# Patient Record
Sex: Female | Born: 1995 | Race: Black or African American | Hispanic: No | Marital: Single | Smoking: Never smoker
Health system: Southern US, Community
[De-identification: ages and names within clinical notes are randomized; demographics above are authoritative.]

## PROBLEM LIST (undated history)

## (undated) ENCOUNTER — Ambulatory Visit (HOSPITAL_COMMUNITY): Admission: EM

## (undated) DIAGNOSIS — K5900 Constipation, unspecified: Secondary | ICD-10-CM

## (undated) DIAGNOSIS — H50112 Monocular exotropia, left eye: Secondary | ICD-10-CM

## (undated) DIAGNOSIS — R51 Headache: Secondary | ICD-10-CM

## (undated) DIAGNOSIS — L309 Dermatitis, unspecified: Secondary | ICD-10-CM

## (undated) DIAGNOSIS — Z9229 Personal history of other drug therapy: Secondary | ICD-10-CM

## (undated) DIAGNOSIS — N912 Amenorrhea, unspecified: Secondary | ICD-10-CM

## (undated) DIAGNOSIS — H5010 Unspecified exotropia: Secondary | ICD-10-CM

## (undated) DIAGNOSIS — L509 Urticaria, unspecified: Secondary | ICD-10-CM

## (undated) DIAGNOSIS — R519 Headache, unspecified: Secondary | ICD-10-CM

## (undated) HISTORY — PX: FOOT SURGERY: SHX648

## (undated) HISTORY — PX: TONSILLECTOMY AND ADENOIDECTOMY: SUR1326

## (undated) HISTORY — DX: Urticaria, unspecified: L50.9

---
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
## 2014-01-10 ENCOUNTER — Emergency Department (HOSPITAL_COMMUNITY)
Admission: EM | Admit: 2014-01-10 | Discharge: 2014-01-10 | Disposition: A | Discharge status: Home / Self Care | Payer: Medicaid Other | Attending: Emergency Medicine | Admitting: Emergency Medicine

## 2014-01-10 ENCOUNTER — Encounter (HOSPITAL_COMMUNITY): Payer: Self-pay | Admitting: Emergency Medicine

## 2014-01-10 DIAGNOSIS — H9209 Otalgia, unspecified ear: Secondary | ICD-10-CM | POA: Diagnosis not present

## 2014-01-10 DIAGNOSIS — R05 Cough: Secondary | ICD-10-CM | POA: Insufficient documentation

## 2014-01-10 DIAGNOSIS — Z8719 Personal history of other diseases of the digestive system: Secondary | ICD-10-CM | POA: Insufficient documentation

## 2014-01-10 DIAGNOSIS — J029 Acute pharyngitis, unspecified: Secondary | ICD-10-CM | POA: Diagnosis not present

## 2014-01-10 DIAGNOSIS — Z880 Allergy status to penicillin: Secondary | ICD-10-CM | POA: Insufficient documentation

## 2014-01-10 DIAGNOSIS — Z79899 Other long term (current) drug therapy: Secondary | ICD-10-CM | POA: Diagnosis not present

## 2014-01-10 DIAGNOSIS — J3489 Other specified disorders of nose and nasal sinuses: Secondary | ICD-10-CM | POA: Diagnosis not present

## 2014-01-10 DIAGNOSIS — J45909 Unspecified asthma, uncomplicated: Secondary | ICD-10-CM | POA: Insufficient documentation

## 2014-01-10 DIAGNOSIS — R059 Cough, unspecified: Secondary | ICD-10-CM

## 2014-01-10 DIAGNOSIS — Z8742 Personal history of other diseases of the female genital tract: Secondary | ICD-10-CM | POA: Insufficient documentation

## 2014-01-10 DIAGNOSIS — R0981 Nasal congestion: Secondary | ICD-10-CM

## 2014-01-10 LAB — RAPID STREP SCREEN (MED CTR MEBANE ONLY): STREPTOCOCCUS, GROUP A SCREEN (DIRECT): NEGATIVE

## 2014-01-10 MED ORDER — PSEUDOEPHEDRINE HCL 60 MG PO TABS: 60.0000 mg | ORAL_TABLET | Freq: Three times a day (TID) | ORAL | Status: DC | PRN

## 2014-01-10 NOTE — ED Notes (Signed)
Pt reports that she has had a cough, congestion and sore throat for the 4 days. Also reports nasal congestion and sinus pressure.

## 2014-01-10 NOTE — ED Notes (Signed)
Pt c/o sore throat, cough and nasal congestion x 4 days.

## 2014-01-10 NOTE — ED Provider Notes (Signed)
CSN: [id number]     Arrival date & time 01/10/14  1321 History  This chart was scribed for non-physician practitioner Roxy Horseman, PA-C working with Vida Roller, MD by Leone Payor, ED Scribe. This patient was seen in room TR04C/TR04C and the patient's care was started at 3:47 PM.    Chief Complaint  Patient presents with  . Cough  . Otalgia  . Sore Throat    The history is provided by the patient. No language interpreter was used.    HPI Comments: Erika Frey is a 18 y.o. female who presents to the Emergency Department complaining of 1 week of gradual onset, gradually worsening, constant cough with associated congestion, rhinorrhea, sore throat, and bilateral otalgia. She reports similar symptoms in the past for which she has taken Singulair. She has taken it this time without relief. She denies fever.   Past Medical History  Diagnosis Date  . Asthma   . Absent menses     MOTHER STATES PT STARTED PERIOD AT AGE 89 BUT LAST PERIOD WAS 2 YRS AGO , AGE 76  . Exotropia of left eye   . Simple constipation     OCCASIONAL  . Sinus headache   . Immunizations up to date    Past Surgical History  Procedure Laterality Date  . Foot surgery  AGE 32  . Tonsillectomy and adenoidectomy  AGE 63  . Muscle recession and resection Left 12/16/2012    Procedure: LATERAL RECTUS RECESSION LEFT EYE;  Surgeon: Corinda Gubler, MD;  Location: [facility];  Service: Ophthalmology;  Laterality: Left;  . Median rectus repair Left 12/16/2012    Procedure: MEDIAN RECTUS RESECTION LEFT EYE;  Surgeon: Corinda Gubler, MD;  Location: [facility];  Service: Ophthalmology;  Laterality: Left;   History reviewed. No pertinent family history. History  Substance Use Topics  . Smoking status: Never Smoker   . Smokeless tobacco: Never Used     Comment: NO SMOKER IN HOME  . Alcohol Use: No   OB History   Grav Para Term Preterm Abortions TAB SAB Ect Mult Living                  Review of Systems  Constitutional: Negative for fever and chills.  HENT: Positive for congestion, ear pain, rhinorrhea, sinus pressure and sore throat. Negative for trouble swallowing.   Respiratory: Positive for cough. Negative for shortness of breath.   Cardiovascular: Negative for chest pain.  Gastrointestinal: Negative for nausea, vomiting, diarrhea and constipation.  Genitourinary: Negative for dysuria.      Allergies  Penicillins  Home Medications   Prior to Admission medications   Medication Sig Start Date End Date Taking? Authorizing Provider  albuterol (PROVENTIL HFA;VENTOLIN HFA) 108 (90 BASE) MCG/ACT inhaler Inhale 2 puffs into the lungs every 6 (six) hours as needed.    Historical Provider, MD  azithromycin (ZITHROMAX Z-PAK) 250 MG tablet Take 1 tablet (250 mg total) by mouth daily. For 4 more days 02/28/13   Wendi Maya, MD  montelukast (SINGULAIR) 10 MG tablet Take 10 mg by mouth at bedtime.    Historical Provider, MD   BP 140/64  Pulse 98  Temp(Src) 98 F (36.7 C) (Oral)  Resp 24  Ht  (1.676 m)  Wt 280 lb (127.007 kg)  BMI 45.21 kg/m2  SpO2 96%  LMP 12/23/2013 Physical Exam  Nursing note and vitals reviewed. Constitutional: She is oriented to person, place, and time. She appears well-developed  and well-nourished.  HENT:  Head: Normocephalic and atraumatic.  Nose: Right sinus exhibits maxillary sinus tenderness. Left sinus exhibits maxillary sinus tenderness.  Mouth/Throat: Uvula is midline and oropharynx is clear and moist. No oropharyngeal exudate, posterior oropharyngeal edema, posterior oropharyngeal erythema or tonsillar abscesses.  Bilateral TM's clear. Oropharynx is clear, no evidence of abscess. Uvula midline. Airway intact, mild tenderness to maxillary sinuses bilaterally.   Cardiovascular: Normal rate, regular rhythm and normal heart sounds.  Exam reveals no gallop and no friction rub.   No murmur heard. Pulmonary/Chest: Effort normal and  breath sounds normal. No respiratory distress. She has no wheezes. She has no rales.  Abdominal: She exhibits no distension.  Neurological: She is alert and oriented to person, place, and time.  Skin: Skin is warm and dry.  Psychiatric: She has a normal mood and affect.    ED Course  Procedures (including critical care time)  DIAGNOSTIC STUDIES: Oxygen Saturation is 96% on RA, adequate by my interpretation.    COORDINATION OF CARE: 3:51 PM Discussed treatment plan with pt at bedside and pt agreed to plan.   Results for orders placed during the hospital encounter of 01/10/14  RAPID STREP SCREEN      Result Value Ref Range   Streptococcus, Group A Screen (Direct) NEGATIVE  NEGATIVE   No results found.    EKG Interpretation None      MDM   Final diagnoses:  Sinus congestion  Cough    Patient with sinus congestion and cough.  No fever.  She looks well appearing.  Will treat symptomatically.  Recommend PCP follow-up.  Patient understands and agrees with the plan.  I personally performed the services described in this documentation, which was scribed in my presence. The recorded information has been reviewed and is accurate.    Roxy Horseman, PA-C 01/10/14 1624

## 2014-01-10 NOTE — Discharge Instructions (Signed)
Upper Respiratory Infection, Adult An upper respiratory infection (URI) is also known as the common cold. It is often caused by a type of germ (virus). Colds are easily spread (contagious). You can pass it to others by kissing, coughing, sneezing, or drinking out of the same glass. Usually, you get better in 1 or 2 weeks.  HOME CARE   Only take medicine as told by your doctor.  Use a warm mist humidifier or breathe in steam from a hot shower.  Drink enough water and fluids to keep your pee (urine) clear or pale yellow.  Get plenty of rest.  Return to work when your temperature is back to normal or as told by your doctor. You may use a face mask and wash your hands to stop your cold from spreading. GET HELP RIGHT AWAY IF:   After the first few days, you feel you are getting worse.  You have questions about your medicine.  You have chills, shortness of breath, or brown or red spit (mucus).  You have yellow or brown snot (nasal discharge) or pain in the face, especially when you bend forward.  You have a fever, puffy (swollen) neck, pain when you swallow, or white spots in the back of your throat.  You have a bad headache, ear pain, sinus pain, or chest pain.  You have a high-pitched whistling sound when you breathe in and out (wheezing).  You have a lasting cough or cough up blood.  You have sore muscles or a stiff neck. MAKE SURE YOU:   Understand these instructions.  Will watch your condition.  Will get help right away if you are not doing well or get worse. Document Released: 10/09/2007 Document Revised: 07/15/2011 Document Reviewed: 07/28/2013 [facility] Patient Information 2015 [place], [place]. This information is not intended to replace advice given to you by your health care provider. Make sure you discuss any questions you have with your health care provider.  Sinusitis Sinusitis is redness, soreness, and inflammation of the paranasal sinuses. Paranasal sinuses are air  pockets within the bones of your face (beneath the eyes, the middle of the forehead, or above the eyes). In healthy paranasal sinuses, mucus is able to drain out, and air is able to circulate through them by way of your nose. However, when your paranasal sinuses are inflamed, mucus and air can become trapped. This can allow bacteria and other germs to grow and cause infection. Sinusitis can develop quickly and last only a short time (acute) or continue over a long period (chronic). Sinusitis that lasts for more than 12 weeks is considered chronic.  CAUSES  Causes of sinusitis include:  Allergies.  Structural abnormalities, such as displacement of the cartilage that separates your nostrils (deviated septum), which can decrease the air flow through your nose and sinuses and affect sinus drainage.  Functional abnormalities, such as when the small hairs (cilia) that line your sinuses and help remove mucus do not work properly or are not present. SIGNS AND SYMPTOMS  Symptoms of acute and chronic sinusitis are the same. The primary symptoms are pain and pressure around the affected sinuses. Other symptoms include:  Upper toothache.  Earache.  Headache.  Bad breath.  Decreased sense of smell and taste.  A cough, which worsens when you are lying flat.  Fatigue.  Fever.  Thick drainage from your nose, which often is green and may contain pus (purulent).  Swelling and warmth over the affected sinuses. DIAGNOSIS  Your health care provider will perform  a physical exam. During the exam, your health care provider may:  Look in your nose for signs of abnormal growths in your nostrils (nasal polyps).  Tap over the affected sinus to check for signs of infection.  View the inside of your sinuses (endoscopy) using an imaging device that has a light attached (endoscope). If your health care provider suspects that you have chronic sinusitis, one or more of the following tests may be  recommended:  Allergy tests.  Nasal culture. A sample of mucus is taken from your nose, sent to a lab, and screened for bacteria.  Nasal cytology. A sample of mucus is taken from your nose and examined by your health care provider to determine if your sinusitis is related to an allergy. TREATMENT  Most cases of acute sinusitis are related to a viral infection and will resolve on their own within 10 days. Sometimes medicines are prescribed to help relieve symptoms (pain medicine, decongestants, nasal steroid sprays, or saline sprays).  However, for sinusitis related to a bacterial infection, your health care provider will prescribe antibiotic medicines. These are medicines that will help kill the bacteria causing the infection.  Rarely, sinusitis is caused by a fungal infection. In theses cases, your health care provider will prescribe antifungal medicine. For some cases of chronic sinusitis, surgery is needed. Generally, these are cases in which sinusitis recurs more than 3 times per year, despite other treatments. HOME CARE INSTRUCTIONS   Drink plenty of water. Water helps thin the mucus so your sinuses can drain more easily.  Use a humidifier.  Inhale steam 3 to 4 times a day (for example, sit in the bathroom with the shower running).  Apply a warm, moist washcloth to your face 3 to 4 times a day, or as directed by your health care provider.  Use saline nasal sprays to help moisten and clean your sinuses.  Take medicines only as directed by your health care provider.  If you were prescribed either an antibiotic or antifungal medicine, finish it all even if you start to feel better. SEEK IMMEDIATE MEDICAL CARE IF:  You have increasing pain or severe headaches.  You have nausea, vomiting, or drowsiness.  You have swelling around your face.  You have vision problems.  You have a stiff neck.  You have difficulty breathing. MAKE SURE YOU:   Understand these  instructions.  Will watch your condition.  Will get help right away if you are not doing well or get worse. Document Released: 04/22/2005 Document Revised: 09/06/2013 Document Reviewed: 05/07/2011 [facility] Patient Information 2015 [place], [place]. This information is not intended to replace advice given to you by your health care provider. Make sure you discuss any questions you have with your health care provider.

## 2014-01-12 LAB — CULTURE, GROUP A STREP

## 2014-01-12 NOTE — ED Provider Notes (Signed)
Medical screening examination/treatment/procedure(s) were performed by non-physician practitioner and as supervising physician I was immediately available for consultation/collaboration.    Philander Ake D Hafsah Hendler, MD 01/12/14 2317 

## 2014-06-13 DIAGNOSIS — M722 Plantar fascial fibromatosis: Secondary | ICD-10-CM

## 2014-07-26 IMAGING — CR DG CHEST 2V
2 series · 2 of 2 positions shown · non-contrast
Comparison: 08/23/2006.

CLINICAL DATA: Cough. Congestion.

EXAM:
CHEST  2 VIEW

[w chest pa *]
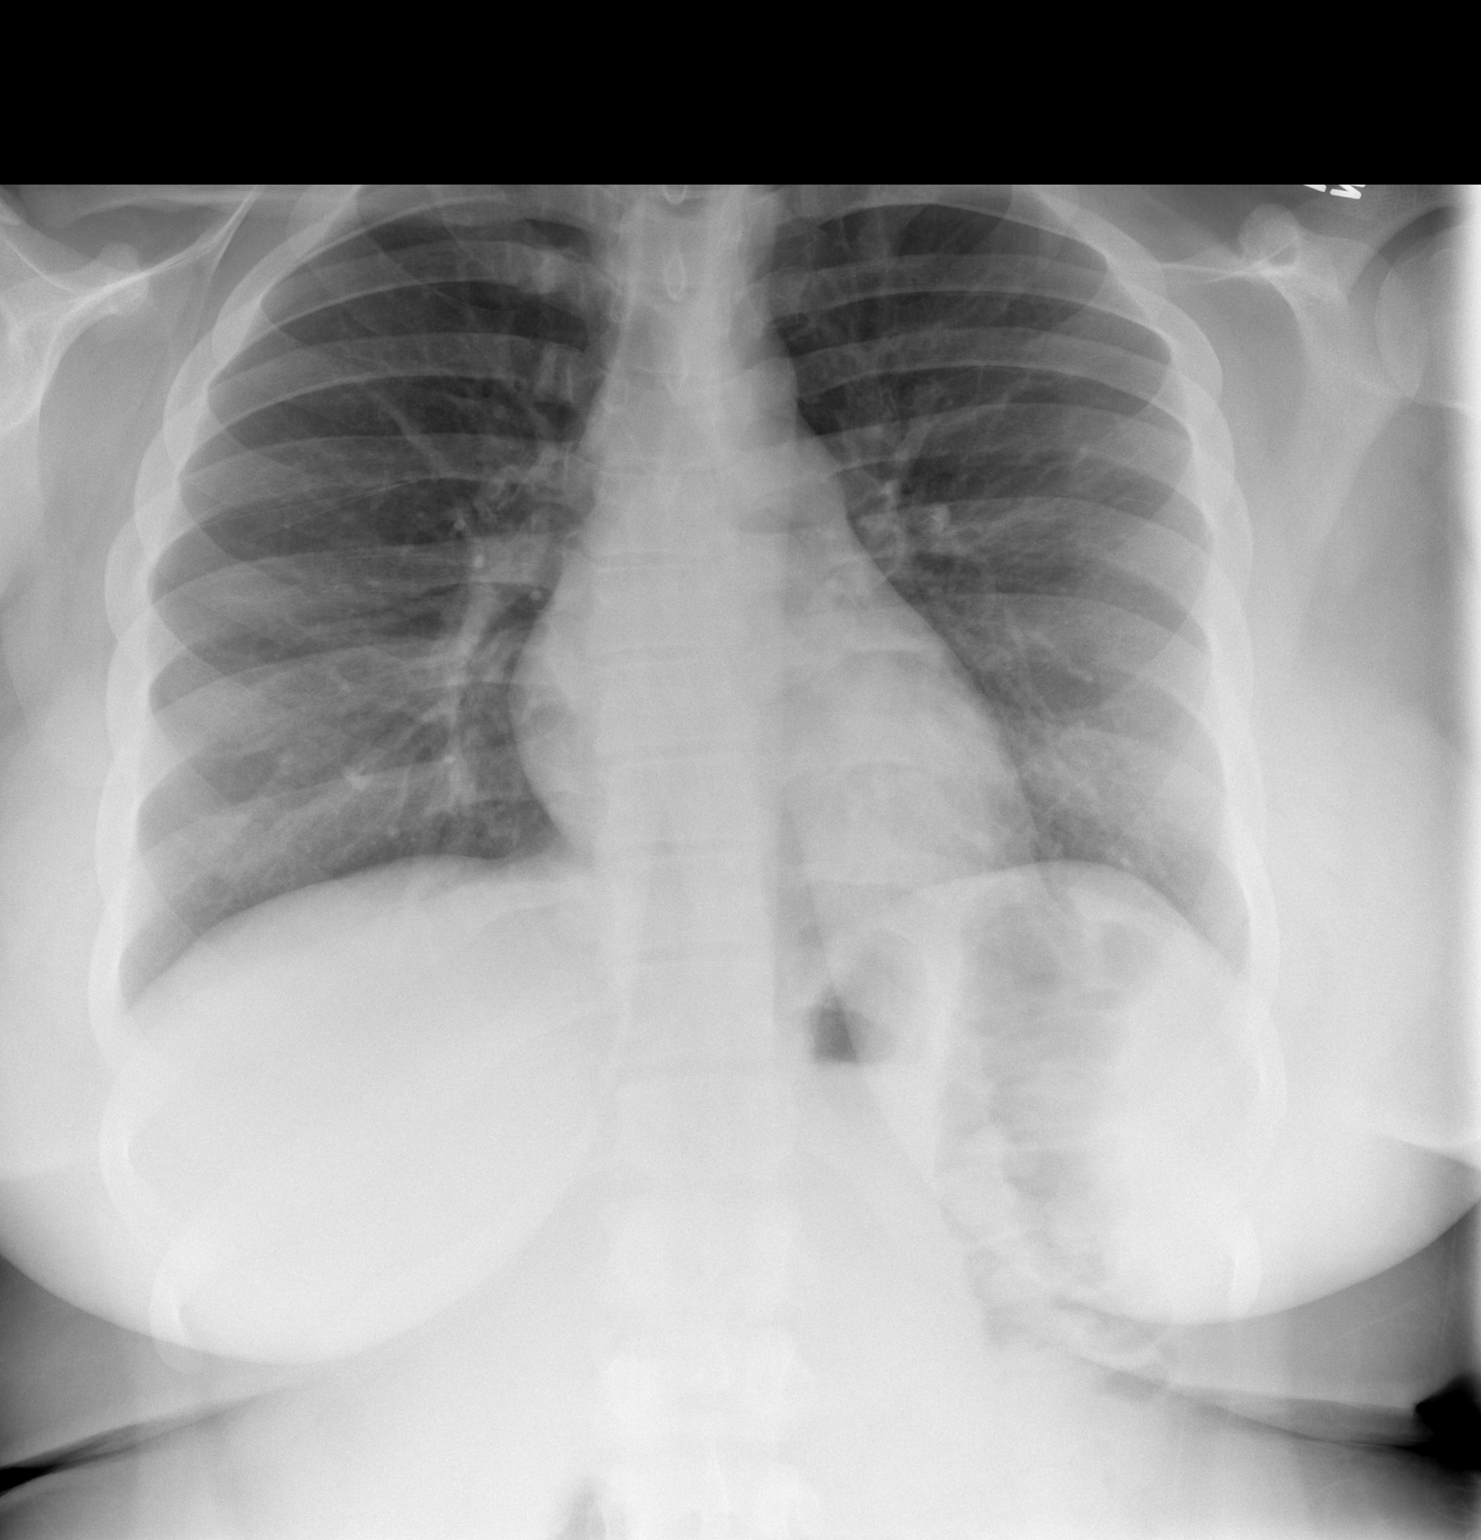

[w chest lat *]
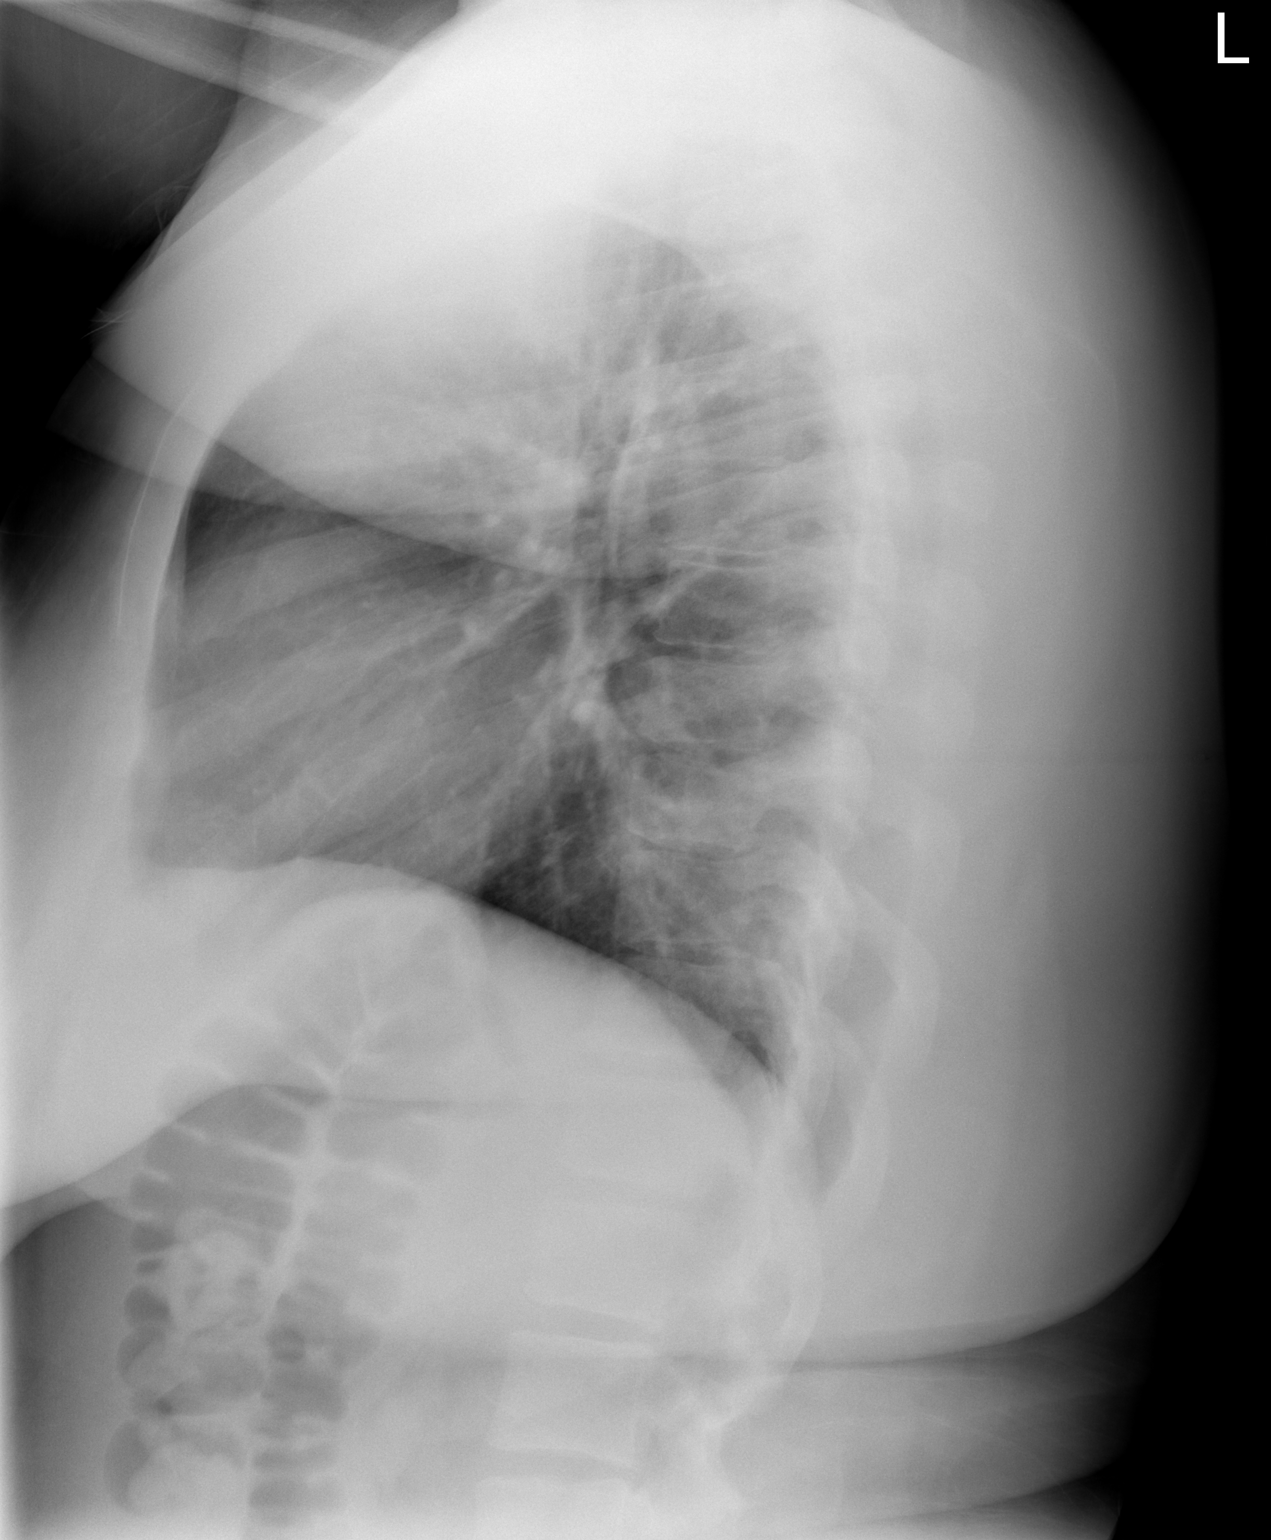

[2 of 2 positions shown; findings below may reference images not displayed]

FINDINGS: On the lateral view, there is increased density over lower thoracic
spine. On the frontal view, this corresponds with airspace disease
in the retrocardiac region. The cardiopericardial silhouette appears
within normal limits. Mediastinal contours are normal.
IMPRESSION: Left lower lobe pneumonia.

## 2014-11-09 ENCOUNTER — Ambulatory Visit (INDEPENDENT_AMBULATORY_CARE_PROVIDER_SITE_OTHER): Payer: Medicaid Other

## 2014-11-09 ENCOUNTER — Encounter: Payer: Self-pay | Admitting: Podiatry

## 2014-11-09 ENCOUNTER — Ambulatory Visit (INDEPENDENT_AMBULATORY_CARE_PROVIDER_SITE_OTHER): Payer: Medicaid Other | Admitting: Podiatry

## 2014-11-09 VITALS — BP 108/63 | HR 88 | Temp 98.0°F | Resp 16 | Ht 66.0 in | Wt 316.0 lb

## 2014-11-09 DIAGNOSIS — Q6650 Congenital pes planus, unspecified foot: Secondary | ICD-10-CM

## 2014-11-09 DIAGNOSIS — R52 Pain, unspecified: Secondary | ICD-10-CM

## 2014-11-09 NOTE — Progress Notes (Signed)
   Subjective:    Patient ID: Erika Frey, female    DOB: [birth year][place]1/25/1997, 19 y.o.   MRN: [id number]  HPI  N- sharpe L-B/L ankle pain,cramping in toes D- all of her life O-gradual C-worse A-standing T-rubs in green alcohol, heat, epsom salt and massaging but nothing helps.  Patient presents here today with B/L ankle pain and cramping of the toes, all of her life. She had surgery at age 19 on ankles then it started to hurt and got worse as she got older.  Patient describes generalized foot and ankle pain associated with weightbearing after about 20 minutes. She's tried a variety shoeing without any relief of the symptoms. 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  She describes her weight bearing between 316 pounds plus or -6-8 over a long period of time  Review of Systems  HENT:       Sinus problems  Eyes: Positive for itching.  Cardiovascular: Positive for leg swelling.       Calf pain with walking  Musculoskeletal: Positive for gait problem.  Skin:       Thick scars  Neurological: Positive for light-headedness and headaches.  Hematological: Bruises/bleeds easily.       Slow to heal       Objective:   Physical Exam  Patient is orientated 3 Patient's mother and siblings are present treatment room today  Vascular: No peripheral edema noted bilaterally DP and PT pulses 2/4 bilaterally Capillary reflex immediate bilaterally  Neurological: Ankle reflex equal and reactive bilaterally Sensation to 10 g monofilament wire intact 5/5 bilaterally  Dermatological: Well-healed surgical scars posterior tendo Achilles bilaterally Texture and turgor within normal limits bilaterally No skin lesions noted bilaterally  Musculoskeletal: Dorsi flexion and plantar flexion 90 with knee flexed and extended Upon weight-bearing tibial varum noted bilaterally Pes planus bilaterally Palpable tenderness at tendo Achilles bilaterally  without any obvious palpable defects       Assessment & Plan:   Assessment: 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 I am recommending that she have further evaluation and treatment with Dr. Loreta AveWagner.  Reschedule patient for consult and further evaluation with Dr. Loreta AveWagner at next visit

## 2014-12-05 ENCOUNTER — Encounter: Payer: Self-pay | Admitting: Podiatry

## 2014-12-05 ENCOUNTER — Ambulatory Visit (INDEPENDENT_AMBULATORY_CARE_PROVIDER_SITE_OTHER): Payer: Medicaid Other | Admitting: Podiatry

## 2014-12-05 VITALS — BP 108/58 | HR 86 | Resp 14

## 2014-12-05 DIAGNOSIS — M216X1 Other acquired deformities of right foot: Secondary | ICD-10-CM | POA: Diagnosis not present

## 2014-12-05 DIAGNOSIS — M216X2 Other acquired deformities of left foot: Secondary | ICD-10-CM | POA: Diagnosis not present

## 2014-12-05 DIAGNOSIS — Q669 Congenital deformity of feet, unspecified, unspecified foot: Secondary | ICD-10-CM

## 2014-12-05 DIAGNOSIS — R52 Pain, unspecified: Secondary | ICD-10-CM

## 2014-12-05 NOTE — Progress Notes (Signed)
Patient ID: Erika Frey, female   DOB: 08-16-1995, 19 y.o.   MRN: [id number]  Subjective: 19 year old female presents the operative her mom for concerns of Achilles tendon pain as well as flatfoot deformity. She states that when she was 19 years of age she had her Achilles tendon lengthened as she was walking her toes. She states that afterwards she did go to physical therapy as a child however she has not recently. She states that she has gotten older she started to have tightness again the tendon and her heel does not completely touch the ground. She also states that she does have some pain in the arch of her foot as well protective weightbearing and standing for prolonged period of time. She denies any history of injury or trauma. Denies any swelling or redness. No tenderness. No other complaints at this time.  Objective: AAO x3, NAD DP/PT pulses palpable bilaterally, CRT less than 3 seconds Protective sensation intact with Simms Weinstein monofilament, vibratory sensation intact, Achilles tendon reflex intact Nonweightbearing exam reveals equinus is present and there is tightness of the Achilles tendon upon dorsiflexion of the ankle. There is a scar overlying the area from prior surgery with slight tenderness overlying the area. Ankle, subtalar joint, midtarsal joint range of motion is intact without any pain. There is no specific area pinpoint bony tenderness or pain the vibratory sensation. Subjectively there is some tenderness on the medial band plantar fascia at times however she is not having pain at this time. There is no overlying edema, erythema, increase in warmth bilaterally. Weightbearing exam reveals a decrease in medial arch height with calcaneal valgus and forefoot abduction. Upon standing the left heel sits slightly off the ground. Gait evaluation reveals excessive pronation. MMT 5/5, ROM WNL.  No open lesions or pre-ulcerative lesions.  No overlying edema, erythema, increase in warmth  to bilateral lower extremities.  No pain with calf compression, swelling, warmth, erythema bilaterally.   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 -I discussed both conservative and surgical treatment options. I believe that she would be a good candidate for physical therapy to help stretch the Achilles tendon. I also discussed conservative treatment including orthotics. She would continue these. She appears he had orthotics have they were not fitting since she never wore them. A prescription for orthotics for biotech were given to the patient as well as a prescription for physical therapy for breakthrough. -Briefly discussed surgery however they wish to hold off on that at this time. -Follow-up after orthotics/physical therapy or sooner if any problems are to arise. In the meantime call the office with any questions, concerns, changes symptoms.  Ovid Curd, DPM

## 2014-12-09 ENCOUNTER — Telehealth: Payer: Self-pay | Admitting: *Deleted

## 2014-12-09 DIAGNOSIS — R52 Pain, unspecified: Secondary | ICD-10-CM

## 2014-12-09 NOTE — Telephone Encounter (Signed)
Pt's mtr, Gladis Riffle states the PT Dr. Ardelle Anton referred pt to does not take Medicaid.  Dr. Ardelle Anton ordered PT to [facility].

## 2015-01-02 ENCOUNTER — Ambulatory Visit: Payer: Medicaid Other | Attending: Podiatry | Admitting: Physical Therapy

## 2015-01-02 DIAGNOSIS — M67879 Other specified disorders of synovium and tendon, unspecified ankle and foot: Secondary | ICD-10-CM | POA: Diagnosis present

## 2015-01-02 DIAGNOSIS — R269 Unspecified abnormalities of gait and mobility: Secondary | ICD-10-CM | POA: Diagnosis present

## 2015-01-02 DIAGNOSIS — M7660 Achilles tendinitis, unspecified leg: Secondary | ICD-10-CM

## 2015-01-02 NOTE — Therapy (Signed)
[facility] Outpatient Rehabilitation [facility] [address] [place], [place], [address] Phone: [phone]   Fax:  [phone]  Physical Therapy Evaluation  Patient Details  Name: Erika Frey MRN: [id number] Date of Birth: 10-23-1995 Referring Provider:  Vivi Barrack, DPM  Encounter Date: 01/02/2015      PT End of Session - 01/02/15 1803    Visit Number 1   Number of Visits 12   Date for PT Re-Evaluation 02/13/15   Authorization Type Medicaid   PT Start Time 1500   PT Stop Time 1545   PT Time Calculation (min) 45 min   Activity Tolerance Patient tolerated treatment well   Behavior During Therapy [facility] for tasks assessed/performed      Past Medical History  Diagnosis Date  . Asthma   . Absent menses     MOTHER STATES PT STARTED PERIOD AT AGE 10 BUT LAST PERIOD WAS 2 YRS AGO , AGE [age]  . Exotropia of left eye   . Simple constipation     OCCASIONAL  . Sinus headache   . Immunizations up to date     Past Surgical History  Procedure Laterality Date  . Foot surgery  AGE 10  . Tonsillectomy and adenoidectomy  AGE 73  . Muscle recession and resection Left 12/16/2012    Procedure: LATERAL RECTUS RECESSION LEFT EYE;  Surgeon: Corinda Gubler, MD;  Location: [facility];  Service: Ophthalmology;  Laterality: Left;  . Median rectus repair Left 12/16/2012    Procedure: MEDIAN RECTUS RESECTION LEFT EYE;  Surgeon: Corinda Gubler, MD;  Location: [facility];  Service: Ophthalmology;  Laterality: Left;    There were no vitals filed for this visit.  Visit Diagnosis:  Achilles tendon pain - Plan: PT plan of care cert/re-cert  Abnormality of gait - Plan: PT plan of care cert/re-cert      Subjective Assessment - 01/02/15 1510    Subjective pt is a 19 y.o F with CC of bil achilles pain that has been going on for a long time. pt has a history of "toe walking". and had surgery to clip a couple of ligaments and tendons. she  reports that the pain stays the same since onset.    Limitations Walking;Standing   How long can you sit comfortably? unlimted   How long can you stand comfortably? 45 - 60 min   How long can you walk comfortably? 45 - 60 min   Diagnostic tests 11/19/2014 per pt report that he still has some issues.    Patient Stated Goals to decrease pain, to stretch the calves   Currently in Pain? Yes   Pain Score 1   after 45- 60 min 9-10/10   Pain Location Ankle   Pain Orientation Right;Left   Pain Descriptors / Indicators Cramping;Sharp;Sore;Spasm   Pain Type Chronic pain   Pain Onset More than a month ago   Pain Frequency Constant   Aggravating Factors  prolonged standing/ walking, stairs   Pain Relieving Factors massage, green alcohol, epsom salt            [facility] PT Assessment - 01/02/15 1517    Assessment   Medical Diagnosis bil achilles pain   Onset Date/Surgical Date --  been going on for a while   Hand Dominance Right   Next MD Visit make one as needed   Prior Therapy no   Precautions   Precautions None   Restrictions   Weight Bearing Restrictions No  Balance Screen   Has the patient fallen in the past 6 months No   Has the patient had a decrease in activity level because of a fear of falling?  No   Is the patient reluctant to leave their home because of a fear of falling?  No   Home Environment   Living Environment Private residence   Living Arrangements Parent   Available Help at Discharge Available 24 hours/day;Available PRN/intermittently   Type of Home Apartment   Home Access Stairs to enter   Entrance Stairs-Number of Steps 4   Entrance Stairs-Rails Can reach both   Home Layout Two level   Alternate Level Stairs-Number of Steps 7   Prior Function   Level of Independence Independent;Independent with basic ADLs   Vocation Part time employment  hair dresser   Vocation Requirements prolonged standing   Leisure shopping, hanging out with friends, go to the movies.    Cognition   Overall Cognitive Status Within Functional Limits for tasks assessed   Observation/Other Assessments   Lower Extremity Functional Scale  57/80   Posture/Postural Control   Posture/Postural Control Postural limitations   Postural Limitations Forward head;Rounded Shoulders   ROM / Strength   AROM / PROM / Strength AROM;Strength;PROM   AROM   AROM Assessment Site Ankle   Right/Left Ankle Right;Left   Right Ankle Dorsiflexion -2   Right Ankle Plantar Flexion 45   Right Ankle Inversion 22   Right Ankle Eversion 12   Left Ankle Dorsiflexion 1   Left Ankle Plantar Flexion 42   Left Ankle Inversion 12   Left Ankle Eversion 8   PROM   Overall PROM Comments R DF 4, PF 48, Eversion 16, Inversion28   PROM Assessment Site Ankle   Right/Left Ankle Right;Left   Left Ankle Dorsiflexion 5  pain and tightness at end rnage   Left Ankle Plantar Flexion 45   Left Ankle Inversion 28   Left Ankle Eversion 12   Strength   Strength Assessment Site Ankle   Right/Left Ankle Right;Left   Right Ankle Dorsiflexion 5/5   Right Ankle Plantar Flexion 5/5   Right Ankle Inversion 5/5   Right Ankle Eversion 5/5   Left Ankle Dorsiflexion 5/5   Left Ankle Plantar Flexion 5/5   Left Ankle Inversion 5/5   Left Ankle Eversion 5/5   Special Tests    Special Tests Ankle/Foot Special Tests   Ankle/Foot Special Tests  Thompson's Test;Talar Tilt Test;Kleiger Test;Anterior Drawer Test;Tibial Torsion Test   Tibial Torsion Test   Findings Negative   Anterior Drawer Test   Findings Negative   Talar Tilt Test    Findings Negative   Kleiger Test   Findings Negative   Thompson's Test   Findings Negative                           PT Education - 01/02/15 1802    Education provided Yes   Education Details evaluation findings, POC, Goals, HEP, anatomy education   Person(s) Educated Patient   Methods Explanation   Comprehension Verbalized understanding          PT Short Term  Goals - 01/02/15 1808    PT SHORT TERM GOAL #1   Title pt will be I with inital HEP (01/23/2015)   Baseline no previous HEP   Time 3   Period Weeks   Status New   PT SHORT TERM GOAL #2   Title pt will  decrease calf tightness to help promote increased dorsiflexion bil by atleast 4 degrees to assist with gait efficiency (01/23/2015)   Baseline R dorsiflexion -2 degress, L dorsiflexion 1 degree   Time 3   Period Weeks   Status New           PT Long Term Goals - 01/02/15 1809    PT LONG TERM GOAL #1   Title At discharge pt will be I with all HEP given throughout therapy (02/13/15)   Baseline No previous HEP   Time 6   Period Weeks   Status New   PT LONG TERM GOAL #2   Title pt will be able to stand/walk for > 1 hour with < 3/10 pain to assist with work related requirments (02/13/15)   Baseline stand/ walk for 45-60 min with 9-10 /10 pain   Time 6   Period Weeks   Status New   PT LONG TERM GOAL #3   Title She will demonstrate increased ankle mobility to [facility] in all planes to assist with safety during gait and decreased chances of potentially tripping (02/13/15)   Baseline R dorsiflexon -2, L dorsiflexion 1   Time 6   Period Weeks   Status New   PT LONG TERM GOAL #4   Title Pt will be able to verbalize and demonstrate techniques to reduce bil achilles pain and tightness via RICE, stretching and HEP   Baseline no previous method of controlling pain and tightness   Time 6   Period Weeks   Status New   PT LONG TERM GOAL #5   Title pt will her LEFS score by > 10 points to assist with improved functional capabiltys at discharge (02/13/15)   Baseline initall LEFS  57/80               Plan - 01/02/15 1803    Clinical Impression Statement Jamica present to OPPT with CC of bil achilles pain with the L>R. She demonstrates limited Dorsiflexion bil with all other motions WFL. MMT was WNL. Palpation revealed signifcant tightness of bil achilles and tightness of the triceps surae  bil. She exhibits an antalgic gait pattern with pes planus, bil with increased Valgus bil. She reports having a history of toe walking and surgerys to cut and lengthen tendons. She would benefit from skilled physical therapy to maxmize her function by addressing the impairments listed.    Pt will benefit from skilled therapeutic intervention in order to improve on the following deficits Abnormal gait;Decreased activity tolerance;Decreased balance;Decreased endurance;Increased muscle spasms;Pain;Obesity;Hypomobility;Impaired flexibility   Rehab Potential Good   PT Frequency 2x / week   PT Duration 6 weeks   PT Treatment/Interventions ADLs/Self Care Home Management;Cryotherapy;Electrical Stimulation;Iontophoresis 4mg /ml Dexamethasone;Moist Heat;Ultrasound;Gait training;Stair training;Therapeutic activities;Therapeutic exercise;Balance training;Neuromuscular re-education;Patient/family education;Manual techniques;Dry needling;Taping   PT Next Visit Plan assess response to HEP, calf stretching, foot instrinsic strengthening, manual for calf tightness, Educate on dry needling?   PT Home Exercise Plan standing/ seated calf stretches, towel scrunches   Consulted and Agree with Plan of Care Patient         Problem List Patient Active Problem List   Diagnosis Date Noted  . Exotropia of left eye 12/15/2012   Lulu Riding PT, DPT, LAT, ATC  01/02/2015  6:19 PM      [facility] Outpatient Rehabilitation [facility] [address] [place], [place], [address] Phone: [phone]   Fax:  [phone]

## 2015-01-02 NOTE — Patient Instructions (Signed)
   Christin Moline PT, DPT, LAT, ATC  [facility] Phone: [phone]     

## 2015-01-16 ENCOUNTER — Ambulatory Visit: Payer: Medicaid Other | Attending: Podiatry | Admitting: Physical Therapy

## 2015-01-16 DIAGNOSIS — M67879 Other specified disorders of synovium and tendon, unspecified ankle and foot: Secondary | ICD-10-CM | POA: Insufficient documentation

## 2015-01-16 DIAGNOSIS — R269 Unspecified abnormalities of gait and mobility: Secondary | ICD-10-CM | POA: Diagnosis present

## 2015-01-16 DIAGNOSIS — M7660 Achilles tendinitis, unspecified leg: Secondary | ICD-10-CM

## 2015-01-16 NOTE — Therapy (Signed)
[facility] Outpatient Rehabilitation [facility] [address] [place], [place], [address] Phone: [phone]   Fax:  [phone]  Physical Therapy Treatment  Patient Details  Name: Erika Frey MRN: [id number] Date of Birth: 22-Aug-1995 Referring Provider:  Vivi Barrack, DPM  Encounter Date: 01/16/2015      PT End of Session - 01/16/15 1613    Visit Number 2   Number of Visits 12   Date for PT Re-Evaluation 02/13/15   Authorization Type Medicaid   PT Start Time 0300   PT Stop Time 0345   PT Time Calculation (min) 45 min      Past Medical History  Diagnosis Date  . Asthma   . Absent menses     MOTHER STATES PT STARTED PERIOD AT AGE 19 BUT LAST PERIOD WAS 2 YRS AGO , AGE 72  . Exotropia of left eye   . Simple constipation     OCCASIONAL  . Sinus headache   . Immunizations up to date     Past Surgical History  Procedure Laterality Date  . Foot surgery  AGE 46  . Tonsillectomy and adenoidectomy  AGE 3  . Muscle recession and resection Left 12/16/2012    Procedure: LATERAL RECTUS RECESSION LEFT EYE;  Surgeon: Corinda Gubler, MD;  Location: [facility];  Service: Ophthalmology;  Laterality: Left;  . Median rectus repair Left 12/16/2012    Procedure: MEDIAN RECTUS RESECTION LEFT EYE;  Surgeon: Corinda Gubler, MD;  Location: [facility];  Service: Ophthalmology;  Laterality: Left;    There were no vitals filed for this visit.  Visit Diagnosis:  Achilles tendon pain  Abnormality of gait      Subjective Assessment - 01/16/15 1519    Subjective Pain 8/10 in AM, 4/10 at least, and 10/10 with 30-45 minutes walking    Currently in Pain? Yes   Pain Score 4    Pain Location Ankle   Pain Orientation Right;Left   Pain Descriptors / Indicators Cramping;Spasm   Aggravating Factors  standing/walking   Pain Relieving Factors massage, green alcohol , epsom salt                         [facility] Adult PT  Treatment/Exercise - 01/16/15 1602    Exercises   Exercises Ankle   Modalities   Modalities Ultrasound   Ultrasound   Ultrasound Location Bil achilles   Ultrasound Parameters 100% 1.6 w/cm2 during passive DF stretch   Ultrasound Goals Pain;Other (Comment)  flexibility   Manual Therapy   Manual Therapy Soft tissue mobilization   Soft tissue mobilization bilateral gastroc soft tissue work using IASTYM tool -multiple points of spasm noted   Ankle Exercises: Stretches   Gastroc Stretch 3 reps;30 seconds   Gastroc Stretch Limitations at edge of step bilateral and uniateral                  PT Short Term Goals - 01/02/15 1808    PT SHORT TERM GOAL #1   Title pt will be I with inital HEP (01/23/2015)   Baseline no previous HEP   Time 3   Period Weeks   Status New   PT SHORT TERM GOAL #2   Title pt will decrease calf tightness to help promote increased dorsiflexion bil by atleast 4 degrees to assist with gait efficiency (01/23/2015)   Baseline R dorsiflexion -2 degress, L dorsiflexion 1 degree   Time 3  Period Weeks   Status New           PT Long Term Goals - 01/02/15 1809    PT LONG TERM GOAL #1   Title At discharge pt will be I with all HEP given throughout therapy (02/13/15)   Baseline No previous HEP   Time 6   Period Weeks   Status New   PT LONG TERM GOAL #2   Title pt will be able to stand/walk for > 1 hour with < 3/10 pain to assist with work related requirments (02/13/15)   Baseline stand/ walk for 45-60 min with 9-10 /10 pain   Time 6   Period Weeks   Status New   PT LONG TERM GOAL #3   Title She will demonstrate increased ankle mobility to [facility] in all planes to assist with safety during gait and decreased chances of potentially tripping (02/13/15)   Baseline R dorsiflexon -2, L dorsiflexion 1   Time 6   Period Weeks   Status New   PT LONG TERM GOAL #4   Title Pt will be able to verbalize and demonstrate techniques to reduce bil achilles pain  and tightness via RICE, stretching and HEP   Baseline no previous method of controlling pain and tightness   Time 6   Period Weeks   Status New   PT LONG TERM GOAL #5   Title pt will her LEFS score by > 10 points to assist with improved functional capabiltys at discharge (02/13/15)   Baseline initall LEFS  57/80               Plan - 01/16/15 1605    Clinical Impression Statement Ultrasound used for flexibility and pain to bil achilles tendns while passively stretched in prone. Followed this with soft tissue work to Corporate treasurer with multiple trigger points palpated. Instructed pt in standing gastroc stretch on edge of step. Pt reports decreased pain and  "it feels better" post treatment. Encouraged pt to have mom massage prior to stretching at home.    PT Next Visit Plan review HEP, heel hand stretch, continue manual and ultrasound. measure ROM,  Pt is receptive to dry needling- have pt get on dry needling schedule for future.         Problem List Patient Active Problem List   Diagnosis Date Noted  . Exotropia of left eye 12/15/2012    Sherrie Mustache, PTA 01/16/2015, 4:14 PM  [facility] [address] [place], [place], [address] Phone: [phone]   Fax:  [phone]

## 2015-01-18 ENCOUNTER — Ambulatory Visit: Payer: Medicaid Other | Admitting: Physical Therapy

## 2015-01-18 DIAGNOSIS — R269 Unspecified abnormalities of gait and mobility: Secondary | ICD-10-CM

## 2015-01-18 DIAGNOSIS — M67879 Other specified disorders of synovium and tendon, unspecified ankle and foot: Secondary | ICD-10-CM | POA: Diagnosis not present

## 2015-01-18 DIAGNOSIS — M7660 Achilles tendinitis, unspecified leg: Secondary | ICD-10-CM

## 2015-01-18 NOTE — Therapy (Signed)
[facility] Outpatient Rehabilitation [facility] [address] [place], [place], [address] Phone: [phone]   Fax:  [phone]  Physical Therapy Treatment  Patient Details  Name: Erika Frey MRN: [id number] Date of Birth: March 15, 1996 Referring Provider:  Vivi Barrack, DPM  Encounter Date: 01/18/2015      PT End of Session - 01/18/15 1655    Visit Number 3   Number of Visits 12   Date for PT Re-Evaluation 02/13/15   PT Start Time 1504   PT Stop Time 1545   PT Time Calculation (min) 41 min   Activity Tolerance Patient tolerated treatment well;No increased pain   Behavior During Therapy [facility] for tasks assessed/performed      Past Medical History  Diagnosis Date  . Asthma   . Absent menses     MOTHER STATES PT STARTED PERIOD AT AGE 19 BUT LAST PERIOD WAS 2 YRS AGO , AGE 19  . Exotropia of left eye   . Simple constipation     OCCASIONAL  . Sinus headache   . Immunizations up to date     Past Surgical History  Procedure Laterality Date  . Foot surgery  AGE 19  . Tonsillectomy and adenoidectomy  AGE 19  . Muscle recession and resection Left 12/16/2012    Procedure: LATERAL RECTUS RECESSION LEFT EYE;  Surgeon: Corinda Gubler, MD;  Location: [facility];  Service: Ophthalmology;  Laterality: Left;  . Median rectus repair Left 12/16/2012    Procedure: MEDIAN RECTUS RESECTION LEFT EYE;  Surgeon: Corinda Gubler, MD;  Location: [facility];  Service: Ophthalmology;  Laterality: Left;    There were no vitals filed for this visit.  Visit Diagnosis:  Abnormality of gait  Achilles tendon pain      Subjective Assessment - 01/18/15 1702    Subjective 4/10  worse 1st thing in am.  She is doing her home exercises   Currently in Pain? Yes   Pain Score 4    Pain Location Calf   Pain Orientation Right;Left   Pain Descriptors / Indicators Cramping;Spasm   Pain Radiating Towards calf/ proximal   Pain Frequency Constant   Aggravating Factors  standing, walking, worse 1st thing in am.   Pain Relieving Factors elevation of legs sometimes.                         [facility] Adult PT Treatment/Exercise - 01/18/15 1525    Modalities   Modalities Ultrasound   Ultrasound   Ultrasound Location both achilles   Ultrasound Parameters 1.6 watts/cm2, 3.3 mgHz, 8 minutes each   Ultrasound Goals Pain   Manual Therapy   Manual Therapy Soft tissue mobilization   Manual therapy comments retrograde, stretching, ROCK tool and manual   gastroc and soleus.                    PT Short Term Goals - 01/18/15 1658    PT SHORT TERM GOAL #1   Title pt will be I with inital HEP (01/23/2015)   Baseline doing at home.   Time 3   Period Weeks   Status On-going   PT SHORT TERM GOAL #2   Title pt will decrease calf tightness to help promote increased dorsiflexion bil by atleast 4 degrees to assist with gait efficiency (01/23/2015)   Baseline R dorsiflexion -2 degress, L dorsiflexion 1 degree   Time 3   Period Weeks   Status  On-going           PT Long Term Goals - 01/02/15 1809    PT LONG TERM GOAL #1   Title At discharge pt will be I with all HEP given throughout therapy (02/13/15)   Baseline No previous HEP   Time 6   Period Weeks   Status New   PT LONG TERM GOAL #2   Title pt will be able to stand/walk for > 1 hour with < 3/10 pain to assist with work related requirments (02/13/15)   Baseline stand/ walk for 45-60 min with 9-10 /10 pain   Time 6   Period Weeks   Status New   PT LONG TERM GOAL #3   Title She will demonstrate increased ankle mobility to [facility] in all planes to assist with safety during gait and decreased chances of potentially tripping (02/13/15)   Baseline R dorsiflexon -2, L dorsiflexion 1   Time 6   Period Weeks   Status New   PT LONG TERM GOAL #4   Title Pt will be able to verbalize and demonstrate techniques to reduce bil achilles pain and tightness via RICE, stretching  and HEP   Baseline no previous method of controlling pain and tightness   Time 6   Period Weeks   Status New   PT LONG TERM GOAL #5   Title pt will her LEFS score by > 10 points to assist with improved functional capabiltys at discharge (02/13/15)   Baseline initall LEFS  57/80               Plan - 01/18/15 1656    Clinical Impression Statement ROM unchanges.  less pain with session.    PT Next Visit Plan review HEP, heel hand stretch, continue manual and ultrasound. measure ROM,  Pt is receptive to dry needling- have pt get on dry needling schedule for future. May tape anterior tib to assist DF.  gastroc for pain.   PT Home Exercise Plan calf stretches.   Consulted and Agree with Plan of Care Patient        Problem List Patient Active Problem List   Diagnosis Date Noted  . Exotropia of left eye 12/15/2012    [facility] 01/18/2015, 5:04 PM  [facility] [address] [place], [place], [address] Phone: [phone]   Fax:  [phone]     Liz Beach, PTA 01/18/2015 5:04 PM Phone: [phone] Fax: [phone]

## 2015-01-23 ENCOUNTER — Ambulatory Visit: Payer: Medicaid Other | Admitting: Physical Therapy

## 2015-01-23 DIAGNOSIS — M7660 Achilles tendinitis, unspecified leg: Secondary | ICD-10-CM

## 2015-01-23 DIAGNOSIS — M67879 Other specified disorders of synovium and tendon, unspecified ankle and foot: Secondary | ICD-10-CM | POA: Diagnosis not present

## 2015-01-23 DIAGNOSIS — R269 Unspecified abnormalities of gait and mobility: Secondary | ICD-10-CM

## 2015-01-23 NOTE — Patient Instructions (Signed)
Remove tape if irritating 

## 2015-01-23 NOTE — Therapy (Signed)
[facility] [address] [place], [place], [address] Phone: [phone]   Fax:  [phone]  Physical Therapy Treatment  Patient Details  Name: Erika Frey MRN: [id number] Date of Birth: 11-21-1995 Referring Provider:  Vivi Barrack, DPM  Encounter Date: 01/23/2015      PT End of Session - 01/23/15 1809    Visit Number 4   Number of Visits 12   Date for PT Re-Evaluation 02/13/15   PT Start Time 1509   PT Stop Time 1550   PT Time Calculation (min) 41 min   Activity Tolerance Patient tolerated treatment well;No increased pain   Behavior During Therapy [facility] for tasks assessed/performed      Past Medical History  Diagnosis Date  . Asthma   . Absent menses     MOTHER STATES PT STARTED PERIOD AT AGE 51 BUT LAST PERIOD WAS 2 YRS AGO , AGE 58  . Exotropia of left eye   . Simple constipation     OCCASIONAL  . Sinus headache   . Immunizations up to date     Past Surgical History  Procedure Laterality Date  . Foot surgery  AGE 19  . Tonsillectomy and adenoidectomy  AGE 19  . Muscle recession and resection Left 12/16/2012    Procedure: LATERAL RECTUS RECESSION LEFT EYE;  Surgeon: Corinda Gubler, MD;  Location: [facility];  Service: Ophthalmology;  Laterality: Left;  . Median rectus repair Left 12/16/2012    Procedure: MEDIAN RECTUS RESECTION LEFT EYE;  Surgeon: Corinda Gubler, MD;  Location[facility];  Service: Ophthalmology;  Laterality: Left;    There were no vitals filed for this visit.  Visit Diagnosis:  Abnormality of gait  Achilles tendon pain      Subjective Assessment - 01/23/15 1516    Subjective has only one orthotic, lost one .  Today 8/10 pain, calfs feel tighter.  [place] walk [place] the [place] on [place]  had to keep sitting down.     Currently in Pain? Yes   Pain Score 4    Pain Location Calf   Pain Orientation Right;Left   Pain Descriptors / Indicators Cramping;Sore    Pain Radiating Towards proximal calf.     Pain Frequency Constant   Aggravating Factors  walking, 1st thing in am.   Pain Relieving Factors sitting to rest                         [facility] Adult PT Treatment/Exercise - 01/23/15 1515    Modalities   Modalities Ultrasound   Ultrasound   Ultrasound Location achilles, gastroc    Ultrasound Parameters 1.6 watts/cm2 pulsed (older machine)   Ultrasound Goals Pain   Manual Therapy   Manual Therapy Soft tissue mobilization   Manual therapy comments stretches, soft tissue work , Both  also taping to gastroc and anterior tib.     Ankle Exercises: Stretches   Theme park manager --  passive                  PT Short Term Goals - 01/23/15 1811    PT SHORT TERM GOAL #1   Title pt will be I with inital HEP (01/23/2015)   Time 3   Period Weeks   Status On-going   PT SHORT TERM GOAL #2   Title pt will decrease calf tightness to help promote increased dorsiflexion bil by atleast 4 degrees to assist with  gait efficiency (01/23/2015)   Time 3   Period Weeks   Status On-going           PT Long Term Goals - 01/02/15 1809    PT LONG TERM GOAL #1   Title At discharge pt will be I with all HEP given throughout therapy (02/13/15)   Baseline No previous HEP   Time 6   Period Weeks   Status New   PT LONG TERM GOAL #2   Title pt will be able to stand/walk for > 1 hour with < 3/10 pain to assist with work related requirments (02/13/15)   Baseline stand/ walk for 45-60 min with 9-10 /10 pain   Time 6   Period Weeks   Status New   PT LONG TERM GOAL #3   Title She will demonstrate increased ankle mobility to [facility] in all planes to assist with safety during gait and decreased chances of potentially tripping (02/13/15)   Baseline R dorsiflexon -2, L dorsiflexion 1   Time 6   Period Weeks   Status New   PT LONG TERM GOAL #4   Title Pt will be able to verbalize and demonstrate techniques to reduce bil achilles pain and  tightness via RICE, stretching and HEP   Baseline no previous method of controlling pain and tightness   Time 6   Period Weeks   Status New   PT LONG TERM GOAL #5   Title pt will her LEFS score by > 10 points to assist with improved functional capabiltys at discharge (02/13/15)   Baseline initall LEFS  57/80               Plan - 01/23/15 1810    Clinical Impression Statement ROM improving RT  DF   PT Next Visit Plan ask MD for orthotic order if OK with PT.  assess taping.  Closed chain exercise.     Consulted and Agree with Plan of Care Patient        Problem List Patient Active Problem List   Diagnosis Date Noted  . Exotropia of left eye 12/15/2012    [facility] 01/23/2015, 6:14 PM  [facility] [address] [place], [place], [address] Phone: [phone]   Fax:  [phone]     Liz Beach, PTA 01/23/2015 6:14 PM Phone: [phone] Fax: [phone]

## 2015-01-25 ENCOUNTER — Ambulatory Visit: Payer: Medicaid Other | Admitting: Physical Therapy

## 2015-01-25 DIAGNOSIS — M67879 Other specified disorders of synovium and tendon, unspecified ankle and foot: Secondary | ICD-10-CM | POA: Diagnosis not present

## 2015-01-25 DIAGNOSIS — M7660 Achilles tendinitis, unspecified leg: Secondary | ICD-10-CM

## 2015-01-25 DIAGNOSIS — R269 Unspecified abnormalities of gait and mobility: Secondary | ICD-10-CM

## 2015-01-25 NOTE — Patient Instructions (Signed)
Hamstring: Towel Stretch (Supine)    Lie on back. Loop towel around left foot, hip and knee at 90. Straighten knee and pull foot toward body. Hold _30__ seconds. Relax. Repeat _3__ times. Do 1___ times a day. Repeat with other leg.    Copyright  VHI. All rights reserved.   

## 2015-01-25 NOTE — Therapy (Signed)
[facility] [address] [place], [place], [address] Phone: [phone]   Fax:  [phone]  Physical Therapy Treatment  Patient Details  Name: Erika Frey MRN: [id number] Date of Birth: [birth year] Referring Provider:  Vivi Barrack, DPM  Encounter Date: 01/25/2015      PT End of Session - 01/25/15 1649    Visit Number 5   Number of Visits 12   PT Start Time 1505   PT Stop Time 1547   PT Time Calculation (min) 42 min   Activity Tolerance Patient tolerated treatment well;No increased pain   Behavior During Therapy [facility] for tasks assessed/performed      Past Medical History  Diagnosis Date  . Asthma   . Absent menses     MOTHER STATES PT STARTED PERIOD AT AGE 44 BUT LAST PERIOD WAS 2 YRS AGO , AGE 44  . Exotropia of left eye   . Simple constipation     OCCASIONAL  . Sinus headache   . Immunizations up to date     Past Surgical History  Procedure Laterality Date  . Foot surgery  AGE 18  . Tonsillectomy and adenoidectomy  AGE 10  . Muscle recession and resection Left 12/16/2012    Procedure: LATERAL RECTUS RECESSION LEFT EYE;  Surgeon: Corinda Gubler, MD;  Location: [facility];  Service: Ophthalmology;  Laterality: Left;  . Median rectus repair Left 12/16/2012    Procedure: MEDIAN RECTUS RESECTION LEFT EYE;  Surgeon: Corinda Gubler, MD;  Location: [facility];  Service: Ophthalmology;  Laterality: Left;    There were no vitals filed for this visit.  Visit Diagnosis:  Abnormality of gait  Achilles tendon pain      Subjective Assessment - 01/25/15 1504    Subjective No pain has been up a little.  Able to walk in [employer] 10 to 15 minutes yesterday and did not have any pain.     Currently in Pain? No/denies                         [facility] Adult PT Treatment/Exercise - 01/25/15 1507    Modalities   Modalities Ultrasound   Ultrasound   Ultrasound Location  achilles both with stretching   Ultrasound Parameters 1.6 watts/cm2 8 minutes each   Ultrasound Goals Pain   Manual Therapy   Manual therapy comments taping achilles both to assist discomfort.     Ankle Exercises: Aerobic   Stationary Bike NuStep, L5 X 5 minutes.   Ankle Exercises: Stretches   Other Stretch standing step stretch  10 second holds, 5 reps each   Other Stretch hamstring stretch 3 X 30 seconds. both   Ankle Exercises: Standing   Rocker Board --  10 X 10 seconds .  SBA foot slipped off 1 X                  PT Education - 01/25/15 1523    Education provided Yes   Education Details hamstring stretch   Person(s) Educated Patient   Methods Explanation;Demonstration;Verbal cues;Handout   Comprehension Verbalized understanding;Returned demonstration          PT Short Term Goals - 01/23/15 1811    PT SHORT TERM GOAL #1   Title pt will be I with inital HEP (01/23/2015)   Time 3   Period Weeks   Status On-going   PT SHORT TERM GOAL #2   Title pt will  decrease calf tightness to help promote increased dorsiflexion bil by atleast 4 degrees to assist with gait efficiency (01/23/2015)   Time 3   Period Weeks   Status On-going           PT Long Term Goals - 01/02/15 1809    PT LONG TERM GOAL #1   Title At discharge pt will be I with all HEP given throughout therapy (02/13/15)   Baseline No previous HEP   Time 6   Period Weeks   Status New   PT LONG TERM GOAL #2   Title pt will be able to stand/walk for > 1 hour with < 3/10 pain to assist with work related requirments (02/13/15)   Baseline stand/ walk for 45-60 min with 9-10 /10 pain   Time 6   Period Weeks   Status New   PT LONG TERM GOAL #3   Title She will demonstrate increased ankle mobility to [facility] in all planes to assist with safety during gait and decreased chances of potentially tripping (02/13/15)   Baseline R dorsiflexon -2, L dorsiflexion 1   Time 6   Period Weeks   Status New   PT LONG TERM  GOAL #4   Title Pt will be able to verbalize and demonstrate techniques to reduce bil achilles pain and tightness via RICE, stretching and HEP   Baseline no previous method of controlling pain and tightness   Time 6   Period Weeks   Status New   PT LONG TERM GOAL #5   Title pt will her LEFS score by > 10 points to assist with improved functional capabiltys at discharge (02/13/15)   Baseline initall LEFS  57/80               Plan - 01/25/15 1650    Clinical Impression Statement Able to perogress to some standing exercises.  Pain improved yesterday and today.LT hamstring limited 50%  RT limited 15 %.ROM   PT Next Visit Plan try step ups ? single leg stand   PT Home Exercise Plan hamstring stretch.     Consulted and Agree with Plan of Care Patient        Problem List Patient Active Problem List   Diagnosis Date Noted  . Exotropia of left eye 12/15/2012    [facility] 01/25/2015, 4:56 PM  [facility] [address] [place], [place], [address] Phone: [phone]   Fax:  [phone]     Liz Beach, PTA 01/25/2015 4:56 PM Phone: [phone] Fax: [phone]

## 2015-01-30 ENCOUNTER — Ambulatory Visit: Payer: Medicaid Other | Admitting: Physical Therapy

## 2015-01-30 DIAGNOSIS — M67879 Other specified disorders of synovium and tendon, unspecified ankle and foot: Secondary | ICD-10-CM | POA: Diagnosis not present

## 2015-01-30 DIAGNOSIS — R269 Unspecified abnormalities of gait and mobility: Secondary | ICD-10-CM

## 2015-01-30 DIAGNOSIS — M7660 Achilles tendinitis, unspecified leg: Secondary | ICD-10-CM

## 2015-01-30 NOTE — Patient Instructions (Signed)
   Kristoffer Leamon PT, DPT, LAT, ATC  [facility] Phone: [phone]     

## 2015-01-30 NOTE — Therapy (Signed)
[place], [place], [address] Phone: [phone]   Fax:  [phone]  Physical Therapy Treatment  Patient Details  Name: Erika Frey MRN: [id number] Date of Birth: 10-Aug-1995 Referring Provider:  Trula Slade, DPM  Encounter Date: 01/30/2015      PT End of Session - 01/30/15 1738    Visit Number 6   Number of Visits 12   Date for PT Re-Evaluation 02/13/15   Authorization Type Medicaid   PT Start Time 1500   PT Stop Time 1545   PT Time Calculation (min) 45 min   Activity Tolerance Patient tolerated treatment well;No increased pain   Behavior During Therapy [facility] for tasks assessed/performed      Past Medical History  Diagnosis Date  . Asthma   . Absent menses     MOTHER STATES PT STARTED PERIOD AT AGE 3 BUT LAST PERIOD WAS 2 YRS AGO , AGE 89  . Exotropia of left eye   . Simple constipation     OCCASIONAL  . Sinus headache   . Immunizations up to date     Past Surgical History  Procedure Laterality Date  . Foot surgery  AGE 48  . Tonsillectomy and adenoidectomy  AGE 55  . Muscle recession and resection Left 12/16/2012    Procedure: LATERAL RECTUS RECESSION LEFT EYE;  Surgeon: Dara Hoyer, MD;  Location: [facility];  Service: Ophthalmology;  Laterality: Left;  . Median rectus repair Left 12/16/2012    Procedure: MEDIAN RECTUS RESECTION LEFT EYE;  Surgeon: Dara Hoyer, MD;  Location: [facility];  Service: Ophthalmology;  Laterality: Left;    There were no vitals filed for this visit.  Visit Diagnosis:  Abnormality of gait  Achilles tendon pain      Subjective Assessment - 01/30/15 1505    Subjective "I have been doing pretty good, I am still a little tight but it does seem to get better"   Currently in Pain? Yes   Pain Score 4    Pain Location Calf   Pain Orientation Right;Left   Pain Descriptors / Indicators Cramping;Sore   Pain Type  Chronic pain   Pain Onset More than a month ago   Pain Frequency Constant   Aggravating Factors  walking, 1st thing in the Am   Pain Relieving Factors sitting to rest                         [facility] Adult PT Treatment/Exercise - 01/30/15 1507    Static Standing Balance   Single Leg Stance - Right Leg 10  4 x 10 sec best hold   Single Leg Stance - Left Leg 10  4 x 10 sec best hold   Modalities   Modalities Ultrasound   Ultrasound   Ultrasound Location achilles   Ultrasound Parameters 1.8 watts / cm2 6 min performed bil   total of 12 min   Ultrasound Goals Pain   Manual Therapy   Soft tissue mobilization bilateral gastroc soft tissue work using IASTYM tool -multiple points of spasm noted   Ankle Exercises: Aerobic   Stationary Bike L 2 x 6 min   Ankle Exercises: Standing   Heel Raises 15 reps  eccentric loading   Other Standing Ankle Exercises forward step ups on 6 inch step 2 x 15   Ankle Exercises: Stretches   Other Stretch standing step stretch  10 second holds, 5  reps each   Other Stretch hamstring stretch 3 X 30 seconds. both                PT Education - 01/30/15 1738    Education provided Yes   Education Details updated HEP   Person(s) Educated Patient   Methods Explanation   Comprehension Verbalized understanding          PT Short Term Goals - 01/30/15 1741    PT SHORT TERM GOAL #1   Title pt will be I with inital HEP (01/23/2015)   Baseline doing at home.   Time 3   Period Weeks   Status Achieved   PT SHORT TERM GOAL #2   Title pt will decrease calf tightness to help promote increased dorsiflexion bil by atleast 4 degrees to assist with gait efficiency (01/23/2015)   Baseline R dorsiflexion -2 degress, L dorsiflexion 1 degree   Time 3   Period Weeks   Status On-going           PT Long Term Goals - 01/30/15 1741    PT LONG TERM GOAL #1   Title At discharge pt will be I with all HEP given throughout therapy (02/13/15)    Baseline No previous HEP   Time 6   Period Weeks   Status On-going   PT LONG TERM GOAL #2   Title pt will be able to stand/walk for > 1 hour with < 3/10 pain to assist with work related requirments (02/13/15)   Baseline stand/ walk for 45-60 min with 9-10 /10 pain   Time 6   Period Weeks   Status On-going   PT LONG TERM GOAL #3   Title She will demonstrate increased ankle mobility to [facility] in all planes to assist with safety during gait and decreased chances of potentially tripping (02/13/15)   Baseline R dorsiflexon -2, L dorsiflexion 1   Time 6   Period Weeks   Status On-going   PT LONG TERM GOAL #4   Title Pt will be able to verbalize and demonstrate techniques to reduce bil achilles pain and tightness via RICE, stretching and HEP   Baseline no previous method of controlling pain and tightness   Time 6   Period Weeks   Status On-going   PT LONG TERM GOAL #5   Title pt will her LEFS score by > 10 points to assist with improved functional capabiltys at discharge (02/13/15)   Baseline initall LEFS  57/80   Time 6   Period Weeks   Status On-going               Plan - 01/30/15 1739    Clinical Impression Statement Erika presents to therapy with report of mild pain of 4/10. She met STG #1 this visit. She reported improved pian following [place] and manual over the calves and was able to complete step up exercises and single leg balance without increased pian.    PT Next Visit Plan step ups, single leg balance, eccentric calf exercises, manual, [place] with stretch   PT Home Exercise Plan eccentric heel raise        Problem List Patient Active Problem List   Diagnosis Date Noted  . Exotropia of left eye 12/15/2012   Starr Lake PT, DPT, LAT, ATC  01/30/2015  5:48 PM      [facility] [address] [place], [place], [address] Phone: [phone]   Fax:  [phone]

## 2015-02-01 ENCOUNTER — Ambulatory Visit: Payer: Medicaid Other | Admitting: Physical Therapy

## 2015-02-06 ENCOUNTER — Ambulatory Visit: Payer: Medicaid Other | Attending: Podiatry | Admitting: Physical Therapy

## 2015-02-06 DIAGNOSIS — M7660 Achilles tendinitis, unspecified leg: Secondary | ICD-10-CM

## 2015-02-06 DIAGNOSIS — M67879 Other specified disorders of synovium and tendon, unspecified ankle and foot: Secondary | ICD-10-CM | POA: Insufficient documentation

## 2015-02-06 DIAGNOSIS — R269 Unspecified abnormalities of gait and mobility: Secondary | ICD-10-CM | POA: Diagnosis present

## 2015-02-06 NOTE — Patient Instructions (Signed)
Use heat before walking, cold after

## 2015-02-06 NOTE — Therapy (Signed)
[facility] [address] [place], [place], [address] Phone: [phone]   Fax:  [phone]  Physical Therapy Treatment  Patient Details  Name: Erika Frey MRN: [id number] Date of Birth: Mar 14, 1996 Referring Provider:  Vivi Barrack, DPM  Encounter Date: 02/06/2015      PT End of Session - 02/06/15 1741    Visit Number 7   Number of Visits 12   Date for PT Re-Evaluation 02/13/15   PT Start Time 1512   PT Stop Time 1555   PT Time Calculation (min) 43 min   Activity Tolerance Patient tolerated treatment well;No increased pain   Behavior During Therapy [facility] for tasks assessed/performed      Past Medical History  Diagnosis Date  . Asthma   . Absent menses     MOTHER STATES PT STARTED PERIOD AT AGE 41 BUT LAST PERIOD WAS 2 YRS AGO , AGE 56  . Exotropia of left eye   . Simple constipation     OCCASIONAL  . Sinus headache   . Immunizations up to date     Past Surgical History  Procedure Laterality Date  . Foot surgery  AGE 54  . Tonsillectomy and adenoidectomy  AGE 62  . Muscle recession and resection Left 12/16/2012    Procedure: LATERAL RECTUS RECESSION LEFT EYE;  Surgeon: Corinda Gubler, MD;  Location: [facility];  Service: Ophthalmology;  Laterality: Left;  . Median rectus repair Left 12/16/2012    Procedure: MEDIAN RECTUS RESECTION LEFT EYE;  Surgeon: Corinda Gubler, MD;  Location: [facility];  Service: Ophthalmology;  Laterality: Left;    There were no vitals filed for this visit.  Visit Diagnosis:  Abnormality of gait  Achilles tendon pain      Subjective Assessment - 02/06/15 1513    Subjective Able to do walking 15 minutes in her parking lot for exercise, New.  6/10 soreness.  Calfs have not been sore lately.  She is having less am pain when she first gets up.   Currently in Pain? Yes   Pain Score 6    Pain Location Calf   Pain Orientation Right;Left   Pain  Descriptors / Indicators Sore   Pain Radiating Towards no radiation   Aggravating Factors  walking,.     Pain Relieving Factors Stretches PT   Multiple Pain Sites No                         [facility] Adult PT Treatment/Exercise - 02/06/15 1518    Self-Care   Self-Care --  discussed orthotics with patient and Mother. Shoes,    Ultrasound   Ultrasound Location achilles, both   Ultrasound Parameters 1.8 watts/cm2   Ultrasound Goals Pain   Manual Therapy   Manual Therapy Scapular mobilization   Soft tissue mobilization bilateral distal gastroc, achilles with manual stretches. after [place] treatments.                  PT Education - 02/06/15 1739    Education provided Yes   Education Details shoes ,got to [employer], or other good shoe store for shoe assessment  make sure they know she is getting orthotisc.    Person(s) Educated Patient;Parent(s)   Methods Explanation   Comprehension Verbalized understanding          PT Short Term Goals - 02/06/15 1743    PT SHORT TERM GOAL #1   Title pt  will be I with inital HEP (01/23/2015)   Baseline doing at home.   Time 3   Period Weeks   Status Achieved   PT SHORT TERM GOAL #2   Title pt will decrease calf tightness to help promote increased dorsiflexion bil by atleast 4 degrees to assist with gait efficiency (01/23/2015)   Time 3   Status On-going           PT Long Term Goals - 02/06/15 1744    PT LONG TERM GOAL #1   Title At discharge pt will be I with all HEP given throughout therapy (02/13/15)   Time 6   Period Weeks   Status On-going   PT LONG TERM GOAL #2   Title pt will be able to stand/walk for > 1 hour with < 3/10 pain to assist with work related requirments (02/13/15)   Baseline 15 minutes more comfortable   Time 6   Period Weeks   Status On-going   PT LONG TERM GOAL #3   Title She will demonstrate increased ankle mobility to [facility] in all planes to assist with safety during gait and decreased  chances of potentially tripping (02/13/15)   Time 6   Period Weeks   Status On-going   PT LONG TERM GOAL #4   Title Pt will be able to verbalize and demonstrate techniques to reduce bil achilles pain and tightness via RICE, stretching and HEP   Time 6   Period Weeks   Status On-going   PT LONG TERM GOAL #5   Title pt will her LEFS score by > 10 points to assist with improved functional capabiltys at discharge (02/13/15)   Time 6   Period Weeks   Status On-going               Plan - 02/06/15 1742    Clinical Impression Statement Patient more functional.  She will get orthotics fitted when she is ready for discharge.   PT Next Visit Plan interested dry needle.   Consulted and Agree with Plan of Care Patient        Problem List Patient Active Problem List   Diagnosis Date Noted  . Exotropia of left eye 12/15/2012    [facility] 02/06/2015, 5:50 PM  [facility] [address] [place], [place], [address] Phone: [phone]   Fax:  [phone]     Liz Beach, PTA 02/06/2015 5:50 PM Phone: [phone] Fax: [phone]

## 2015-02-08 ENCOUNTER — Ambulatory Visit: Payer: Medicaid Other | Admitting: Physical Therapy

## 2015-02-08 DIAGNOSIS — R269 Unspecified abnormalities of gait and mobility: Secondary | ICD-10-CM | POA: Diagnosis not present

## 2015-02-08 DIAGNOSIS — M7660 Achilles tendinitis, unspecified leg: Secondary | ICD-10-CM

## 2015-02-08 NOTE — Therapy (Addendum)
[place], [place], [address] Phone: [phone]   Fax:  [phone]  Physical Therapy Treatment  Patient Details  Name: Erika Frey MRN: [id number] Date of Birth: May 03, 1996 Referring Provider:  Trula Slade, DPM  Encounter Date: 02/08/2015      PT End of Session - 02/08/15 1545    Visit Number 8   Number of Visits 12   Date for PT Re-Evaluation 02/13/15   Authorization Type Medicaid   PT Start Time 0300   PT Stop Time 0352   PT Time Calculation (min) 52 min   Activity Tolerance Patient tolerated treatment well   Behavior During Therapy [facility] for tasks assessed/performed      Past Medical History  Diagnosis Date  . Asthma   . Absent menses     MOTHER STATES PT STARTED PERIOD AT AGE 19 BUT LAST PERIOD WAS 2 YRS AGO , AGE 19AS 2 YRS AGO , AGE 19  . Exotropia of left eye   . Simple constipation     OCCASIONAL  . Sinus headache   . Immunizations up to date     Past Surgical History  Procedure Laterality Date  . Foot surgery  AGE 19  . Tonsillectomy and adenoidectomy  AGE 19  . Muscle recession and resection Left 12/16/2012    Procedure: LATERAL RECTUS RECESSION LEFT EYE;  Surgeon: Dara Hoyer, MD;  Location: [facility];  Service: Ophthalmology;  Laterality: Left;  . Median rectus repair Left 12/16/2012    Procedure: MEDIAN RECTUS RESECTION LEFT EYE;  Surgeon: Dara Hoyer, MD;  Location: [facility];  Service: Ophthalmology;  Laterality: Left;    There were no vitals filed for this visit.  Visit Diagnosis:  Abnormality of gait  Achilles tendon pain      Subjective Assessment - 02/08/15 1511    Subjective (p) " I am feeling a little tight today but feel like I am getting better"   Currently in Pain? (p) Yes   Pain Score (p) 3    Pain Location (p) Calf   Pain Orientation (p) Right            [facility] PT Assessment - 02/08/15 0001    AROM   Left Ankle  Dorsiflexion -10  to neutral                      [facility] Adult PT Treatment/Exercise - 02/08/15 0001    Modalities   Modalities Moist Heat   Moist Heat Therapy   Number Minutes Moist Heat 10 Minutes   Moist Heat Location Other (comment)  L calf in prone   Manual Therapy   Soft tissue mobilization instrument assisted STM of bilateral distal gastroc, achilles with manual stretches.   Ankle Exercises: Standing   Heel Raises 15 reps   Other Standing Ankle Exercises forward step ups on 6 inch step 2 x 15   Ankle Exercises: Stretches   Gastroc Stretch 3 reps;30 seconds  off edge of step   Other Stretch standing step stretch  10 second holds, 5 reps each          Trigger Point Dry Needling - 02/08/15 1543    Consent Given? Yes   Education Handout Provided Yes   Muscles Treated Lower Body Gastrocnemius;Soleus   Gastrocnemius Response Twitch response elicited;Palpable increased muscle length   Soleus Response Twitch response elicited;Palpable increased muscle length  PT Education - 02/08/15 1545    Education provided Yes   Education Details dry needling education    Person(s) Educated Patient   Methods Explanation   Comprehension Verbalized understanding          PT Short Term Goals - 02/06/15 1743    PT SHORT TERM GOAL #1   Title pt will be I with inital HEP (01/23/2015)   Baseline doing at home.   Time 3   Period Weeks   Status Achieved   PT SHORT TERM GOAL #2   Title pt will decrease calf tightness to help promote increased dorsiflexion bil by atleast 4 degrees to assist with gait efficiency (01/23/2015)   Time 3   Status On-going           PT Long Term Goals - 02/06/15 1744    PT LONG TERM GOAL #1   Title At discharge pt will be I with all HEP given throughout therapy (02/13/15)   Time 6   Period Weeks   Status On-going   PT LONG TERM GOAL #2   Title pt will be able to stand/walk for > 1 hour with < 3/10 pain to assist with  work related requirments (02/13/15)   Baseline 15 minutes more comfortable   Time 6   Period Weeks   Status On-going   PT LONG TERM GOAL #3   Title She will demonstrate increased ankle mobility to [facility] in all planes to assist with safety during gait and decreased chances of potentially tripping (02/13/15)   Time 6   Period Weeks   Status On-going   PT LONG TERM GOAL #4   Title Pt will be able to verbalize and demonstrate techniques to reduce bil achilles pain and tightness via RICE, stretching and HEP   Time 6   Period Weeks   Status On-going   PT LONG TERM GOAL #5   Title pt will her LEFS score by > 10 points to assist with improved functional capabiltys at discharge (02/13/15)   Time 6   Period Weeks   Status On-going               Plan - 02/08/15 1546    Clinical Impression Statement Erika presents to therapy today with report that she has some tightness and pain rated at a 3/10. She gave verbal consent for dry needling and exhibit a twitch response on the L gastroc/soleus which increased her DF by 10 degrees to neutral.  She reported no pain following exercises and instrument assisted STM.   PT Next Visit Plan assess response to dry needling, calf stretching/strengthening, intrinsic foot strengthening, dry needling if tolerated well,  modalities PRN   Consulted and Agree with Plan of Care Patient;Family member/caregiver        Problem List Patient Active Problem List   Diagnosis Date Noted  . Exotropia of left eye 12/15/2012   Starr Lake PT, DPT, LAT, ATC  02/08/2015  3:55 PM      [facility] [address] [place], [place], [address] Phone: [phone]   Fax:  [phone]     PHYSICAL THERAPY DISCHARGE SUMMARY  Visits from Start of Care: 8  Current functional level related to goals / functional outcomes: See goals   Remaining deficits: Calf tightness    Education / Equipment: HEP   Plan: Patient agrees to discharge.  Patient goals were not met. Patient is being discharged due to not returning since the last visit.  ?????  Kristoffer Leamon PT, DPT, LAT, ATC  03/14/2015  5:24 PM

## 2015-02-15 ENCOUNTER — Ambulatory Visit: Payer: Medicaid Other | Admitting: Physical Therapy

## 2015-03-11 ENCOUNTER — Encounter (HOSPITAL_COMMUNITY): Payer: Self-pay | Admitting: Emergency Medicine

## 2015-03-11 ENCOUNTER — Emergency Department (INDEPENDENT_AMBULATORY_CARE_PROVIDER_SITE_OTHER)
Admission: EM | Admit: 2015-03-11 | Discharge: 2015-03-11 | Disposition: A | Discharge status: Home / Self Care | Payer: Medicaid Other | Source: Home / Self Care

## 2015-03-11 DIAGNOSIS — J069 Acute upper respiratory infection, unspecified: Secondary | ICD-10-CM

## 2015-03-11 MED ORDER — SULFAMETHOXAZOLE-TRIMETHOPRIM 800-160 MG PO TABS: 1.0000 | ORAL_TABLET | Freq: Two times a day (BID) | ORAL | Status: AC

## 2015-03-11 NOTE — Discharge Instructions (Signed)
Upper Respiratory Infection, Adult Most upper respiratory infections (URIs) are a viral infection of the air passages leading to the lungs. A URI affects the nose, throat, and upper air passages. The most common type of URI is nasopharyngitis and is typically referred to as "the common cold." URIs run their course and usually go away on their own. Most of the time, a URI does not require medical attention, but sometimes a bacterial infection in the upper airways can follow a viral infection. This is called a secondary infection. Sinus and middle ear infections are common types of secondary upper respiratory infections. Bacterial pneumonia can also complicate a URI. A URI can worsen asthma and chronic obstructive pulmonary disease (COPD). Sometimes, these complications can require emergency medical care and may be life threatening.  CAUSES Almost all URIs are caused by viruses. A virus is a type of germ and can spread from one person to another.  RISKS FACTORS You may be at risk for a URI if:   You smoke.   You have chronic heart or lung disease.  You have a weakened defense (immune) system.   You are very young or very old.   You have nasal allergies or asthma.  You work in crowded or poorly ventilated areas.  You work in health care facilities or schools. SIGNS AND SYMPTOMS  Symptoms typically develop 2-3 days after you come in contact with a cold virus. Most viral URIs last 7-10 days. However, viral URIs from the influenza virus (flu virus) can last 14-18 days and are typically more severe. Symptoms may include:   Runny or stuffy (congested) nose.   Sneezing.   Cough.   Sore throat.   Headache.   Fatigue.   Fever.   Loss of appetite.   Pain in your forehead, behind your eyes, and over your cheekbones (sinus pain).  Muscle aches.  DIAGNOSIS  Your health care provider may diagnose a URI by:  Physical exam.  Tests to check that your symptoms are not due to  another condition such as:  Strep throat.  Sinusitis.  Pneumonia.  Asthma. TREATMENT  A URI goes away on its own with time. It cannot be cured with medicines, but medicines may be prescribed or recommended to relieve symptoms. Medicines may help:  Reduce your fever.  Reduce your cough.  Relieve nasal congestion. HOME CARE INSTRUCTIONS   Take medicines only as directed by your health care provider.   Gargle warm saltwater or take cough drops to comfort your throat as directed by your health care provider.  Use a warm mist humidifier or inhale steam from a shower to increase air moisture. This may make it easier to breathe.  Drink enough fluid to keep your urine clear or pale yellow.   Eat soups and other clear broths and maintain good nutrition.   Rest as needed.   Return to work when your temperature has returned to normal or as your health care provider advises. You may need to stay home longer to avoid infecting others. You can also use a face mask and careful hand washing to prevent spread of the virus.  Increase the usage of your inhaler if you have asthma.   Do not use any tobacco products, including cigarettes, chewing tobacco, or electronic cigarettes. If you need help quitting, ask your health care provider. PREVENTION  The best way to protect yourself from getting a cold is to practice good hygiene.   Avoid oral or hand contact with people with cold   symptoms.   Wash your hands often if contact occurs.  There is no clear evidence that vitamin C, vitamin E, echinacea, or exercise reduces the chance of developing a cold. However, it is always recommended to get plenty of rest, exercise, and practice good nutrition.  SEEK MEDICAL CARE IF:   You are getting worse rather than better.   Your symptoms are not controlled by medicine.   You have chills.  You have worsening shortness of breath.  You have brown or red mucus.  You have yellow or brown nasal  discharge.  You have pain in your face, especially when you bend forward.  You have a fever.  You have swollen neck glands.  You have pain while swallowing.  You have white areas in the back of your throat. SEEK IMMEDIATE MEDICAL CARE IF:   You have severe or persistent:  Headache.  Ear pain.  Sinus pain.  Chest pain.  You have chronic lung disease and any of the following:  Wheezing.  Prolonged cough.  Coughing up blood.  A change in your usual mucus.  You have a stiff neck.  You have changes in your:  Vision.  Hearing.  Thinking.  Mood. MAKE SURE YOU:   Understand these instructions.  Will watch your condition.  Will get help right away if you are not doing well or get worse.   This information is not intended to replace advice given to you by your health care provider. Make sure you discuss any questions you have with your health care provider.   Document Released: 10/16/2000 Document Revised: 09/06/2014 Document Reviewed: 07/28/2013 Elsevier Interactive Patient Education 2016 Elsevier Inc.  

## 2015-03-11 NOTE — ED Notes (Signed)
Cough and congestion, no fever.  Reports sark phlegm in morning and yellow phlegm throughout the day. C/o headache and swollen lymph node right neck.

## 2015-03-11 NOTE — ED Provider Notes (Signed)
CSN: [id number]     Arrival date & time 03/11/15  1435 History   None    Chief Complaint  Patient presents with  . URI   (Consider location/radiation/quality/duration/timing/severity/associated sxs/prior Treatment) HPI History obtained from patient:   LOCATION:upper resp SEVERITY: DURATION: over 2 weeks now CONTEXT: ongoing symptoms, treated with zpak, not any better.  QUALITY: MODIFYING FACTORS: OTC meds without relief of symptoms ASSOCIATED SYMPTOMS: swollen node right neck TIMING:constant  Past Medical History  Diagnosis Date  . Asthma   . Absent menses     MOTHER STATES PT STARTED PERIOD AT AGE 89 BUT LAST PERIOD WAS 2 YRS AGO , AGE 89  . Exotropia of left eye   . Simple constipation     OCCASIONAL  . Sinus headache   . Immunizations up to date    Past Surgical History  Procedure Laterality Date  . Foot surgery  AGE 41  . Tonsillectomy and adenoidectomy  AGE 51  . Muscle recession and resection Left 12/16/2012    Procedure: LATERAL RECTUS RECESSION LEFT EYE;  Surgeon: Corinda GublerMichael A Spencer, MD;  [facility];  Service: Ophthalmology;  Laterality: Left;  . Median rectus repair Left 12/16/2012    Procedure: MEDIAN RECTUS RESECTION LEFT EYE;  Surgeon: Corinda GublerMichael A Spencer, MD;  [facility];  Service: Ophthalmology;  Laterality: Left;   No family history on file. Social History  Substance Use Topics  . Smoking status: Never Smoker   . Smokeless tobacco: Never Used     Comment: NO SMOKER IN HOME  . Alcohol Use: No   OB History    No data available     Review of Systems  Allergies  Penicillins  Home Medications   Prior to Admission medications   Medication Sig Start Date End Date Taking? Authorizing Provider  albuterol (PROVENTIL HFA;VENTOLIN HFA) 108 (90 BASE) MCG/ACT inhaler Inhale 2 puffs into the lungs every 6 (six) hours as needed.    Historical Provider, MD  azithromycin (ZITHROMAX Z-PAK) 250 MG tablet Take 1  tablet (250 mg total) by mouth daily. For 4 more days 02/28/13   Ree ShayJamie Deis, MD  ibuprofen (ADVIL,MOTRIN) 600 MG tablet take 1 tablet by mouth twice a day if needed 10/24/14   Historical Provider, MD  montelukast (SINGULAIR) 10 MG tablet Take 10 mg by mouth at bedtime.    Historical Provider, MD  PATADAY 0.2 % SOLN PLACE 1 DROP INTO EACH EYE EVERY DAY 10/12/14   Historical Provider, MD  pseudoephedrine (SUDAFED) 60 MG tablet Take 1 tablet (60 mg total) by mouth every 8 (eight) hours as needed for congestion. 01/10/14   Roxy Horsemanobert Browning, PA-C  sulfamethoxazole-trimethoprim (BACTRIM DS,SEPTRA DS) 800-160 MG tablet Take 1 tablet by mouth 2 (two) times daily. 03/11/15 03/18/15  Tharon AquasFrank C Tracen Mahler, PA  triamcinolone cream (KENALOG) 0.1 % apply to affected area twice a day 10/12/14   Historical Provider, MD   Meds Ordered and Administered this Visit  Medications - No data to display  BP 111/67 mmHg  Pulse 91  Temp(Src) 98.9 F (37.2 C) (Oral)  Resp 18  SpO2 99%  LMP 02/04/2015 No data found.   Physical Exam  HENT:  Nose:     NURSES NOTES AND VITAL SIGNS REVIEWED. CONSTITUTIONAL: Well developed, well nourished, no acute distress HEENT: normocephalic, atraumatic EYES: Conjunctiva normal NECK:normal ROM, supple PULMONARY:No respiratory distress, normal effort, Lungs: CTAb/l CARDIOVASCULAR: RRR, no murmur ABDOMEN: soft, ND, NT, +'ve BS MUSCULOSKELETAL: Normal ROM of all extremities  SKIN: warm and dry without rash PSYCHIATRIC: Mood and affect normal  ED Course  Procedures (including critical care time)  Labs Review Labs Reviewed - No data to display  Imaging Review No results found.   Visual Acuity Review  Right Eye Distance:   Left Eye Distance:   Bilateral Distance:    Right Eye Near:   Left Eye Near:    Bilateral Near:         MDM   1. Acute URI   Long discussion with patient that she has a viral illness. Insists on antibx. The role of antibx have been explained, pt then  states she would still like the antibx. Side effects of medication is explained to patient.   Treatment options discussed including: symptomatic care No indications for investigations at this time.   Pt is stable for discharge at this time. Rx (amoxil )  Continue symptomatic treatment at home. Advised to follow up with PCP or return to the clinic if there are new or worsening of symptoms.  All questions answered.  Instructions of care provided.      Tharon Aquas, PA 03/11/15 1640

## 2015-03-17 ENCOUNTER — Ambulatory Visit: Payer: Medicaid Other | Admitting: Podiatry

## 2015-04-14 ENCOUNTER — Ambulatory Visit: Payer: Medicaid Other | Admitting: Podiatry

## 2015-04-24 ENCOUNTER — Ambulatory Visit: Payer: Medicaid Other | Admitting: Podiatry

## 2015-06-05 ENCOUNTER — Ambulatory Visit: Payer: Medicaid Other | Admitting: Podiatry

## 2015-06-20 ENCOUNTER — Ambulatory Visit (INDEPENDENT_AMBULATORY_CARE_PROVIDER_SITE_OTHER): Payer: Medicaid Other | Admitting: Podiatry

## 2015-06-20 DIAGNOSIS — M216X2 Other acquired deformities of left foot: Secondary | ICD-10-CM

## 2015-06-20 DIAGNOSIS — M216X1 Other acquired deformities of right foot: Secondary | ICD-10-CM

## 2015-06-20 DIAGNOSIS — Q669 Congenital deformity of feet, unspecified, unspecified foot: Secondary | ICD-10-CM

## 2015-06-22 NOTE — Progress Notes (Signed)
Patient ID: Erika Frey, female   DOB: 01/09/1996, 20 y.o.   MRN: [id number]  Subjective: 20 year old female presents the operative her mom for concerns for follow up evaluation of flatfoot deformity and equinus. She states that she did get the inserts from [employer] they seem to help some that she's knocking has not report. She does not wear them in all of her shoes. She states that she still gets pain along the arch of her foot when he does a lot of walking or standing. No recent injury or trauma. No swelling or redness. No tenderness to the pain does not wake her up at night. No other complaints.  Objective: AAO x3, NAD DP/PT pulses palpable bilaterally, CRT less than 3 seconds Protective sensation intact with Simms Weinstein monofilament, negative Tinel sign Nonweightbearing exam reveals equinus is present and there is tightness of the Achilles tendon upon dorsiflexion of the ankle. There is a scar overlying the area from prior surgery with slight tenderness overlying the area. Ankle, subtalar joint, midtarsal joint range of motion is intact without any pain. There is no specific area pinpoint bony tenderness or pain the vibratory sensation. Subjectively there is some tenderness on the medial band plantar fascia at times however she is not having pain at this time. There is no overlying edema, erythema, increase in warmth bilaterally. Weightbearing exam reveals a decrease in medial arch height with calcaneal valgus and forefoot abduction. Upon standing the left heel sits slightly off the ground. Gait evaluation reveals excessive pronation. MMT 5/5, ROM WNL.  No open lesions or pre-ulcerative lesions. Overall exam appears to be about unchanged although her symptoms are somewhat improved on the plantar fascia on the medial band and the arch of the foot but she does continue to have pain subjectively. No overlying edema, erythema, increase in warmth to bilateral lower extremities.  No pain with calf  compression, swelling, warmth, erythema bilaterally.   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 I did add a pad to help increase the arch of the orthotic as the orthotic does appear to be flexible. Also discussed shoe gear modifications and supportive shoes that she's been wearing very flat shoes. -Follow-up schedule. Call if questions or concerns.  Ovid Curd, DPM

## 2016-06-27 ENCOUNTER — Telehealth: Payer: Self-pay | Admitting: Podiatry

## 2016-06-27 NOTE — Telephone Encounter (Signed)
Tried returning call in regards to questions about obtaining medical records. Unable to leave message as voicemail box is full.

## 2017-10-09 ENCOUNTER — Encounter (HOSPITAL_COMMUNITY): Payer: Self-pay | Admitting: Nurse Practitioner

## 2017-10-09 ENCOUNTER — Emergency Department (HOSPITAL_COMMUNITY)
Admission: EM | Admit: 2017-10-09 | Discharge: 2017-10-09 | Disposition: A | Discharge status: Home / Self Care | Payer: No Typology Code available for payment source | Attending: Emergency Medicine | Admitting: Emergency Medicine

## 2017-10-09 ENCOUNTER — Other Ambulatory Visit: Payer: Self-pay

## 2017-10-09 DIAGNOSIS — M7918 Myalgia, other site: Secondary | ICD-10-CM | POA: Insufficient documentation

## 2017-10-09 DIAGNOSIS — Y999 Unspecified external cause status: Secondary | ICD-10-CM | POA: Diagnosis not present

## 2017-10-09 DIAGNOSIS — Z79899 Other long term (current) drug therapy: Secondary | ICD-10-CM | POA: Diagnosis not present

## 2017-10-09 DIAGNOSIS — J45909 Unspecified asthma, uncomplicated: Secondary | ICD-10-CM | POA: Insufficient documentation

## 2017-10-09 DIAGNOSIS — Y9389 Activity, other specified: Secondary | ICD-10-CM | POA: Insufficient documentation

## 2017-10-09 DIAGNOSIS — Y92410 Unspecified street and highway as the place of occurrence of the external cause: Secondary | ICD-10-CM | POA: Insufficient documentation

## 2017-10-09 DIAGNOSIS — M79651 Pain in right thigh: Secondary | ICD-10-CM | POA: Insufficient documentation

## 2017-10-09 MED ORDER — CYCLOBENZAPRINE HCL 10 MG PO TABS: 10.0000 mg | ORAL_TABLET | Freq: Three times a day (TID) | ORAL | 0 refills | Status: DC | PRN

## 2017-10-09 MED ORDER — NAPROXEN 500 MG PO TABS: 500.0000 mg | ORAL_TABLET | Freq: Two times a day (BID) | ORAL | 0 refills | Status: DC | PRN
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
## 2017-10-09 NOTE — Discharge Instructions (Signed)
Take naprosyn as directed for inflammation and pain with tylenol for breakthrough pain and flexeril for muscle relaxation. Do not drive or operate machinery with muscle relaxant use. Use heat to areas of soreness, no more than 20 minutes at a time every hour. Expect to be sore for the next few days and follow up with primary care physician for recheck of ongoing symptoms in the next 1-2 weeks. Return to ER for emergent changing or worsening of symptoms.  °  °

## 2020-06-27 ENCOUNTER — Ambulatory Visit: Payer: Self-pay | Admitting: Nurse Practitioner

## 2020-08-02 ENCOUNTER — Other Ambulatory Visit: Payer: Self-pay

## 2020-08-02 ENCOUNTER — Ambulatory Visit (INDEPENDENT_AMBULATORY_CARE_PROVIDER_SITE_OTHER): Payer: Medicare (Managed Care) | Admitting: Nurse Practitioner

## 2020-08-02 ENCOUNTER — Encounter: Payer: Self-pay | Admitting: Nurse Practitioner

## 2020-08-02 VITALS — BP 138/72 | HR 96 | Temp 98.4°F | Ht 65.8 in | Wt 348.8 lb

## 2020-08-02 DIAGNOSIS — Z6841 Body Mass Index (BMI) 40.0 and over, adult: Secondary | ICD-10-CM

## 2020-08-02 DIAGNOSIS — H04123 Dry eye syndrome of bilateral lacrimal glands: Secondary | ICD-10-CM | POA: Diagnosis not present

## 2020-08-02 DIAGNOSIS — Z7689 Persons encountering health services in other specified circumstances: Secondary | ICD-10-CM

## 2020-08-02 DIAGNOSIS — M79672 Pain in left foot: Secondary | ICD-10-CM

## 2020-08-02 DIAGNOSIS — M79671 Pain in right foot: Secondary | ICD-10-CM

## 2020-08-02 DIAGNOSIS — L309 Dermatitis, unspecified: Secondary | ICD-10-CM | POA: Diagnosis not present

## 2020-08-02 NOTE — Progress Notes (Signed)
I,[name],acting as a Neurosurgeon for [employer], NP.,have documented all relevant documentation on the behalf of [employer], NP,as directed by  Charlesetta Ivory, NP while in the presence of Charlesetta Ivory, NP.  This visit occurred during the SARS-CoV-2 public health emergency.  Safety protocols were in place, including screening questions prior to the visit, additional usage of staff PPE, and extensive cleaning of exam room while observing appropriate contact time as indicated for disinfecting solutions.  Subjective:     Patient ID: Erika Frey , female    DOB: Sep 24, 1995 , 25 y.o.   MRN: [id number]   Chief Complaint  Patient presents with  . Establish Care    HPI  Patient is here to establish care at our office. She recently transferred care from [facility]. She would like to discuss foot pain and eczema. She does not work. She does not go to school.  She is on disability. She will come in a couple of week for a physical exam.  She goes back in a month to see her ear and nose specialist for fluid drainage.   LMP: 07/23/2020. She is taking the Birth control pills.  She is sexually: Yes  Smoke: no  Drink: no  Diet: She eats egg white. She eats out for lunch everyday. She does not eat pork. She eats ground [place]. She eats a lot of lean chicken and cheese pizza.  Exercise: she walks 30 min. Outside.  She will try to go to the pool as well.     Past Medical History:  Diagnosis Date  . Absent menses    MOTHER STATES PT STARTED PERIOD AT AGE 34 BUT LAST PERIOD WAS 2 YRS AGO , AGE 54  . Asthma   . Exotropia of left eye   . Immunizations up to date   . Simple constipation    OCCASIONAL  . Sinus headache      Family History  Problem Relation Age of Onset  . Hypertension Mother   . Diabetes Mother   . Heart disease Mother   . Hypertension Maternal Grandmother   . Heart disease Maternal Grandmother      Current Outpatient Medications:  .  albuterol  (PROVENTIL HFA;VENTOLIN HFA) 108 (90 BASE) MCG/ACT inhaler, Inhale 2 puffs into the lungs every 6 (six) hours as needed., Disp: , Rfl:  .  Fluocinolone Acetonide 0.01 % OIL, Instill 5 drops twice daily into both ear(s) for 14 days. Then stop and use as needed for itching., Disp: , Rfl:  .  ibuprofen (ADVIL,MOTRIN) 600 MG tablet, take 1 tablet by mouth twice a day if needed, Disp: , Rfl: 0 .  montelukast (SINGULAIR) 10 MG tablet, Take 10 mg by mouth at bedtime., Disp: , Rfl:  .  PATADAY 0.2 % SOLN, PLACE 1 DROP INTO EACH EYE EVERY DAY, Disp: , Rfl: 0 .  pseudoephedrine (SUDAFED) 60 MG tablet, Take 1 tablet (60 mg total) by mouth every 8 (eight) hours as needed for congestion., Disp: 15 tablet, Rfl: 0 .  triamcinolone cream (KENALOG) 0.1 %, apply to affected area twice a day, Disp: , Rfl: 0 .  Norgestim-Eth Estrad Triphasic (TRI-LO-SPRINTEC PO), Tri-Lo-Sprintec 0.18 mg/0.215 mg/0.25 mg-25 mcg tablet  TAKE 1 TABLET BY MOUTH DAILY, Disp: , Rfl:    Allergies  Allergen Reactions  . Penicillins Other (See Comments)    Stomach cramps     Review of Systems  Constitutional: Negative.  Negative for chills, fatigue and fever.  HENT: Negative for sneezing.  Eyes:       Dry eyes   Respiratory: Negative.  Negative for shortness of breath.   Cardiovascular: Negative.  Negative for chest pain and palpitations.  Gastrointestinal: Negative.  Negative for constipation, diarrhea and vomiting.  Endocrine: Negative for polydipsia, polyphagia and polyuria.  Musculoskeletal: Positive for arthralgias.  Allergic/Immunologic: Positive for environmental allergies.  Neurological: Negative.   Psychiatric/Behavioral: Negative for dysphoric mood.  All other systems reviewed and are negative.    Today's Vitals   08/02/20 1424  BP: 138/72  Pulse: 96  Temp: 98.4 F (36.9 C)  TempSrc: Oral  Weight: (!) 348 lb 12.8 oz (158.2 kg)  Height: 5' 5.8" (1.671 m)  PainSc: 0-No pain   Body mass index is 56.64 kg/m.   Wt Readings from Last 3 Encounters:  08/02/20 (!) 348 lb 12.8 oz (158.2 kg)  10/09/17 (!) 315 lb (142.9 kg)  11/09/14 (!) 316 lb (143.3 kg) (>99 %, Z= 2.92)*   * Growth percentiles are based on CDC (Girls, 2-20 Years) data.    Objective:  Physical Exam Constitutional:      Appearance: Normal appearance. She is obese.  HENT:     Head: Normocephalic and atraumatic.  Cardiovascular:     Rate and Rhythm: Normal rate and regular rhythm.     Pulses: Normal pulses.     Heart sounds: Normal heart sounds. No murmur heard.   Pulmonary:     Effort: Pulmonary effort is normal. No respiratory distress.     Breath sounds: Normal breath sounds. No wheezing.  Musculoskeletal:     Right foot: Deformity present.     Left foot: Deformity present.  Feet:     Right foot:     Skin integrity: No warmth.     Left foot:     Skin integrity: No warmth.     Comments: Scar present from previous surgery  Skin:    General: Skin is warm and dry.     Capillary Refill: Capillary refill takes less than 2 seconds.     Findings: Erythema present.     Comments: Dry patches on her extremities   Neurological:     Mental Status: She is alert and oriented to person, place, and time.  Psychiatric:        Mood and Affect: Mood normal.        Behavior: Behavior normal.        Thought Content: Thought content normal.        Judgment: Judgment normal.       Assessment And Plan:     1. Encounter to establish care -Patient is here to establish care. Medical, surgical and family history was reviewed with the patient. Patient was advised to come back for a complete physical exam. Patient was advised on a healthy diet and exercise for atleast 30-45 min daily.   2. Eczema, unspecified type -patient suffering from eczema for many years. She has tried various creams and ointments and would like to be referred to a dermatologist.  -Advised patient to use fragrence free soaps, detergents, use vaseline and Aquaphor.   - Ambulatory referral to Dermatology  3. 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  -She has tried physical therapy in the past and she would like to go back to see a podiatrist.   - Ambulatory referral to Podiatry  4. Bilateral dry eyes -Advised patient to use OTC artifical drops such as Opcon-A or  Refresh Tears  -Advised patient to make an appt with her ophthalmologist for further evaluation.    5. Class 3 severe obesity due to excess calories without serious comorbidity with body mass index (BMI) of 50.0 to 59.9 in adult [facility])  -Encouraged patient to exercise for atleast 30-45 min daily. Encouraged patient to eat a healthy diet consisting of low sugar foods and drinks. Eat a diet low in carbs and fast food. Avoid eating out all the time. Consume green vegetables and fruits.   Patient was given opportunity to ask questions. Patient verbalized understanding of the plan and was able to repeat key elements of the plan. All questions were answered to their satisfaction.  Charlesetta Ivory NP   I, Charlesetta Ivory have reviewed all documentation for this visit. The documentation on 08/02/20 for the exam, diagnosis, procedures, and orders are all accurate and complete.   IF YOU HAVE BEEN REFERRED TO A SPECIALIST, IT MAY TAKE 1-2 WEEKS TO SCHEDULE/PROCESS THE REFERRAL. IF YOU HAVE NOT HEARD FROM US/SPECIALIST IN TWO WEEKS, PLEASE GIVE [place] A CALL AT [phone].   THE PATIENT IS ENCOURAGED TO PRACTICE SOCIAL DISTANCING DUE TO THE COVID-19 PANDEMIC.

## 2020-08-02 NOTE — Patient Instructions (Signed)
Flat Feet, Adult  Flat feet is a common condition in which there is no curve, or arch, on the inner sides of the feet. Normally, an adult foot has an arch. The arch creates a gap between the foot and the ground. This condition can occur in one foot or in both feet. What are the causes? This condition may be caused by:  An injury to tendons and ligaments in the foot, such as to the tendon that supports the arch (posterior tibial tendon). Tendons are tissues that connect muscle to bone, and ligaments are tissues that connect bones to each other.  Loose tendons or ligaments in your foot.  A wearing down of your arch over time.  Injury to bones in your foot.  An abnormality in the bones of your foot called tarsal coalition. This happens when two or more bones in the foot are joined together (fused). Tarsal coalition is often present at birth, but signs of it typically do not show until the early teen years. What increases the risk? The following factors may make you more likely to develop this condition:  Being an adult age 40 or older.  Having a family history of flat feet or a history of childhood flexible flatfoot.  Having obesity, diabetes, or high blood pressure.  Taking part in high-impact sports.  Having inflammatory arthritis.  Having had any of these problems with the bones in your foot: ? A broken bone (fracture). ? Bones that were moved out of place (dislocated). ? Achilles tendon injuries. What are the signs or symptoms? Symptoms of this condition include:  Pain or tightness along the bottom of your foot.  Foot pain that gets worse with activity.  Swelling of the inner side of your foot or swelling of your ankle.  Pain on the outer side of your ankle.  Changes in gait. Your gait is the way that you walk.  Pronation. This is when the foot and ankle lean inward when you are standing.  Bony bumps on the top or inner side of your foot. How is this diagnosed? This  condition is diagnosed with a physical exam of your foot and ankle. Your health care provider may also:  Check your shoes for patterns of wear on the soles.  Order imaging tests, such as X-rays to see the bone structure.  Refer you to a health care provider who specializes in feet (podiatrist). How is this treated? This condition may be treated with:  Rest and ice to decrease inflammation and pain in the affected foot.  Stretching or physical therapy exercises. These are done to: ? Improve movement and strength in your foot. ? Increase range of motion and relieve pain.  A shoe insert (orthotic insert) for one foot or both feet. This helps to support the arch of your foot. An orthotic insert or inserts can be purchased from a store or can be custom-made.  Wearing shoes with appropriate arch support. This is especially important for athletes.  Medicines. These may be prescribed or injected into the affected foot to relieve pain.  An ankle boot or cast or a foot or leg brace to relieve pressure on your affected foot.  Surgery. This may be done to improve the alignment of your foot. Surgery is needed only if your posterior tibial tendon is torn or if you have tarsal coalition.   Follow these instructions at home: Managing pain, stiffness, and swelling  If directed, put ice on the painful area. To do this: ?   If you have a removable brace or boot, remove it as told by your health care provider. ? Put ice in a plastic bag. ? Place a towel between your skin and the bag or between your cast and the bag. ? Leave the ice on for 20 minutes, 2-3 times a day. ? Remove the ice if your skin turns bright red. This is very important. If you cannot feel pain, heat, or cold, you have a greater risk of damage to the area.  Move your toes often to reduce stiffness and swelling.  Raise (elevate) the injured area above the level of your heart while you are sitting or lying down. Activity  Do  exercises as told by your health care provider, if they were prescribed.  If an activity causes pain, avoid it or try to find another activity that does not cause pain. General instructions  Wear an orthotic insert and appropriate shoes as told by your health care provider.  Take over-the-counter and prescription medicines only as told by your health care provider.  Wear a brace, boot, or cast as told by your health care provider.  Keep all follow-up visits. This is important. How is this prevented? To prevent the condition from getting worse:  Wear comfortable, supportive shoes that are appropriate for your activities.  Maintain a healthy weight.  Stay active in a way that your health care provider recommends. This will help to keep your feet flexible and strong.  Manage long-term (chronic) health conditions, such as diabetes, high blood pressure, and inflammatory arthritis.  Work with a health care provider if you have concerns about your feet or shoes. Contact a health care provider if you have:  Pain in your foot or lower leg that gets worse or does not improve with medicine.  Pain or trouble when walking.  Problems with your orthotic insert. Summary  Flat feet is a common condition in which there is no curve, or arch, on the inner sides of the feet. This condition can occur in one foot or in both feet.  Your health care provider may recommend a shoe insert (orthotic insert) for one foot or both feet, or shoes with the appropriate arch support.  Other treatments may include stretching or physical therapy exercises, pain medicines, and wearing a foot or leg brace or an ankle boot or cast.  Surgery may be done if you have a tear in the tendon that supports your arch (posterior tibial tendon) or if two or more of your foot bones were joined together, or fused,before birth (tarsal coalition). This information is not intended to replace advice given to you by your health care  provider. Make sure you discuss any questions you have with your health care provider. Document Revised: 09/30/2019 Document Reviewed: 09/30/2019 Elsevier Patient Education  2021 [employer]  

## 2020-09-12 ENCOUNTER — Encounter: Payer: Self-pay | Admitting: Nurse Practitioner

## 2020-09-12 ENCOUNTER — Other Ambulatory Visit: Payer: Self-pay

## 2020-09-12 ENCOUNTER — Ambulatory Visit (INDEPENDENT_AMBULATORY_CARE_PROVIDER_SITE_OTHER): Payer: Medicare (Managed Care) | Admitting: Nurse Practitioner

## 2020-09-12 ENCOUNTER — Other Ambulatory Visit (HOSPITAL_COMMUNITY)
Admission: RE | Admit: 2020-09-12 | Discharge: 2020-09-12 | Disposition: A | Discharge status: Home / Self Care | Payer: Medicare (Managed Care) | Source: Ambulatory Visit | Attending: Nurse Practitioner | Admitting: Nurse Practitioner

## 2020-09-12 VITALS — BP 114/80 | HR 84 | Temp 98.4°F | Ht 66.2 in | Wt 352.6 lb

## 2020-09-12 DIAGNOSIS — Z3041 Encounter for surveillance of contraceptive pills: Secondary | ICD-10-CM | POA: Diagnosis not present

## 2020-09-12 DIAGNOSIS — Z3049 Encounter for surveillance of other contraceptives: Secondary | ICD-10-CM

## 2020-09-12 DIAGNOSIS — R3129 Other microscopic hematuria: Secondary | ICD-10-CM

## 2020-09-12 DIAGNOSIS — L309 Dermatitis, unspecified: Secondary | ICD-10-CM

## 2020-09-12 DIAGNOSIS — M545 Low back pain, unspecified: Secondary | ICD-10-CM

## 2020-09-12 DIAGNOSIS — Z01419 Encounter for gynecological examination (general) (routine) without abnormal findings: Secondary | ICD-10-CM

## 2020-09-12 LAB — POCT URINALYSIS DIPSTICK
Bilirubin, UA: NEGATIVE
Glucose, UA: NEGATIVE
Ketones, UA: NEGATIVE
Nitrite, UA: NEGATIVE
Protein, UA: POSITIVE — AB
Spec Grav, UA: 1.02 (ref 1.010–1.025)
Urobilinogen, UA: 0.2 E.U./dL
pH, UA: 5.5 (ref 5.0–8.0)

## 2020-09-12 LAB — POCT URINE PREGNANCY: Preg Test, Ur: NEGATIVE

## 2020-09-12 MED ORDER — NORGESTIM-ETH ESTRAD TRIPHASIC 0.18/0.215/0.25 MG-25 MCG PO TABS: 1.0000 | ORAL_TABLET | Freq: Every day | ORAL | 3 refills | Status: DC

## 2020-09-12 MED ORDER — NITROFURANTOIN MONOHYD MACRO 100 MG PO CAPS: 100.0000 mg | ORAL_CAPSULE | Freq: Two times a day (BID) | ORAL | 0 refills | Status: AC

## 2020-09-12 MED ORDER — TRIAMCINOLONE ACETONIDE 0.1 % EX CREA: TOPICAL_CREAM | CUTANEOUS | 2 refills | Status: DC

## 2020-09-12 NOTE — Progress Notes (Signed)
I,[name] as a Neurosurgeon for [employer], FNP.,have documented all relevant documentation on the behalf of [name], FNP,as directed by  [name], FNP while in the presence of [name], FNP. This visit occurred during the SARS-CoV-2 public health emergency.  Safety protocols were in place, including screening questions prior to the visit, additional usage of staff PPE, and extensive cleaning of exam room while observing appropriate contact time as indicated for disinfecting solutions.  Subjective:     Patient ID: Erika Frey , female    DOB: 1995-07-04 , 25 y.o.   MRN: [id number]   Chief Complaint  Patient presents with  . Back Pain    Patient stated she was having some uti symptoms about 2 weeks ago but they went away she is just having some low back pain.   . Rash    HPI  Patient presents today for low back pain.   Back Pain This is a new problem. The current episode started 1 to 4 weeks ago (2 weeks ago). The problem occurs constantly. The problem has been gradually improving since onset. The quality of the pain is described as aching. The pain is at a severity of 5/10. The pain is moderate. The pain is the same all the time. Associated symptoms include dysuria (2 weeks ago). Pertinent negatives include no abdominal pain, bladder incontinence, fever, headaches or tingling. Risk factors include obesity and sedentary lifestyle. She has tried nothing for the symptoms.     Past Medical History:  Diagnosis Date  . Absent menses    MOTHER STATES PT STARTED PERIOD AT AGE 19 BUT LAST PERIOD WAS 2 YRS AGO , AGE 81  . Asthma   . Exotropia of left eye   . Immunizations up to date   . Simple constipation    OCCASIONAL  . Sinus headache      Family History  Problem Relation Age of Onset  . Hypertension Mother   . Diabetes Mother   . Heart disease Mother   . Hypertension Maternal Grandmother   . Heart disease Maternal Grandmother      Current  Outpatient Medications:  .  albuterol (PROVENTIL HFA;VENTOLIN HFA) 108 (90 BASE) MCG/ACT inhaler, Inhale 2 puffs into the lungs every 6 (six) hours as needed., Disp: , Rfl:  .  Fluocinolone Acetonide 0.01 % OIL, Instill 5 drops twice daily into both ear(s) for 14 days. Then stop and use as needed for itching., Disp: , Rfl:  .  ibuprofen (ADVIL,MOTRIN) 600 MG tablet, take 1 tablet by mouth twice a day if needed, Disp: , Rfl: 0 .  montelukast (SINGULAIR) 10 MG tablet, Take 10 mg by mouth at bedtime., Disp: , Rfl:  .  Norgestimate-Ethinyl Estradiol Triphasic (TRI-LO-SPRINTEC) 0.18/0.215/0.25 MG-25 MCG tab, Take 1 tablet by mouth daily., Disp: 74 tablet, Rfl: 3 .  PATADAY 0.2 % SOLN, PLACE 1 DROP INTO EACH EYE EVERY DAY, Disp: , Rfl: 0 .  pseudoephedrine (SUDAFED) 60 MG tablet, Take 1 tablet (60 mg total) by mouth every 8 (eight) hours as needed for congestion., Disp: 15 tablet, Rfl: 0 .  triamcinolone cream (KENALOG) 0.1 %, apply to affected area twice a day, Disp: 30 g, Rfl: 2   Allergies  Allergen Reactions  . Penicillins Other (See Comments)    Stomach cramps     Review of Systems  Constitutional: Negative.  Negative for fatigue and fever.  Respiratory: Negative.   Cardiovascular: Negative.   Gastrointestinal: Negative for abdominal pain.  Genitourinary: Positive  for dysuria (2 weeks ago). Negative for bladder incontinence.  Musculoskeletal: Positive for back pain.  Neurological: Negative for tingling and headaches.  Psychiatric/Behavioral: Negative.      Today's Vitals   09/12/20 1456  BP: 114/80  Pulse: 84  Temp: 98.4 F (36.9 C)  TempSrc: Oral  Weight: (!) 352 lb 9.6 oz (159.9 kg)  Height: 5' 6.2" (1.681 m)  PainSc: 0-No pain   Body mass index is 56.57 kg/m.   Objective:  Physical Exam Vitals reviewed.  Constitutional:      General: She is not in acute distress.    Appearance: Normal appearance.  Cardiovascular:     Rate and Rhythm: Normal rate and regular rhythm.      Pulses: Normal pulses.     Heart sounds: Normal heart sounds. No murmur heard.   Pulmonary:     Effort: Pulmonary effort is normal. No respiratory distress.     Breath sounds: Normal breath sounds. No wheezing.  Neurological:     General: No focal deficit present.     Mental Status: She is alert and oriented to person, place, and time.     Cranial Nerves: No cranial nerve deficit.  Psychiatric:        Mood and Affect: Mood normal.        Behavior: Behavior normal.        Thought Content: Thought content normal.        Judgment: Judgment normal.         Assessment And Plan:     1. Low back pain, unspecified back pain laterality, unspecified chronicity, unspecified whether sciatica present  Right low back tenderness, negative CVA tenderness - POCT Urinalysis Dipstick (81002) - Culture, Urine  2. Eczema, unspecified type  Will refill her steroid cream - triamcinolone cream (KENALOG) 0.1 %; apply to affected area twice a day  Dispense: 30 g; Refill: 2  3. Surveillance for birth control, oral contraceptives  Negative pregnancy test  Will restart her OCPs, she also would like a referral to GYN to establish care - POCT Urine Pregnancy - Norgestimate-Ethinyl Estradiol Triphasic (TRI-LO-SPRINTEC) 0.18/0.215/0.25 MG-25 MCG tab; Take 1 tablet by mouth daily.  Dispense: 74 tablet; Refill: 3  4. Other microscopic hematuria  She has trace blood in her urine  Will treat for urinary tract infection as she has large leukocytes as well.  Will also send urine for STD check  Educated on increasing water intake and avoiding sodas.   - Culture, Urine - Urine cytology ancillary only   Reports she had her HPV at age 49 will check [facility] for immunizations  Patient was given opportunity to ask questions. Patient verbalized understanding of the plan and was able to repeat key elements of the plan. All questions were answered to their satisfaction.  [name], FNP   I, [name], FNP, have reviewed all documentation for this visit. The documentation on 09/12/20 for the exam, diagnosis, procedures, and orders are all accurate and complete.   IF YOU HAVE BEEN REFERRED TO A SPECIALIST, IT MAY TAKE 1-2 WEEKS TO SCHEDULE/PROCESS THE REFERRAL. IF YOU HAVE NOT HEARD FROM US/SPECIALIST IN TWO WEEKS, PLEASE GIVE [place] A CALL AT [phone].   THE PATIENT IS ENCOURAGED TO PRACTICE SOCIAL DISTANCING DUE TO THE COVID-19 PANDEMIC.

## 2020-09-12 NOTE — Patient Instructions (Signed)
Urinary Tract Infection, Adult A urinary tract infection (UTI) is an infection of any part of the urinary tract. The urinary tract includes:  The kidneys.  The ureters.  The bladder.  The urethra. These organs make, store, and get rid of pee (urine) in the body. What are the causes? This infection is caused by germs (bacteria) in your genital area. These germs grow and cause swelling (inflammation) of your urinary tract. What increases the risk? The following factors may make you more likely to develop this condition:  Using a small, thin tube (catheter) to drain pee.  Not being able to control when you pee or poop (incontinence).  Being female. If you are female, these things can increase the risk: ? Using these methods to prevent pregnancy:  A medicine that kills sperm (spermicide).  A device that blocks sperm (diaphragm). ? Having low levels of a female hormone (estrogen). ? Being pregnant. You are more likely to develop this condition if:  You have genes that add to your risk.  You are sexually active.  You take antibiotic medicines.  You have trouble peeing because of: ? A prostate that is bigger than normal, if you are female. ? A blockage in the part of your body that drains pee from the bladder. ? A kidney stone. ? A nerve condition that affects your bladder. ? Not getting enough to drink. ? Not peeing often enough.  You have other conditions, such as: ? Diabetes. ? A weak disease-fighting system (immune system). ? Sickle cell disease. ? Gout. ? Injury of the spine. What are the signs or symptoms? Symptoms of this condition include:  Needing to pee right away.  Peeing small amounts often.  Pain or burning when peeing.  Blood in the pee.  Pee that smells bad or not like normal.  Trouble peeing.  Pee that is cloudy.  Fluid coming from the vagina, if you are female.  Pain in the belly or lower back. Other symptoms include:  Vomiting.  Not  feeling hungry.  Feeling mixed up (confused). This may be the first symptom in older adults.  Being tired and grouchy (irritable).  A fever.  Watery poop (diarrhea). How is this treated?  Taking antibiotic medicine.  Taking other medicines.  Drinking enough water. In some cases, you may need to see a specialist. Follow these instructions at home: Medicines  Take over-the-counter and prescription medicines only as told by your doctor.  If you were prescribed an antibiotic medicine, take it as told by your doctor. Do not stop taking it even if you start to feel better. General instructions  Make sure you: ? Pee until your bladder is empty. ? Do not hold pee for a long time. ? Empty your bladder after sex. ? Wipe from front to back after peeing or pooping if you are a female. Use each tissue one time when you wipe.  Drink enough fluid to keep your pee pale yellow.  Keep all follow-up visits.   Contact a doctor if:  You do not get better after 1-2 days.  Your symptoms go away and then come back. Get help right away if:  You have very bad back pain.  You have very bad pain in your lower belly.  You have a fever.  You have chills.  You feeling like you will vomit or you vomit. Summary  A urinary tract infection (UTI) is an infection of any part of the urinary tract.  This condition is caused by   germs in your genital area.  There are many risk factors for a UTI.  Treatment includes antibiotic medicines.  Drink enough fluid to keep your pee pale yellow. This information is not intended to replace advice given to you by your health care provider. Make sure you discuss any questions you have with your health care provider. Document Revised: 12/03/2019 Document Reviewed: 12/03/2019 Elsevier Patient Education  2021 Elsevier Inc.  

## 2020-09-14 LAB — URINE CYTOLOGY ANCILLARY ONLY
Candida Urine: POSITIVE — AB
Chlamydia: NEGATIVE
Comment: NEGATIVE
Comment: NEGATIVE
Comment: NORMAL
Neisseria Gonorrhea: NEGATIVE
Trichomonas: NEGATIVE

## 2020-09-17 LAB — URINE CULTURE

## 2020-09-18 ENCOUNTER — Other Ambulatory Visit: Payer: Self-pay

## 2020-09-18 DIAGNOSIS — B379 Candidiasis, unspecified: Secondary | ICD-10-CM

## 2020-09-18 DIAGNOSIS — L309 Dermatitis, unspecified: Secondary | ICD-10-CM

## 2020-09-18 MED ORDER — TRIAMCINOLONE ACETONIDE 0.1 % EX CREA: TOPICAL_CREAM | CUTANEOUS | 2 refills | Status: DC

## 2020-09-18 MED ORDER — FLUCONAZOLE 150 MG PO TABS: ORAL_TABLET | ORAL | 0 refills | Status: DC

## 2020-09-18 NOTE — Progress Notes (Signed)
Med sent [facility]

## 2020-09-20 ENCOUNTER — Other Ambulatory Visit: Payer: Self-pay

## 2020-09-20 DIAGNOSIS — B379 Candidiasis, unspecified: Secondary | ICD-10-CM

## 2020-09-20 MED ORDER — FLUCONAZOLE 150 MG PO TABS
ORAL_TABLET | ORAL | 0 refills | Status: DC
Start: 2020-09-20 — End: 2020-12-18

## 2020-11-02 ENCOUNTER — Other Ambulatory Visit: Payer: Self-pay

## 2020-11-02 ENCOUNTER — Encounter: Payer: Self-pay | Admitting: Nurse Practitioner

## 2020-11-02 ENCOUNTER — Ambulatory Visit (INDEPENDENT_AMBULATORY_CARE_PROVIDER_SITE_OTHER): Payer: Medicare (Managed Care) | Admitting: Nurse Practitioner

## 2020-11-02 VITALS — BP 112/78 | HR 90 | Temp 98.8°F | Ht 67.0 in | Wt 349.2 lb

## 2020-11-02 DIAGNOSIS — Z0000 Encounter for general adult medical examination without abnormal findings: Secondary | ICD-10-CM

## 2020-11-02 DIAGNOSIS — R5383 Other fatigue: Secondary | ICD-10-CM | POA: Diagnosis not present

## 2020-11-02 DIAGNOSIS — Z23 Encounter for immunization: Secondary | ICD-10-CM | POA: Diagnosis not present

## 2020-11-02 DIAGNOSIS — Z114 Encounter for screening for human immunodeficiency virus [HIV]: Secondary | ICD-10-CM

## 2020-11-02 DIAGNOSIS — Z6841 Body Mass Index (BMI) 40.0 and over, adult: Secondary | ICD-10-CM

## 2020-11-02 DIAGNOSIS — E559 Vitamin D deficiency, unspecified: Secondary | ICD-10-CM

## 2020-11-02 DIAGNOSIS — Z13228 Encounter for screening for other metabolic disorders: Secondary | ICD-10-CM

## 2020-11-02 DIAGNOSIS — Z1159 Encounter for screening for other viral diseases: Secondary | ICD-10-CM

## 2020-11-02 NOTE — Progress Notes (Addendum)
I,Erika Frey,acting as a Education administrator for [employer], NP.,have documented all relevant documentation on the behalf of [employer], NP,as directed by  Erika Castilla, NP while in the presence of Erika Castilla, NP.  This visit occurred during the SARS-CoV-2 public health emergency.  Safety protocols were in place, including screening questions prior to the visit, additional usage of staff PPE, and extensive cleaning of exam room while observing appropriate contact time as indicated for disinfecting solutions.  Subjective:     Patient ID: Erika Frey , female    DOB: 11-17-1995 , 25 y.o.   MRN: [id number]   Chief Complaint  Patient presents with   Annual Exam    HPI  The patient is here today for a physical, the patient said she is scheduled for a pap with her GYN in July. Diet: No pork. She eats chicken, [place]. She doesn't eat beef. But she does pasta and sugar.  Exercise: She just signed up for the gym.  Sexually active: yes. On BC pill. Last MP: 06/18  Drink or smoke: No  She works from home. She has her own website.     Past Medical History:  Diagnosis Date   Absent menses    MOTHER STATES PT STARTED PERIOD AT AGE 12 BUT LAST PERIOD WAS 2 YRS AGO , AGE 83   Asthma    Exotropia of left eye    Immunizations up to date    Simple constipation    OCCASIONAL   Sinus headache      Family History  Problem Relation Age of Onset   Hypertension Mother    Diabetes Mother    Heart disease Mother    Hypertension Maternal Grandmother    Heart disease Maternal Grandmother      Current Outpatient Medications:    albuterol (PROVENTIL HFA;VENTOLIN HFA) 108 (90 BASE) MCG/ACT inhaler, Inhale 2 puffs into the lungs every 6 (six) hours as needed., Disp: , Rfl:    Fluocinolone Acetonide 0.01 % OIL, Instill 5 drops twice daily into both ear(s) for 14 days. Then stop and use as needed for itching., Disp: , Rfl:    ibuprofen (ADVIL,MOTRIN) 600 MG tablet, take 1 tablet by  mouth twice a day if needed, Disp: , Rfl: 0   montelukast (SINGULAIR) 10 MG tablet, Take 10 mg by mouth at bedtime., Disp: , Rfl:    Norgestimate-Ethinyl Estradiol Triphasic (TRI-LO-SPRINTEC) 0.18/0.215/0.25 MG-25 MCG tab, Take 1 tablet by mouth daily., Disp: 74 tablet, Rfl: 3   pseudoephedrine (SUDAFED) 60 MG tablet, Take 1 tablet (60 mg total) by mouth every 8 (eight) hours as needed for congestion., Disp: 15 tablet, Rfl: 0   triamcinolone cream (KENALOG) 0.1 %, apply to affected area twice a day, Disp: 80 g, Rfl: 2   fluconazole (DIFLUCAN) 150 MG tablet, Take one tablet by mouth at the onset of your symptoms then repeat in 5 days (Patient not taking: Reported on 11/02/2020), Disp: 2 tablet, Rfl: 0   PATADAY 0.2 % SOLN, PLACE 1 DROP INTO EACH EYE EVERY DAY (Patient not taking: Reported on 11/02/2020), Disp: , Rfl: 0   Vitamin D, Ergocalciferol, (DRISDOL) 1.25 MG (50000 UNIT) CAPS capsule, Take 1 capsule (50,000 Units total) by mouth every 7 (seven) days., Disp: 12 capsule, Rfl: 0   Allergies  Allergen Reactions   Penicillins Other (See Comments)    Stomach cramps      The patient states she uses OCP (estrogen/progesterone) for birth control. Last LMP was Patient's last menstrual period was 10/21/2020.Marland Kitchen  Negative for Dysmenorrhea. Negative for: breast discharge, breast lump(s), breast pain and breast self exam. Associated symptoms include abnormal vaginal bleeding. Pertinent negatives include abnormal bleeding (hematology), anxiety, decreased libido, depression, difficulty falling sleep, dyspareunia, history of infertility, nocturia, sexual dysfunction, sleep disturbances, urinary incontinence, urinary urgency, vaginal discharge and vaginal itching. Diet regular.The patient states her exercise level is    . The patient's tobacco use is:  Social History   Tobacco Use  Smoking Status Never  Smokeless Tobacco Never  Tobacco Comments   NO SMOKER IN HOME  . She has been exposed to passive smoke.  The patient's alcohol use is:  Social History   Substance and Sexual Activity  Alcohol Use No   Alcohol/week: 0.0 standard drinks  . Additional information: Last pap , next one scheduled for 2022   Review of Systems  Constitutional:  Positive for fatigue. Negative for chills and fever.  HENT:  Negative for congestion and rhinorrhea.   Respiratory: Negative.  Negative for cough, shortness of breath and wheezing.   Cardiovascular: Negative.   Gastrointestinal: Negative.   Endocrine: Negative for polydipsia, polyphagia and polyuria.  Musculoskeletal:  Negative for arthralgias and myalgias.  Neurological: Negative.  Negative for dizziness, weakness and headaches.  Psychiatric/Behavioral: Negative.      Today's Vitals   11/02/20 1149  BP: 112/78  Pulse: 90  Temp: 98.8 F (37.1 C)  TempSrc: Oral  Weight: (!) 349 lb 3.2 oz (158.4 kg)  Height: $Remove'5\' 7"'[id number]$  (1.702 m)   Body mass index is 54.69 kg/m.   Objective:  Physical Exam Vitals and nursing note reviewed.  Constitutional:      Appearance: Normal appearance.  HENT:     Head: Normocephalic and atraumatic.     Right Ear: Tympanic membrane, ear canal and external ear normal.     Left Ear: Tympanic membrane, ear canal and external ear normal.     Nose: Nose normal.     Mouth/Throat:     Mouth: Mucous membranes are moist.     Pharynx: Oropharynx is clear.  Eyes:     Extraocular Movements: Extraocular movements intact.     Conjunctiva/sclera: Conjunctivae normal.     Pupils: Pupils are equal, round, and reactive to light.  Cardiovascular:     Rate and Rhythm: Normal rate and regular rhythm.     Pulses: Normal pulses.     Heart sounds: Normal heart sounds. No murmur heard. Pulmonary:     Effort: Pulmonary effort is normal. No respiratory distress.     Breath sounds: Normal breath sounds. No wheezing.  Chest:  Breasts:    Tanner Score is 5.     Right: Normal. No swelling.     Left: Normal. No swelling.  Abdominal:      General: Bowel sounds are normal.     Palpations: Abdomen is soft.  Genitourinary:    Comments: deferred Musculoskeletal:        General: Normal range of motion.     Cervical back: Normal range of motion and neck supple.  Skin:    General: Skin is warm and dry.  Neurological:     General: No focal deficit present.     Mental Status: She is alert and oriented to person, place, and time.  Psychiatric:        Mood and Affect: Mood normal.        Behavior: Behavior normal.        Assessment And Plan:   1. Encounter for screening for metabolic disorder  Will assess and evaluate.   - CMP14+EGFR - Lipid panel  2. Immunization due - Tdap vaccine greater than or equal to 7yo IM  3. Other fatigue - TSH + free T4 - CBC no Diff  4. Vitamin D deficiency Will check and supplement if needed. Advised patient to spend atleast 15 min. Daily in sunlight.  - Vitamin D (25 hydroxy)  5. Encounter for screening for HIV - HIV antibody (with reflex)  6. Encounter for hepatitis C screening test for low risk patient - Hepatitis C antibody  The patient was encouraged to call or send a message through [place] for any questions or concerns.   Follow up: 1 year for annual physical   Side effects and appropriate use of all the medication(s) were discussed with the patient today. Patient advised to use the medication(s) as directed by their healthcare provider. The patient was encouraged to read, review, and understand all associated package inserts and contact our office with any questions or concerns. The patient accepts the risks of the treatment plan and had an opportunity to ask questions.   Staying healthy and adopting a healthy lifestyle for your overall health is important. You should eat 7 or more servings of fruits and vegetables per day. You should drink plenty of water to keep yourself hydrated and your kidneys healthy. This includes about 65-80+ fluid ounces of water. Limit your intake of  animal fats especially for elevated cholesterol. Avoid highly processed food and limit your salt intake if you have hypertension. Avoid foods high in saturated/Trans fats. Along with a healthy diet it is also very important to maintain time for yourself to maintain a healthy mental health with low stress levels. You should get atleast 150 min of moderate intensity exercise weekly for a healthy heart. Along with eating right and exercising, aim for at least 7-9 hours of sleep daily.  Eat more whole grains which includes barley, wheat berries, oats, brown rice and whole wheat pasta. Use healthy plant oils which include olive, soy, corn, sunflower and peanut. Limit your caffeine and sugary drinks. Limit your intake of fast foods. Limit milk and dairy products to one or two daily servings.    Patient was given opportunity to ask questions. Patient verbalized understanding of the plan and was able to repeat key elements of the plan. All questions were answered to their satisfaction.  Raman Cenia Zaragosa, DNP   I, Raman Jazarah Capili have reviewed all documentation for this visit. The documentation on 11/02/20 for the exam, diagnosis, procedures, and orders are all accurate and complete.    THE PATIENT IS ENCOURAGED TO PRACTICE SOCIAL DISTANCING DUE TO THE COVID-19 PANDEMIC.

## 2020-11-03 LAB — CBC
Hematocrit: 37.1 % (ref 34.0–46.6)
Hemoglobin: 11.9 g/dL (ref 11.1–15.9)
MCH: 23.8 pg — ABNORMAL LOW (ref 26.6–33.0)
MCHC: 32.1 g/dL (ref 31.5–35.7)
MCV: 74 fL — ABNORMAL LOW (ref 79–97)
Platelets: 384 10*3/uL (ref 150–450)
RBC: 5.01 x10E6/uL (ref 3.77–5.28)
RDW: 15.8 % — ABNORMAL HIGH (ref 11.7–15.4)
WBC: 11.6 10*3/uL — ABNORMAL HIGH (ref 3.4–10.8)

## 2020-11-03 LAB — CMP14+EGFR
ALT: 19 IU/L (ref 0–32)
AST: 21 IU/L (ref 0–40)
Albumin/Globulin Ratio: 1.2 (ref 1.2–2.2)
Albumin: 3.8 g/dL — ABNORMAL LOW (ref 3.9–5.0)
Alkaline Phosphatase: 54 IU/L (ref 44–121)
BUN/Creatinine Ratio: 14 (ref 9–23)
BUN: 10 mg/dL (ref 6–20)
Bilirubin Total: 0.3 mg/dL (ref 0.0–1.2)
CO2: 21 mmol/L (ref 20–29)
Calcium: 9.4 mg/dL (ref 8.7–10.2)
Chloride: 103 mmol/L (ref 96–106)
Creatinine, Ser: 0.74 mg/dL (ref 0.57–1.00)
Globulin, Total: 3.2 g/dL (ref 1.5–4.5)
Glucose: 97 mg/dL (ref 65–99)
Potassium: 4.8 mmol/L (ref 3.5–5.2)
Sodium: 137 mmol/L (ref 134–144)
Total Protein: 7 g/dL (ref 6.0–8.5)
eGFR: 115 mL/min/{1.73_m2} (ref >59)

## 2020-11-03 LAB — HIV ANTIBODY (ROUTINE TESTING W REFLEX): HIV Screen 4th Generation wRfx: NONREACTIVE

## 2020-11-03 LAB — LIPID PANEL
Chol/HDL Ratio: 3.9 ratio (ref 0.0–4.4)
Cholesterol, Total: 175 mg/dL (ref 100–199)
HDL: 45 mg/dL (ref >39)
LDL Chol Calc (NIH): 117 mg/dL — ABNORMAL HIGH (ref 0–99)
Triglycerides: 69 mg/dL (ref 0–149)
VLDL Cholesterol Cal: 13 mg/dL (ref 5–40)

## 2020-11-03 LAB — VITAMIN D 25 HYDROXY (VIT D DEFICIENCY, FRACTURES): Vit D, 25-Hydroxy: 24.7 ng/mL — ABNORMAL LOW (ref 30.0–100.0)

## 2020-11-03 LAB — TSH+FREE T4
Free T4: 1.01 ng/dL (ref 0.82–1.77)
TSH: 1.31 u[IU]/mL (ref 0.450–4.500)

## 2020-11-03 LAB — HEPATITIS C ANTIBODY: Hep C Virus Ab: 0.1 s/co ratio (ref 0.0–0.9)

## 2020-11-07 ENCOUNTER — Other Ambulatory Visit: Payer: Self-pay | Admitting: Nurse Practitioner

## 2020-11-07 DIAGNOSIS — E559 Vitamin D deficiency, unspecified: Secondary | ICD-10-CM

## 2020-11-07 MED ORDER — VITAMIN D (ERGOCALCIFEROL) 1.25 MG (50000 UNIT) PO CAPS: 50000.0000 [IU] | ORAL_CAPSULE | ORAL | 0 refills | Status: DC

## 2020-11-08 NOTE — Addendum Note (Signed)
Addended by: Delma Officer on: 11/08/2020 04:53 PM   Modules accepted: Level of Service

## 2020-11-23 ENCOUNTER — Encounter: Payer: Medicare (Managed Care) | Admitting: Obstetrics and Gynecology

## 2020-12-18 ENCOUNTER — Ambulatory Visit (INDEPENDENT_AMBULATORY_CARE_PROVIDER_SITE_OTHER): Payer: Medicare Other | Admitting: Nurse Practitioner

## 2020-12-18 ENCOUNTER — Other Ambulatory Visit: Payer: Self-pay

## 2020-12-18 DIAGNOSIS — R059 Cough, unspecified: Secondary | ICD-10-CM

## 2020-12-18 DIAGNOSIS — U071 COVID-19: Secondary | ICD-10-CM

## 2020-12-18 DIAGNOSIS — E559 Vitamin D deficiency, unspecified: Secondary | ICD-10-CM

## 2020-12-18 LAB — POC COVID19 BINAXNOW: SARS Coronavirus 2 Ag: POSITIVE — AB

## 2020-12-18 MED ORDER — BENZONATATE 100 MG PO CAPS: 100.0000 mg | ORAL_CAPSULE | Freq: Four times a day (QID) | ORAL | 1 refills | Status: DC | PRN

## 2020-12-18 MED ORDER — VITAMIN D (ERGOCALCIFEROL) 1.25 MG (50000 UNIT) PO CAPS: 50000.0000 [IU] | ORAL_CAPSULE | ORAL | 0 refills | Status: DC

## 2020-12-18 MED ORDER — NIRMATRELVIR/RITONAVIR (PAXLOVID)TABLET
3.0000 | ORAL_TABLET | Freq: Two times a day (BID) | ORAL | 0 refills | Status: AC
Start: 2020-12-18 — End: 2020-12-23

## 2020-12-18 NOTE — Progress Notes (Signed)
I,Erika Frey as a Neurosurgeon for [employer], FNP.,have documented all relevant documentation on the behalf of Erika Felts, FNP,as directed by  Erika Felts, FNP while in the presence of Erika Felts, FNP.  This visit occurred during the SARS-CoV-2 public health emergency.  Safety protocols were in place, including screening questions prior to the visit, additional usage of staff PPE, and extensive cleaning of exam room while observing appropriate contact time as indicated for disinfecting solutions.  Subjective:     Patient ID: Erika Frey , female    DOB: February 04, 1996 , 25 y.o.   MRN: [id number]   Chief Complaint  Patient presents with   URI    HPI  Patient is seen outside of office  URI  This is a new problem. The current episode started in the past 7 days. There has been no fever. Associated symptoms include congestion, coughing, ear pain (left ear), headaches, sneezing and swollen glands. She has tried decongestant (tried amoxcillin that she had left over) for the symptoms. The treatment provided mild relief.    Past Medical History:  Diagnosis Date   Absent menses    MOTHER STATES PT STARTED PERIOD AT AGE 22 BUT LAST PERIOD WAS 2 YRS AGO , AGE 81   Asthma    Exotropia of left eye    Immunizations up to date    Simple constipation    OCCASIONAL   Sinus headache      Family History  Problem Relation Age of Onset   Hypertension Mother    Diabetes Mother    Heart disease Mother    Hypertension Maternal Grandmother    Heart disease Maternal Grandmother      Current Outpatient Medications:    albuterol (PROVENTIL HFA;VENTOLIN HFA) 108 (90 BASE) MCG/ACT inhaler, Inhale 2 puffs into the lungs every 6 (six) hours as needed., Disp: , Rfl:    benzonatate (TESSALON PERLES) 100 MG capsule, Take 1 capsule (100 mg total) by mouth every 6 (six) hours as needed. (Patient not taking: No sig reported), Disp: 30 capsule, Rfl: 1   Fluocinolone Acetonide 0.01 % OIL,  Instill 5 drops twice daily into both ear(s) for 14 days. Then stop and use as needed for itching., Disp: , Rfl:    ibuprofen (ADVIL,MOTRIN) 600 MG tablet, take 1 tablet by mouth twice a day if needed, Disp: , Rfl: 0   montelukast (SINGULAIR) 10 MG tablet, Take 10 mg by mouth at bedtime., Disp: , Rfl:    Norgestimate-Ethinyl Estradiol Triphasic (TRI-LO-SPRINTEC) 0.18/0.215/0.25 MG-25 MCG tab, Take 1 tablet by mouth daily., Disp: 74 tablet, Rfl: 3   PATADAY 0.2 % SOLN, , Disp: , Rfl: 0   pseudoephedrine (SUDAFED) 60 MG tablet, Take 1 tablet (60 mg total) by mouth every 8 (eight) hours as needed for congestion., Disp: 15 tablet, Rfl: 0   triamcinolone cream (KENALOG) 0.1 %, apply to affected area twice a day, Disp: 80 g, Rfl: 2   cetirizine (ZYRTEC) 10 MG tablet, Take 1 tablet by mouth daily., Disp: , Rfl:    fluticasone (FLONASE) 50 MCG/ACT nasal spray, SHAKE LIQUID AND USE 2 SPRAYS IN EACH NOSTRIL DAILY, Disp: , Rfl:    ibuprofen (ADVIL) 800 MG tablet, Take 800 mg by mouth every 8 (eight) hours as needed., Disp: , Rfl:    levocetirizine (XYZAL) 5 MG tablet, Take 5 mg by mouth daily., Disp: , Rfl:    mometasone (ELOCON) 0.1 % ointment, SMARTSIG:Sparingly Topical Twice Daily PRN, Disp: , Rfl:    montelukast (  SINGULAIR) 10 MG tablet, Take 1 tablet by mouth daily., Disp: , Rfl:    Olopatadine HCl 0.6 % SOLN, Place 2 sprays into both nostrils 2 (two) times daily., Disp: , Rfl:    tacrolimus (PROTOPIC) 0.1 % ointment, Apply topically 2 (two) times daily. Large body surface area, Disp: 60 g, Rfl: 0   Vitamin D, Ergocalciferol, (DRISDOL) 1.25 MG (50000 UNIT) CAPS capsule, Take 1 capsule (50,000 Units total) by mouth every 7 (seven) days., Disp: 12 capsule, Rfl: 0   Allergies  Allergen Reactions   Penicillins Other (See Comments)    Stomach cramps     Review of Systems  HENT:  Positive for congestion, ear pain (left ear) and sneezing.   Respiratory:  Positive for cough.   Neurological:  Positive for  headaches.    There were no vitals filed for this visit. There is no height or weight on file to calculate BMI.   Objective:  Physical Exam Vitals reviewed.  Constitutional:      General: She is not in acute distress.    Appearance: Normal appearance.  Cardiovascular:     Rate and Rhythm: Normal rate and regular rhythm.     Pulses: Normal pulses.     Heart sounds: Normal heart sounds. No murmur heard. Pulmonary:     Effort: Pulmonary effort is normal. No respiratory distress.     Breath sounds: Normal breath sounds. No wheezing.  Neurological:     General: No focal deficit present.     Mental Status: She is alert and oriented to person, place, and time.     Cranial Nerves: No cranial nerve deficit.     Motor: No weakness.  Psychiatric:        Mood and Affect: Mood normal.        Behavior: Behavior normal.        Thought Content: Thought content normal.        Judgment: Judgment normal.        Assessment And Plan:     1. COVID-19 Rapid covid is positive, will treat with Paxlovid Advised patient to take Vitamin C, D, Zinc.  Keep yourself hydrated with a lot of water and rest. Take Delsym for cough and Mucinex as need. Take Tylenol or pain reliever every 4-6 hours as needed for pain/fever/body ache. If you have elevated blood pressure, you can take OTC Corcidin. You can also take OTC oscillococcinum to help with your symptoms.  Educated patient if symptoms get worse or if she experiences any SOB, chest pain or pain in her legs to seek immediate emergency care. Continue to monitor your oxygen levels. Call us if you have any questions. Quarantine for 5 days if tested positive and no symptoms or 10 days if tested positive and have symptoms. Wear a mask around other people.   - POC COVID-19 - nirmatrelvir/ritonavir EUA (PAXLOVID) TABS; Take 3 tablets by mouth 2 (two) times daily for 5 days. Patient GFR is 115. Take nirmatrelvir (150 mg) two tablets twice daily for 5 days and ritonavir  (100 mg) one tablet twice daily for 5 days.  Dispense: 30 tablet; Refill: 0 - benzonatate (TESSALON PERLES) 100 MG capsule; Take 1 capsule (100 mg total) by mouth every 6 (six) hours as needed. (Patient not taking: No sig reported)  Dispense: 30 capsule; Refill: 1  2. Cough - benzonatate (TESSALON PERLES) 100 MG capsule; Take 1 capsule (100 mg total) by mouth every 6 (six) hours as needed. (Patient not taking: No sig  reported)  Dispense: 30 capsule; Refill: 1    Patient was given opportunity to ask questions. Patient verbalized understanding of the plan and was able to repeat key elements of the plan. All questions were answered to their satisfaction.  Erika Felts, FNP    I, Erika Felts, FNP, have reviewed all documentation for this visit. The documentation on 12/18/20 for the exam, diagnosis, procedures, and orders are all accurate and complete.  IF YOU HAVE BEEN REFERRED TO A SPECIALIST, IT MAY TAKE 1-2 WEEKS TO SCHEDULE/PROCESS THE REFERRAL. IF YOU HAVE NOT HEARD FROM US/SPECIALIST IN TWO WEEKS, PLEASE GIVE [place] A CALL AT [phone] X [phone].   THE PATIENT IS ENCOURAGED TO PRACTICE SOCIAL DISTANCING DUE TO THE COVID-19 PANDEMIC.

## 2020-12-18 NOTE — Patient Instructions (Signed)
COVID-19: Quarantine and Isolation Quarantine If you were exposed Quarantine and stay away from others when you have been in close contact with someone whohas COVID-19. Isolate If you are sick or test positive Isolate when you are sick or when you have COVID-19, even if you don't have symptoms. When to stay home Calculating quarantine The date of your exposure is considered day 0. Day 1 is the first full day after your last contact with a person who has had COVID-19. Stay home and away from other people for at least 5 days. Learn why CDC updated guidance for the general public. IF YOU were exposed to COVID-19 and are NOT up-to-date IF YOU were exposed to COVID-19 and are NOT on COVID-19 vaccinations Quarantine for at least 5 days Stay home Stay home and quarantine for at least 5 full days. Wear a well-fitted mask if you must be around others in your home. Do not travel. Get tested Even if you don't develop symptoms, get tested at least 5 days after you last had close contact with someone with COVID-19. After quarantine Watch for symptoms Watch for symptoms until 10 days after you last had close contact with someone with COVID-19. Avoid travel It is best to avoid travel until a full 10 days after you last had close contact with someone with COVID-19. If you develop symptoms Isolate immediately and get tested. Continue to stay home until you know the results. Wear a well-fitted mask around others. Take precautions until day 10 Wear a mask Wear a well-fitted mask for 10 full days any time you are around others inside your home or in public. Do not go to places where you are unable to wear a mask. If you must travel during days 6-10, take precautions. Avoid being around people who are at high risk IF YOU were exposed to COVID-19 and are up-to-date IF YOU were exposed to COVID-19 and are on COVID-19 vaccinations No quarantine You do not need to stay home unless you develop  symptoms. Get tested Even if you don't develop symptoms, get tested at least 5 days after you last had close contact with someone with COVID-19. Watch for symptoms Watch for symptoms until 10 days after you last had close contact with someone with COVID-19. If you develop symptoms Isolate immediately and get tested. Continue to stay home until you know the results. Wear a well-fitted mask around others. Take precautions until day 10 Wear a mask Wear a well-fitted mask for 10 full days any time you are around others inside your home or in public. Do not go to places where you are unable to wear a mask. Take precautions if traveling Avoid being around people who are at high risk IF YOU were exposed to COVID-19 and had confirmed COVID-19 within the past 90 days (you tested positive using a viral test) No quarantine You do not need to stay home unless you develop symptoms. Watch for symptoms Watch for symptoms until 10 days after you last had close contact with someone with COVID-19. If you develop symptoms Isolate immediately and get tested. Continue to stay home until you know the results. Wear a well-fitted mask around others. Take precautions until day 10 Wear a mask Wear a well-fitted mask for 10 full days any time you are around others inside your home or in public. Do not go to places where you are unable to wear a mask. Take precautions if traveling Avoid being around people who are at high risk Calculating isolation   Day 0 is your first day of symptoms or a positive viral test. Day 1 is the first full day after your symptoms developed or your test specimen was collected. If you have COVID-19 or have symptoms, isolate for at least 5 days. IF YOU tested positive for COVID-19 or have symptoms, regardless of vaccination status Stay home for at least 5 days Stay home for 5 days and isolate from others in your home. Wear a well-fitted mask if you must be around others in your home. Do not  travel. Ending isolation if you had symptoms End isolation after 5 full days if you are fever-free for 24 hours (without the use of fever-reducing medication) and your symptoms are improving. Ending isolation if you did NOT have symptoms End isolation after at least 5 full days after your positive test. If you were severely ill with COVID-19 or are immunocompromised You should isolate for at least 10 days. Consult your doctor before ending isolation. Take precautions until day 10 Wear a mask Wear a well-fitted mask for 10 full days any time you are around others inside your home or in public. Do not go to places where you are unable to wear a mask. Do not travel Do not travel until a full 10 days after your symptoms started or the date your positive test was taken if you had no symptoms. Avoid being around people who are at high risk Definitions Exposure Contact with someone infected with SARS-CoV-2, the virus that causes COVID-19,in a way that increases the likelihood of getting infected with the virus. Close contact A close contact is someone who was less than 6 feet away from an infected person (laboratory-confirmed or a clinical diagnosis) for a cumulative total of 15 minutes or more over a 24-hour period. For example, three individual 5-minute exposures for a total of 15 minutes. People who are exposed to someone with COVID-19 after they completed at least 5 days of isolation are notconsidered close contacts. Quarantine Quarantine is a strategy used to prevent transmission of COVID-19 by keeping people who have been in close contact with someone with COVID-19 apart from others. Who does not need to quarantine? If you had close contact with someone with COVID-19 and you are in one of the following groups, you do not need to quarantine. You are up to date with your COVID-19 vaccines. You had confirmed COVID-19 within the last 90 days (meaning you tested positive using a viral test). You  should wear a well-fitting mask around others for 10 days from the date of your last close contact with someone with COVID-19 (the date of last close contact is considered day 0). Get tested at least 5 days after you last had close contact with someone with COVID-19. If you test positive or develop COVID-19 symptoms, isolate from other people and follow recommendations in the Isolation section below. If you tested positive for COVID-19 with a viral test within the previous 90 days and subsequently recovered and remain without COVID-19 symptoms, you do not need to quarantine or get tested after close contact. You should wear a well-fitting mask around others for 10 days from the date of your last close contact withsomeone with COVID-19 (the date of last close contact is considered day 0). Who should quarantine? If you come into close contact with someone with COVID-19, you should quarantine if you are not up to date on COVID-19 vaccines. This includes people who are not vaccinated. What to do for quarantine Stay home and away   from other people for at least 5 days (day 0 through day 5) after your last contact with a person who has COVID-19. The date of your exposure is considered day 0. Wear a well-fitting mask when around others at home, if possible. For 10 days after your last close contact with someone with COVID-19, watch for fever (100.4F or greater), cough, shortness of breath, or other COVID-19 symptoms. If you develop symptoms, get tested immediately and isolate until you receive your test results. If you test positive, follow isolation recommendations. If you do not develop symptoms, get tested at least 5 days after you last had close contact with someone with COVID-19. If you test negative, you can leave your home, but continue to wear a well-fitting mask when around others at home and in public until 10 days after your last close contact with someone with COVID-19. If you test positive, you should  isolate for at least 5 days from the date of your positive test (if you do not have symptoms). If you do develop COVID-19 symptoms, isolate for at least 5 days from the date your symptoms began (the date the symptoms started is day 0). Follow recommendations in the isolation section below. If you are unable to get a test 5 days after last close contact with someone with COVID-19, you can leave your home after day 5 if you have been without COVID-19 symptoms throughout the 5-day period. Wear a well-fitting mask for 10 days after your date of last close contact when around others at home and in public. Avoid people who are immunocompromised or at high risk for severe disease, and nursing homes and other high-risk settings, until after at least 10 days. If possible, stay away from people you live with, especially people who are at higher risk for getting very sick from COVID-19, as well as others outside your home throughout the full 10 days after your last close contact with someone with COVID-19. If you are unable to quarantine, you should wear a well-fitting mask for 10 days when around others at home and in public. If you are unable to wear a mask when around others, you should continue to quarantine for 10 days. Avoid people who are immunocompromised or at high risk for severe disease, and nursing homes and other high-risk settings, until after at least 10 days. See additional information about travel. Do not go to places where you are unable to wear a mask, such as restaurants and some gyms, and avoid eating around others at home and at work until after 10 days after your last close contact with someone with COVID-19. After quarantine Watch for symptoms until 10 days after your last close contact with someone with COVID-19. If you have symptoms, isolate immediately and get tested. Quarantine in high-risk congregate settings In certain congregate settings that have high risk of secondary transmission  (such as correctional and detention facilities, homeless shelters, or cruise ships), CDC recommends a 10-day quarantine for residents, regardless of vaccination and booster status. During periods of critical staffing shortages, facilities may consider shortening the quarantine period for staff to ensure continuity of operations. Decisions to shorten quarantine in these settings should be made in consultation with state, local, tribal, or territorial health departments and should take into consideration the context and characteristics of the facility. CDC's setting-specific guidance provides additional recommendations for these settings. Isolation Isolation is used to separate people with confirmed or suspected COVID-19 from those without COVID-19. People who are in isolation should stay   home until it's safe for them to be around others. At home, anyone sick or infected should separate from others, or wear a well-fitting mask when they need to be around others. People in isolation should stay in a specific "sick room" or area and use a separate bathroom if available. Everyone who has presumed or confirmed COVID-19 should stay home and isolate from other people for at least 5 full days (day 0 is the first day of symptoms or the date of the day of the positive viral test for asymptomatic persons). They should wear a mask when around others at home and in public for an additional 5 days. People who are confirmed to have COVID-19 or are showing symptoms of COVID-19 need to isolate regardless of their vaccination status. This includes: People who have a positive viral test for COVID-19, regardless of whether or not they have symptoms. People with symptoms of COVID-19, including people who are awaiting test results or have not been tested. People with symptoms should isolate even if they do not know if they have been in close contact with someone with COVID-19. What to do for isolation Monitor your symptoms. If you  have an emergency warning sign (including trouble breathing), seek emergency medical care immediately. Stay in a separate room from other household members, if possible. Use a separate bathroom, if possible. Take steps to improve ventilation at home, if possible. Avoid contact with other members of the household and pets. Don't share personal household items, like cups, towels, and utensils. Wear a well-fitting mask when you need to be around other people. Learn more about what to do if you are sick and how to notify your contacts. Ending isolation for people who had COVID-19 and had symptoms If you had COVID-19 and had symptoms, isolate for at least 5 days. To calculate your 5-day isolation period, day 0 is your first day of symptoms. Day 1 is the first full day after your symptoms developed. You can leave isolation after 5 full days. You can end isolation after 5 full days if you are fever-free for 24 hours without the use of fever-reducing medication and your other symptoms have improved (Loss of taste and smell may persist for weeks or months after recovery and need not delay the end of isolation). You should continue to wear a well-fitting mask around others at home and in public for 5 additional days (day 6 through day 10) after the end of your 5-day isolation period. If you are unable to wear a mask when around others, you should continue to isolate for a full 10 days. Avoid people who are immunocompromised or at high risk for severe disease, and nursing homes and other high-risk settings, until after at least 10 days. If you continue to have fever or your other symptoms have not improved after 5 days of isolation, you should wait to end your isolation until you are fever-free for 24 hours without the use of fever-reducing medication and your other symptoms have improved. Continue to wear a well-fitting mask. Contact your healthcare provider if you have questions. See additional information about  travel. Do not go to places where you are unable to wear a mask, such as restaurants and some gyms, and avoid eating around others at home and at work until a full 10 days after your first day of symptoms. If an individual has access to a test and wants to test, the best approach is to use an antigen test1 towards the end of   the 5-day isolation period. Collect the test sample only if you are fever-free for 24 hours without the use of fever-reducing medication and your other symptoms have improved (loss of taste and smell may persist for weeks or months after recovery and need not delay the end of isolation). If your test result is positive, you should continue to isolate until day 10. If your test result is negative, you can end isolation, but continue to wear a well-fitting mask around others at home and in public until day 10. Follow additional recommendations for masking and avoiding travel as described above. 1As noted in the labeling for authorized over-the counter antigen tests: Negative results should be treated as presumptive. Negative results do not rule out SARS-CoV-2 infection and should not be used as the sole basis for treatment or patient management decisions, including infection control decisions. To improve results, antigen tests should be used twice over a three-day period with at least 24 hours and no more than 48 hours between tests. Note that these recommendations on ending isolation do not apply to people with moderate or severe COVID-19 or with weakened immune systems (immunocompromised). See section below for recommendations for when toend isolation for these groups. Ending isolation for people who tested positive for COVID-19 but had no symptoms If you test positive for COVID-19 and never develop symptoms, isolate for at least 5 days. Day 0 is the day of your positive viral test (based on the date you were tested) and day 1 is the first full day after the specimen was collected for your  positive test. You can leave isolation after 5 full days. If you continue to have no symptoms, you can end isolation after at least 5 days. You should continue to wear a well-fitting mask around others at home and in public until day 10 (day 6 through day 10). If you are unable to wear a mask when around others, you should continue to isolate for 10 days. Avoid people who are immunocompromised or at high risk for severe disease, and nursing homes and other high-risk settings, until after at least 10 days. If you develop symptoms after testing positive, your 5-day isolation period should start over. Day 0 is your first day of symptoms. Follow the recommendations above for ending isolation for people who had COVID-19 and had symptoms. See additional information about travel. Do not go to places where you are unable to wear a mask, such as restaurants and some gyms, and avoid eating around others at home and at work until 10 days after the day of your positive test. If an individual has access to a test and wants to test, the best approach is to use an antigen test1 towards the end of the 5-day isolation period. If your test result is positive, you should continue to isolate until day 10. If your test result is negative, you can end isolation, but continue to wear a well-fitting mask around others at home and in public until day 10. Follow additionalrecommendations for masking and avoiding travel as described above. 1As noted in the labeling for authorized over-the counter antigen tests: Negative results should be treated as presumptive. Negative results do not rule out SARS-CoV-2 infection and should not be used as the sole basis for treatment or patient management decisions, including infection control decisions. To improve results, antigen tests should be used twice over a three-day period with at least 24 hours and no more than 48 hours between tests. Ending isolation for people who were   severely ill with  COVID-19 or have a weakened immune system (immunocompromised) People who are severely ill with COVID-19 (including those who were hospitalized or required intensive care or ventilation support) and people with compromised immune systems might need to isolate at home longer. They may also require testing with a viral test to determine when they can be around others. CDC recommends an isolation period of at least 10 and up to 20 days for people who were severely ill with COVID-19 and for people with weakened immune systems. Consult with your healthcare provider about when you can resume being aroundother people. People who are immunocompromised should talk to their healthcare provider about the potential for reduced immune responses to COVID-19 vaccines and the need to continue to follow current prevention measures (including wearing a well-fitting mask, staying 6 feet apart from others they don't live with, and avoiding crowds and poorly ventilated indoor spaces) to protect themselves against COVID-19 until advised otherwise by their healthcare provider. Close contacts of immunocompromised people--including household members--should also be encouraged to receive all recommended COVID-19 vaccine doses to help protect these people. Isolation in high-risk congregate settings In certain high-risk congregate settings that have high risk of secondary transmission and where it is not feasible to cohort people (such as correctional and detention facilities, homeless shelters, and cruise ships), CDC recommends a 10-day isolation period for residents. During periods of critical staffing shortages, facilities may consider shortening the isolation period for staff to ensure continuity of operations. Decisions to shorten isolation in these settings should be made in consultation with state, local, tribal, or territorial health departments and should take into consideration the context and characteristics of the facility.  CDC's setting-specific guidance provides additional recommendations for these settings. This CDC guidance is meant to supplement--not replace--any federal, state, local,territorial, or tribal health and safety laws, rules, and regulations. Recommendations for specific settings These recommendations do not apply to healthcare professionals. For guidance specific to these settings, see Healthcare professionals: Interim Guidance for Managing Healthcare Personnel with SARS-CoV-2 Infection or Exposure to SARS-CoV-2 Patients, residents, and visitors to healthcare settings: Interim Infection Prevention and Control Recommendations for Healthcare Personnel During the Coronavirus Disease 2019 (COVID-19) Pandemic Additional setting-specific guidance and recommendations are available. These recommendations on quarantine and isolation do apply to K-12 School settings. Additional guidance is available here: Overview of COVID-19 Quarantine for K-12 Schools Travelers: Travel information and recommendations Congregate facilities and other settings: guidance pages for community, work, and school settings Ongoing COVID-19 exposure FAQs I live with someone with COVID-19, but I cannot be separated from them. How do we manage quarantine in this situation? It is very important for people with COVID-19 to remain apart from other people, if possible, even if they are living together. If separation of the person with COVID-19 from others that they live with is not possible, the other people that they live with will have ongoing exposure, meaning they will be repeatedly exposed until that person is no longer able to spread the virus to other people. In this situation, there are precautions you can take to limit the spread of COVID-19: The person with COVID-19 and everyone they live with should wear a well-fitting mask inside the home. If possible, one person should care for the person with COVID-19 to limit the number of people  who are in close contact with the infected person. Take steps to protect yourself and others to reduce transmission in the home: Quarantine if you are not up to date with your COVID-19   vaccines. Isolate if you are sick or tested positive for COVID-19, even if you don't have symptoms. Learn more about the public health recommendations for testing, mask use and quarantine of close contacts, like yourself, who have ongoing exposure. These recommendations differ depending on your vaccination status. What should I do if I have ongoing exposure to COVID-19 from someone I live with? Recommendations for this situation depend on your vaccination status: If you are not up to date on COVID-19 vaccines and have ongoing exposure to COVID-19, you should: Begin quarantine immediately and continue to quarantine throughout the isolation period of the person with COVID-19. Continue to quarantine for an additional 5 days starting the day after the end of isolation for the person with COVID-19. Get tested at least 5 days after the end of isolation of the infected person that lives with them. If you test negative, you can leave the home but should continue to wear a well-fitting mask when around others at home and in public until 10 days after the end of isolation for the person with COVID-19. Isolate immediately if you develop symptoms of COVID-19 or test positive. If you are up to date with COVID-19 vaccines and have ongoing exposure to COVID-19, you should: Get tested at least 5 days after your first exposure. A person with COVID-19 is considered infectious starting 2 days before they develop symptoms, or 2 days before the date of their positive test if they do not have symptoms. Get tested again at least 5 days after the end of isolation for the person with COVID-19. Wear a well-fitting mask when you are around the person with COVID-19, and do this throughout their isolation period. Wear a well-fitting mask around  others for 10 days after the infected person's isolation period ends. Isolate immediately if you develop symptoms of COVID-19 or test positive. What should I do if multiple people I live with test positive for COVID-19 at different times? Recommendations for this situation depend on your vaccination status: If you are not up to date with your COVID-19 vaccines, you should: Quarantine throughout the isolation period of any infected person that you live with. Continue to quarantine until 5 days after the end of isolation date for the most recently infected person that lives with you. For example, if the last day of isolation of the person most recently infected with COVID-19 was June 30, the new 5-day quarantine period starts on July 1. Get tested at least 5 days after the end of isolation for the most recently infected person that lives with you. Wear a well-fitting mask when you are around any person with COVID-19 while that person is in isolation. Wear a well-fitting mask when you are around other people until 10 days after your last close contact. Isolate immediately if you develop symptoms of COVID-19 or test positive. If you are up to date with COVID-19 your vaccines , you should: Get tested at least 5 days after your first exposure. A person with COVID-19 is considered infectious starting 2 days before they developed symptoms, or 2 days before the date of their positive test if they do not have symptoms. Get tested again at least 5 days after the end of isolation for the most recently infected person that lives with you. Wear a well-fitting mask when you are around any person with COVID-19 while that person is in isolation. Wear a well-fitting mask around others for 10 days after the end of isolation for the most recently infected person that   lives with you. For example, if the last day of isolation for the person most recently infected with COVID-19 was June 30, the new 10-day period to wear a  well-fitting mask indoors in public starts on July 1. Isolate immediately if you develop symptoms of COVID-19 or test positive. I had COVID-19 and completed isolation. Do I have to quarantine or get tested if someone I live with gets COVID-19 shortly after I completed isolation? No. If you recently completed isolation and someone that lives with you tests positive for the virus that causes COVID-19 shortly after the end of your isolation period, you do not have to quarantine or get tested as long as you do not develop new symptoms. Once all of the people that live together have completed isolation or quarantine, refer to the guidance below for new exposures to COVID-19. If you had COVID-19 in the previous 90 days and then came into close contact with someone with COVID-19, you do not have to quarantine or get tested if you do not have symptoms. But you should: Wear a well-fitting mask indoors in public for 10 days after exposure. Monitor for COVID-19 symptoms and isolate immediately if symptoms develop. Consult with a healthcare provider for testing recommendations if new symptoms develop. If more than 90 days have passed since your recovery from infection, follow CDC's recommendations for close contacts. These recommendations will differ depending on your vaccination status. 06/01/2020 Content source: National Center for Immunization and Respiratory Diseases (NCIRD), Division of Viral Diseases This information is not intended to replace advice given to you by your health care provider. Make sure you discuss any questions you have with your healthcare provider. Document Revised: 06/09/2020 Document Reviewed: 06/09/2020 Elsevier Patient Education  2022 Elsevier Inc.  

## 2020-12-26 ENCOUNTER — Other Ambulatory Visit: Payer: Self-pay | Admitting: Nurse Practitioner

## 2020-12-28 ENCOUNTER — Encounter: Payer: Medicare (Managed Care) | Admitting: Obstetrics and Gynecology

## 2021-01-11 ENCOUNTER — Ambulatory Visit (INDEPENDENT_AMBULATORY_CARE_PROVIDER_SITE_OTHER): Payer: Medicare Other | Admitting: Physician Assistant

## 2021-01-11 ENCOUNTER — Encounter: Payer: Self-pay | Admitting: Physician Assistant

## 2021-01-11 ENCOUNTER — Other Ambulatory Visit: Payer: Self-pay

## 2021-01-11 DIAGNOSIS — L2089 Other atopic dermatitis: Secondary | ICD-10-CM

## 2021-01-11 MED ORDER — TACROLIMUS 0.1 % EX OINT: TOPICAL_OINTMENT | Freq: Two times a day (BID) | CUTANEOUS | 0 refills | Status: DC

## 2021-01-15 ENCOUNTER — Telehealth: Payer: Self-pay | Admitting: *Deleted

## 2021-01-15 ENCOUNTER — Telehealth: Payer: Self-pay | Admitting: Physician Assistant

## 2021-01-15 NOTE — Telephone Encounter (Signed)
Says she contacted insurance company Friday and was told she's approved for Dupixent.

## 2021-01-15 NOTE — Telephone Encounter (Signed)
Fax from [facility]- they have received our prescription referral and are verifying information for Dupixent.

## 2021-01-17 ENCOUNTER — Telehealth: Payer: Self-pay | Admitting: Physician Assistant

## 2021-01-17 NOTE — Telephone Encounter (Signed)
Left message stating that once we get the Dupixent we will call her and set up nurse to see visit for injection training.

## 2021-01-17 NOTE — Telephone Encounter (Signed)
Patient was told to contact her insurance company and see if they will cover Dupixent. Patient is calling back to let you know that they will cover this medication for her.

## 2021-01-18 ENCOUNTER — Encounter: Payer: Self-pay | Admitting: Physician Assistant

## 2021-01-18 NOTE — Progress Notes (Signed)
   New Patient   Subjective  Erika Frey is a 25 y.o. female who presents for the following: Eczema (Here for eczema on feet, knees, face mostly. Patient is worse in winter. PCP gives patient triamcinolone but it doesn't help very much. Patient has been dealing with this all her life. It itches pretty bad per patient. All she uses is dove sensitive soap. Ryvent was not covered but patient tried samples).   The following portions of the chart were reviewed this encounter and updated as appropriate:  Tobacco  Allergies  Meds  Problems  Med Hx  Surg Hx  Fam Hx      Objective  Well appearing patient in no apparent distress; mood and affect are within normal limits.  A full examination was performed including scalp, head, eyes, ears, nose, lips, neck, chest, axillae, abdomen, back, buttocks, bilateral upper extremities, bilateral lower extremities, hands, feet, fingers, toes, fingernails, and toenails. All findings within normal limits unless otherwise noted below.  Head, Left Foot - Anterior, Left Leg, Right Foot - Anterior, Right Leg Xerosis and Post inflammatory hyperpigmentation.  Assessment & Plan  Other atopic dermatitis Left Foot - Anterior; Right Foot - Anterior; Head; Left Leg; Right Leg  Dupixent new start paper work started and faxed   tacrolimus (PROTOPIC) 0.1 % ointment - Head, Left Foot - Anterior, Left Leg, Right Foot - Anterior, Right Leg Apply topically 2 (two) times daily. Large body surface area    I, Edy Mcbane, PA-C, have reviewed all documentation's for this visit.  The documentation on 01/18/21 for the exam, diagnosis, procedures and orders are all accurate and complete.

## 2021-01-22 ENCOUNTER — Telehealth: Payer: Self-pay | Admitting: *Deleted

## 2021-01-22 NOTE — Telephone Encounter (Signed)
Called to give her [facility] contact  [phone] info to get dupixent delivered.

## 2021-01-22 NOTE — Telephone Encounter (Signed)
Patient is calling to see if her Dupixent has come in yet and if so wants to schedule an appointment for injection training.

## 2021-01-22 NOTE — Telephone Encounter (Signed)
I called [facility] to check status of Dupixent- per Michelene Heady they have tried calling patient multiple times with no luck.  I then verified the phone number and [facility] did have the correct number. I then called patient to let her know that Michelene Heady has been trying to contact her. I gave patient number to contact [facility] to complete welcome call nad schedule deliver. Patient understood and didn't need injection training- Mom already does injection and will give her injections as well. Told to call us if needed.

## 2021-01-25 ENCOUNTER — Ambulatory Visit (INDEPENDENT_AMBULATORY_CARE_PROVIDER_SITE_OTHER): Payer: Medicare Other

## 2021-01-25 VITALS — Ht 66.0 in | Wt 329.0 lb

## 2021-01-25 DIAGNOSIS — Z0000 Encounter for general adult medical examination without abnormal findings: Secondary | ICD-10-CM | POA: Diagnosis not present

## 2021-01-25 NOTE — Progress Notes (Signed)
I connected with  Erika Frey today via telehealth video enabled device and verified that I am speaking with the correct person using two identifiers.   Location: Patient: home Provider: work  Persons participating in virtual visit: Erika Frey,  Elisha Ponder LPN  I discussed the limitations, risks, security and privacy concerns of performing an evaluation and management service by video and the availability of in person appointments. The patient expressed understanding and agreed to proceed.   Some vital signs may be absent or patient reported.     Subjective:   Erika Frey is a 24 y.o. female who presents for an Initial Medicare Annual Wellness Visit.  Review of Systems     Cardiac Risk Factors include: obesity (BMI >30kg/m2);sedentary lifestyle     Objective:    Today's Vitals   01/25/21 1534  Weight: (!) 329 lb (149.2 kg)  Height: 5\' 6"  (1.676 m)   Body mass index is 53.1 kg/m.  Advanced Directives 01/25/2021 01/02/2015 01/10/2014 12/16/2012  Does Patient Have a Medical Advance Directive? Yes No No Patient does not have advance directive;Patient would not like information  Type of 12/18/2012 Power of [place];Living will - - -  Copy of Healthcare Power of Attorney in Chart? No - copy requested - - -  Would patient like information on creating a medical advance directive? - No - patient declined information No - patient declined information -    Current Medications (verified) Outpatient Encounter Medications as of 01/25/2021  Medication Sig   albuterol (PROVENTIL HFA;VENTOLIN HFA) 108 (90 BASE) MCG/ACT inhaler Inhale 2 puffs into the lungs every 6 (six) hours as needed.   cetirizine (ZYRTEC) 10 MG tablet Take 1 tablet by mouth daily.   Fluocinolone Acetonide 0.01 % OIL Instill 5 drops twice daily into both ear(s) for 14 days. Then stop and use as needed for itching.   fluticasone (FLONASE) 50 MCG/ACT nasal spray SHAKE LIQUID AND USE 2 SPRAYS IN EACH  NOSTRIL DAILY   ibuprofen (ADVIL) 800 MG tablet Take 800 mg by mouth every 8 (eight) hours as needed.   ibuprofen (ADVIL,MOTRIN) 600 MG tablet take 1 tablet by mouth twice a day if needed   levocetirizine (XYZAL) 5 MG tablet Take 5 mg by mouth daily.   mometasone (ELOCON) 0.1 % ointment SMARTSIG:Sparingly Topical Twice Daily PRN   montelukast (SINGULAIR) 10 MG tablet Take 10 mg by mouth at bedtime.   montelukast (SINGULAIR) 10 MG tablet Take 1 tablet by mouth daily.   Norgestimate-Ethinyl Estradiol Triphasic (TRI-LO-SPRINTEC) 0.18/0.215/0.25 MG-25 MCG tab Take 1 tablet by mouth daily.   Olopatadine HCl 0.6 % SOLN Place 2 sprays into both nostrils 2 (two) times daily.   PATADAY 0.2 % SOLN    pseudoephedrine (SUDAFED) 60 MG tablet Take 1 tablet (60 mg total) by mouth every 8 (eight) hours as needed for congestion.   tacrolimus (PROTOPIC) 0.1 % ointment Apply topically 2 (two) times daily. Large body surface area   triamcinolone cream (KENALOG) 0.1 % apply to affected area twice a day   Vitamin D, Ergocalciferol, (DRISDOL) 1.25 MG (50000 UNIT) CAPS capsule Take 1 capsule (50,000 Units total) by mouth every 7 (seven) days.   benzonatate (TESSALON PERLES) 100 MG capsule Take 1 capsule (100 mg total) by mouth every 6 (six) hours as needed. (Patient not taking: No sig reported)   No facility-administered encounter medications on file as of 01/25/2021.    Allergies (verified) Penicillins   History: Past Medical History:  Diagnosis Date  Absent menses    MOTHER STATES PT STARTED PERIOD AT AGE 89 BUT LAST PERIOD WAS 2 YRS AGO , AGE 53   Asthma    Exotropia of left eye    Immunizations up to date    Simple constipation    OCCASIONAL   Sinus headache    Past Surgical History:  Procedure Laterality Date   FOOT SURGERY  AGE 66   MEDIAN RECTUS REPAIR Left 12/16/2012   Procedure: MEDIAN RECTUS RESECTION LEFT EYE;  Surgeon: Corinda Gubler, MD;  Location: [facility];  Service:  Ophthalmology;  Laterality: Left;   MUSCLE RECESSION AND RESECTION Left 12/16/2012   Procedure: LATERAL RECTUS RECESSION LEFT EYE;  Surgeon: Corinda Gubler, MD;  Location: [facility];  Service: Ophthalmology;  Laterality: Left;   TONSILLECTOMY AND ADENOIDECTOMY  AGE 72   Family History  Problem Relation Age of Onset   Hypertension Mother    Diabetes Mother    Heart disease Mother    Hypertension Maternal Grandmother    Heart disease Maternal Grandmother    Social History   Socioeconomic History   Marital status: Single    Spouse name: Not on file   Number of children: Not on file   Years of education: Not on file   Highest education level: Not on file  Occupational History   Occupation: self   Tobacco Use   Smoking status: Never   Smokeless tobacco: Never   Tobacco comments:    NO SMOKER IN HOME  Vaping Use   Vaping Use: Never used  Substance and Sexual Activity   Alcohol use: No    Alcohol/week: 0.0 standard drinks   Drug use: No   Sexual activity: Yes    Birth control/protection: Pill  Other Topics Concern   Not on file  Social History Narrative   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   Social Determinants of Health   Financial Resource Strain: Low Risk    Difficulty of Paying Living Expenses: Not hard at all  Food Insecurity: No Food Insecurity   Worried About Programme researcher, broadcasting/film/video in the Last Year: Never true   Ran Out of Food in the Last Year: Never true  Transportation Needs: No Transportation Needs   Lack of Transportation (Medical): No   Lack of Transportation (Non-Medical): No  Physical Activity: Inactive   Days of Exercise per Week: 0 days   Minutes of Exercise per Session: 0 min  Stress: No Stress Concern Present   Feeling of Stress : Not at all  Social Connections: Not on file    Tobacco Counseling Counseling given: Not Answered Tobacco comments: NO SMOKER IN HOME   Clinical Intake:  Pre-visit preparation completed: Yes  Pain :  No/denies pain     Nutritional Status: BMI > 30  Obese Nutritional Risks: None Diabetes: No  How often do you need to have someone help you when you read instructions, pamphlets, or other written materials from your doctor or pharmacy?: 1 - Never 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 no  Interpreter Needed?: No  Information entered by :: NAllen LPN   Activities of Daily Living In your present state of health, do you have any difficulty performing the following activities: 01/25/2021  Hearing? N  Vision? N  Difficulty concentrating or making decisions? N  Walking or climbing stairs? N  Dressing or bathing? N  Doing errands, shopping? N  Preparing Food and eating ?  N  Using the Toilet? N  In the past six months, have you accidently leaked urine? N  Do you have problems with loss of bowel control? N  Managing your Medications? N  Managing your Finances? N  Housekeeping or managing your Housekeeping? N  Some recent data might be hidden    Patient Care Team: Charlesetta Ivory, NP as PCP - General (Nurse Practitioner) Glyn Ade, PA-C as Physician Assistant (Dermatology)  Indicate any recent Medical Services you may have received from other than [facility] providers in the past year (date may be approximate).     Assessment:   This is a routine wellness examination for Erika [place].  Hearing/Vision screen Vision Screening - Comments:: Regular eye exams, [facility]  Dietary issues and exercise activities discussed: Current Exercise Habits: The patient does not participate in regular exercise at present   Goals Addressed             This Visit's Progress    Patient Stated       01/25/2021, wants to lose weight       Depression Screen PHQ 2/9 Scores 01/25/2021 08/02/2020  PHQ - 2 Score 0 0    Fall Risk Fall Risk  01/25/2021 08/02/2020  Falls in the past year? 0 0  Number falls in past yr: - 0  Risk for fall due to :  Medication side effect -  Follow up Falls evaluation completed;Education provided;Falls prevention discussed -    FALL RISK PREVENTION PERTAINING TO THE HOME:  Any stairs in or around the home? No  If so, are there any without handrails?  N/a Home free of loose throw rugs in walkways, pet beds, electrical cords, etc? Yes  Adequate lighting in your home to reduce risk of falls? Yes   ASSISTIVE DEVICES UTILIZED TO PREVENT FALLS:  Life alert? No  Use of a cane, walker or w/c? No  Grab bars in the bathroom? No  Shower chair or bench in shower? Yes  Elevated toilet seat or a handicapped toilet? No   TIMED UP AND GO:  Was the test performed? No .      Cognitive Function:     6CIT Screen 01/25/2021  What Year? 0 points  What month? 0 points  What time? 0 points  Count back from 20 0 points  Months in reverse 0 points  Repeat phrase 2 points  Total Score 2    Immunizations Immunization History  Administered Date(s) Administered   HPV 9-valent 03/07/2008, 05/17/2008, 10/31/2008   PFIZER(Purple Top)SARS-COV-2 Vaccination 01/14/2020, 02/04/2020, 02/04/2020   Tdap 05/17/2008, 11/02/2020    TDAP status: Up to date  Flu Vaccine status: Due, Education has been provided regarding the importance of this vaccine. Advised may receive this vaccine at local pharmacy or Health Dept. Aware to provide a copy of the vaccination record if obtained from local pharmacy or Health Dept. Verbalized acceptance and understanding.  Pneumococcal vaccine status: Up to date  Covid-19 vaccine status: Completed vaccines  Qualifies for Shingles Vaccine? No   Zostavax completed No   Shingrix Completed?: n/a  Screening Tests Health Maintenance  Topic Date Due   PAP-Cervical Cytology Screening  Never done   PAP SMEAR-Modifier  Never done   COVID-19 Vaccine (3 - Booster for Pfizer series) 07/04/2020   INFLUENZA VACCINE  Never done   TETANUS/TDAP  11/03/2030   HPV VACCINES  Completed    Hepatitis C Screening  Completed   HIV Screening  Completed    Health Maintenance  Health Maintenance Due  Topic Date Due   PAP-Cervical Cytology Screening  Never done   PAP SMEAR-Modifier  Never done   COVID-19 Vaccine (3 - Booster for Pfizer series) 07/04/2020   INFLUENZA VACCINE  Never done    Colorectal cancer screening: n/a  Mammogram status: n/a  Bone Density status: n/a  Lung Cancer Screening: (Low Dose CT Chest recommended if Age 72-80 years, 30 pack-year currently smoking OR have quit w/in 15years.) does not qualify.   Lung Cancer Screening Referral: no  Additional Screening:  Hepatitis C Screening: does qualify; Completed 11/02/2020  Vision Screening: Recommended annual ophthalmology exams for early detection of glaucoma and other disorders of the eye. Is the patient up to date with their annual eye exam?  Yes  Who is the provider or what is the name of the office in which the patient attends annual eye exams? [facility] If pt is not established with a provider, would they like to be referred to a provider to establish care? No .   Dental Screening: Recommended annual dental exams for proper oral hygiene  Community Resource Referral / Chronic Care Management: CRR required this visit?  No   CCM required this visit?  No      Plan:     I have personally reviewed and noted the following in the patient's chart:   Medical and social history Use of alcohol, tobacco or illicit drugs  Current medications and supplements including opioid prescriptions. Patient is not currently taking opioid prescriptions. Functional ability and status Nutritional status Physical activity Advanced directives List of other physicians Hospitalizations, surgeries, and ER visits in previous 12 months Vitals Screenings to include cognitive, depression, and falls Referrals and appointments  In addition, I have reviewed and discussed with patient certain preventive protocols,  quality metrics, and best practice recommendations. A written personalized care plan for preventive services as well as general preventive health recommendations were provided to patient.     Barb Merino, LPN   [id number]/85/[id number]   Nurse Notes:

## 2021-01-25 NOTE — Patient Instructions (Signed)
Erika Frey , Thank you for taking time to come for your Medicare Wellness Visit. I appreciate your ongoing commitment to your health goals. Please review the following plan we discussed and let me know if I can assist you in the future.   Screening recommendations/referrals: Colonoscopy: n/a Mammogram: n/a Bone Density: n/a Recommended yearly ophthalmology/optometry visit for glaucoma screening and checkup Recommended yearly dental visit for hygiene and checkup  Vaccinations: Influenza vaccine: due Pneumococcal vaccine: n/a Tdap vaccine: completed 11/02/2020, due 11/03/2030 Shingles vaccine: n/a  Covid-19:  01/14/2020, 02/04/2020  Advanced directives: Please bring a copy of your POA (Power of Attorney) and/or Living Will to your next appointment.   Conditions/risks identified: none  Next appointment: Follow up in one year for your annual wellness visit.   Preventive Care 40-64 Years, Female Preventive care refers to lifestyle choices and visits with your health care provider that can promote health and wellness. What does preventive care include? A yearly physical exam. This is also called an annual well check. Dental exams once or twice a year. Routine eye exams. Ask your health care provider how often you should have your eyes checked. Personal lifestyle choices, including: Daily care of your teeth and gums. Regular physical activity. Eating a healthy diet. Avoiding tobacco and drug use. Limiting alcohol use. Practicing safe sex. Taking low-dose aspirin daily starting at age 77. Taking vitamin and mineral supplements as recommended by your health care provider. What happens during an annual well check? The services and screenings done by your health care provider during your annual well check will depend on your age, overall health, lifestyle risk factors, and family history of disease. Counseling  Your health care provider may ask you questions about your: Alcohol  use. Tobacco use. Drug use. Emotional well-being. Home and relationship well-being. Sexual activity. Eating habits. Work and work Statistician. Method of birth control. Menstrual cycle. Pregnancy history. Screening  You may have the following tests or measurements: Height, weight, and BMI. Blood pressure. Lipid and cholesterol levels. These may be checked every 5 years, or more frequently if you are over 68 years old. Skin check. Lung cancer screening. You may have this screening every year starting at age 17 if you have a 30-pack-year history of smoking and currently smoke or have quit within the past 15 years. Fecal occult blood test (FOBT) of the stool. You may have this test every year starting at age 39. Flexible sigmoidoscopy or colonoscopy. You may have a sigmoidoscopy every 5 years or a colonoscopy every 10 years starting at age 39. Hepatitis C blood test. Hepatitis B blood test. Sexually transmitted disease (STD) testing. Diabetes screening. This is done by checking your blood sugar (glucose) after you have not eaten for a while (fasting). You may have this done every 1-3 years. Mammogram. This may be done every 1-2 years. Talk to your health care provider about when you should start having regular mammograms. This may depend on whether you have a family history of breast cancer. BRCA-related cancer screening. This may be done if you have a family history of breast, ovarian, tubal, or peritoneal cancers. Pelvic exam and Pap test. This may be done every 3 years starting at age 71. Starting at age 12, this may be done every 5 years if you have a Pap test in combination with an HPV test. Bone density scan. This is done to screen for osteoporosis. You may have this scan if you are at high risk for osteoporosis. Discuss your test results, treatment  options, and if necessary, the need for more tests with your health care provider. Vaccines  Your health care provider may recommend  certain vaccines, such as: Influenza vaccine. This is recommended every year. Tetanus, diphtheria, and acellular pertussis (Tdap, Td) vaccine. You may need a Td booster every 10 years. Zoster vaccine. You may need this after age 26. Pneumococcal 13-valent conjugate (PCV13) vaccine. You may need this if you have certain conditions and were not previously vaccinated. Pneumococcal polysaccharide (PPSV23) vaccine. You may need one or two doses if you smoke cigarettes or if you have certain conditions. Talk to your health care provider about which screenings and vaccines you need and how often you need them. This information is not intended to replace advice given to you by your health care provider. Make sure you discuss any questions you have with your health care provider. Document Released: 05/19/2015 Document Revised: 01/10/2016 Document Reviewed: 02/21/2015 Elsevier Interactive Patient Education  2017 [employer] Prevention in the Home Falls can cause injuries. They can happen to people of all ages. There are many things you can do to make your home safe and to help prevent falls. What can I do on the outside of my home? Regularly fix the edges of walkways and driveways and fix any cracks. Remove anything that might make you trip as you walk through a door, such as a raised step or threshold. Trim any bushes or trees on the path to your home. Use bright outdoor lighting. Clear any walking paths of anything that might make someone trip, such as rocks or tools. Regularly check to see if handrails are loose or broken. Make sure that both sides of any steps have handrails. Any raised decks and porches should have guardrails on the edges. Have any leaves, snow, or ice cleared regularly. Use sand or salt on walking paths during winter. Clean up any spills in your garage right away. This includes oil or grease spills. What can I do in the bathroom? Use night lights. Install grab bars  by the toilet and in the tub and shower. Do not use towel bars as grab bars. Use non-skid mats or decals in the tub or shower. If you need to sit down in the shower, use a plastic, non-slip stool. Keep the floor dry. Clean up any water that spills on the floor as soon as it happens. Remove soap buildup in the tub or shower regularly. Attach bath mats securely with double-sided non-slip rug tape. Do not have throw rugs and other things on the floor that can make you trip. What can I do in the bedroom? Use night lights. Make sure that you have a light by your bed that is easy to reach. Do not use any sheets or blankets that are too big for your bed. They should not hang down onto the floor. Have a firm chair that has side arms. You can use this for support while you get dressed. Do not have throw rugs and other things on the floor that can make you trip. What can I do in the kitchen? Clean up any spills right away. Avoid walking on wet floors. Keep items that you use a lot in easy-to-reach places. If you need to reach something above you, use a strong step stool that has a grab bar. Keep electrical cords out of the way. Do not use floor polish or wax that makes floors slippery. If you must use wax, use non-skid floor wax. Do  not have throw rugs and other things on the floor that can make you trip. What can I do with my stairs? Do not leave any items on the stairs. Make sure that there are handrails on both sides of the stairs and use them. Fix handrails that are broken or loose. Make sure that handrails are as long as the stairways. Check any carpeting to make sure that it is firmly attached to the stairs. Fix any carpet that is loose or worn. Avoid having throw rugs at the top or bottom of the stairs. If you do have throw rugs, attach them to the floor with carpet tape. Make sure that you have a light switch at the top of the stairs and the bottom of the stairs. If you do not have them, ask  someone to add them for you. What else can I do to help prevent falls? Wear shoes that: Do not have high heels. Have rubber bottoms. Are comfortable and fit you well. Are closed at the toe. Do not wear sandals. If you use a stepladder: Make sure that it is fully opened. Do not climb a closed stepladder. Make sure that both sides of the stepladder are locked into place. Ask someone to hold it for you, if possible. Clearly mark and make sure that you can see: Any grab bars or handrails. First and last steps. Where the edge of each step is. Use tools that help you move around (mobility aids) if they are needed. These include: Canes. Walkers. Scooters. Crutches. Turn on the lights when you go into a dark area. Replace any light bulbs as soon as they burn out. Set up your furniture so you have a clear path. Avoid moving your furniture around. If any of your floors are uneven, fix them. If there are any pets around you, be aware of where they are. Review your medicines with your doctor. Some medicines can make you feel dizzy. This can increase your chance of falling. Ask your doctor what other things that you can do to help prevent falls. This information is not intended to replace advice given to you by your health care provider. Make sure you discuss any questions you have with your health care provider. Document Released: 02/16/2009 Document Revised: 09/28/2015 Document Reviewed: 05/27/2014 Elsevier Interactive Patient Education  2017 [employer].

## 2021-01-28 ENCOUNTER — Encounter: Payer: Self-pay | Admitting: Nurse Practitioner

## 2021-02-01 DIAGNOSIS — M2041 Other hammer toe(s) (acquired), right foot: Secondary | ICD-10-CM | POA: Diagnosis not present

## 2021-02-01 DIAGNOSIS — M79671 Pain in right foot: Secondary | ICD-10-CM | POA: Diagnosis not present

## 2021-02-01 DIAGNOSIS — M76822 Posterior tibial tendinitis, left leg: Secondary | ICD-10-CM | POA: Diagnosis not present

## 2021-02-01 DIAGNOSIS — M21621 Bunionette of right foot: Secondary | ICD-10-CM | POA: Diagnosis not present

## 2021-02-01 DIAGNOSIS — M76821 Posterior tibial tendinitis, right leg: Secondary | ICD-10-CM | POA: Diagnosis not present

## 2021-02-01 DIAGNOSIS — M79672 Pain in left foot: Secondary | ICD-10-CM | POA: Diagnosis not present

## 2021-02-01 DIAGNOSIS — M21622 Bunionette of left foot: Secondary | ICD-10-CM | POA: Diagnosis not present

## 2021-02-08 ENCOUNTER — Encounter: Payer: Medicare (Managed Care) | Admitting: Obstetrics and Gynecology

## 2021-02-22 ENCOUNTER — Other Ambulatory Visit: Payer: Self-pay | Admitting: Nurse Practitioner

## 2021-02-26 ENCOUNTER — Other Ambulatory Visit: Payer: Self-pay

## 2021-02-26 ENCOUNTER — Telehealth: Payer: Self-pay | Admitting: Physician Assistant

## 2021-02-26 ENCOUNTER — Encounter (HOSPITAL_COMMUNITY): Payer: Self-pay

## 2021-02-26 ENCOUNTER — Ambulatory Visit (HOSPITAL_COMMUNITY)
Admission: EM | Admit: 2021-02-26 | Discharge: 2021-02-26 | Disposition: A | Discharge status: Home / Self Care | Payer: Medicare Other

## 2021-02-26 DIAGNOSIS — M62838 Other muscle spasm: Secondary | ICD-10-CM | POA: Diagnosis not present

## 2021-02-26 HISTORY — DX: Dermatitis, unspecified: L30.9

## 2021-02-26 MED ORDER — TIZANIDINE HCL 4 MG PO TABS: 4.0000 mg | ORAL_TABLET | Freq: Four times a day (QID) | ORAL | 0 refills | Status: DC | PRN

## 2021-02-26 MED ORDER — IBUPROFEN 600 MG PO TABS: 600.0000 mg | ORAL_TABLET | Freq: Four times a day (QID) | ORAL | 0 refills | Status: DC | PRN

## 2021-02-26 NOTE — ED Provider Notes (Signed)
[facility]    CSN: [id number] Arrival date & time: 02/26/21  1630      History   Chief Complaint No chief complaint on file.   HPI Erika Frey is a 25 y.o. female.   Patient here for evaluation of right neck and back pain that has been ongoing for the past several days.  Reports having migraines as well as muscle aches since starting Dupixent.  Reports right-sided neck pain worse with movement.  Reports that she does have issues with her ears and follows ENT.  Spoke with dermatology earlier today and was instructed to stop Dupixent until she is able to follow-up with them.  Has not taken any OTC medications or treatments.  Denies any trauma, injury, or other precipitating event.  Denies any specific alleviating or aggravating factors.  Denies any fevers, chest pain, shortness of breath, N/V/D, numbness, tingling, weakness, abdominal pain.    The history is provided by the patient.   Past Medical History:  Diagnosis Date   Absent menses    MOTHER STATES PT STARTED PERIOD AT AGE 3 BUT LAST PERIOD WAS 2 YRS AGO , AGE 67   Asthma    Eczema    Exotropia of left eye    Immunizations up to date    Simple constipation    OCCASIONAL   Sinus headache     Patient Active Problem List   Diagnosis Date Noted   Exotropia of left eye 12/15/2012    Past Surgical History:  Procedure Laterality Date   FOOT SURGERY  AGE 86   MEDIAN RECTUS REPAIR Left 12/16/2012   Procedure: MEDIAN RECTUS RESECTION LEFT EYE;  Surgeon: Corinda Gubler, MD;  Location: [facility];  Service: Ophthalmology;  Laterality: Left;   MUSCLE RECESSION AND RESECTION Left 12/16/2012   Procedure: LATERAL RECTUS RECESSION LEFT EYE;  Surgeon: Corinda Gubler, MD;  Location: [facility];  Service: Ophthalmology;  Laterality: Left;   TONSILLECTOMY AND ADENOIDECTOMY  AGE 53    OB History   No obstetric history on file.      Home Medications    Prior to Admission  medications   Medication Sig Start Date End Date Taking? Authorizing Provider  albuterol (PROVENTIL HFA;VENTOLIN HFA) 108 (90 BASE) MCG/ACT inhaler Inhale 2 puffs into the lungs every 6 (six) hours as needed.   Yes [provider]  cetirizine (ZYRTEC) 10 MG tablet Take 1 tablet by mouth daily. 05/30/20  Yes [provider]  Dupilumab (DUPIXENT [place]) Inject into the skin.   Yes [provider]  Fluocinolone Acetonide 0.01 % OIL Instill 5 drops twice daily into both ear(s) for 14 days. Then stop and use as needed for itching. 07/13/20  Yes [provider]  fluticasone (FLONASE) 50 MCG/ACT nasal spray SHAKE LIQUID AND USE 2 SPRAYS IN EACH NOSTRIL DAILY 12/14/20  Yes [provider]  ibuprofen (ADVIL) 600 MG tablet Take 1 tablet (600 mg total) by mouth every 6 (six) hours as needed. 02/26/21  Yes Ivette Loyal, NP  levocetirizine (XYZAL) 5 MG tablet Take 5 mg by mouth daily. 12/22/20  Yes [provider]  mometasone (ELOCON) 0.1 % ointment SMARTSIG:Sparingly Topical Twice Daily PRN 11/21/20  Yes [provider]  montelukast (SINGULAIR) 10 MG tablet Take 10 mg by mouth at bedtime.   Yes [provider]  montelukast (SINGULAIR) 10 MG tablet Take 1 tablet by mouth daily. 05/30/20  Yes [provider]  Norgestimate-Ethinyl Estradiol Triphasic (TRI-LO-SPRINTEC)  0.18/0.215/0.25 MG-25 MCG tab Take 1 tablet by mouth daily. 09/12/20  Yes [name], FNP  pseudoephedrine (SUDAFED) 60 MG tablet Take 1 tablet (60 mg total) by mouth every 8 (eight) hours as needed for congestion. 01/10/14  Yes Roxy Horseman, PA-C  tacrolimus (PROTOPIC) 0.1 % ointment Apply topically 2 (two) times daily. Large body surface area 01/11/21  Yes Sheffield, Kelli R, PA-C  tiZANidine (ZANAFLEX) 4 MG tablet Take 1 tablet (4 mg total) by mouth every 6 (six) hours as needed for muscle spasms. 02/26/21  Yes Ivette Loyal, NP  triamcinolone cream (KENALOG) 0.1 % apply  to affected area twice a day 09/18/20  Yes [name], FNP  Vitamin D, Ergocalciferol, (DRISDOL) 1.25 MG (50000 UNIT) CAPS capsule Take 1 capsule (50,000 Units total) by mouth every 7 (seven) days. 12/18/20  Yes Ghumman, Ramandeep, NP  benzonatate (TESSALON PERLES) 100 MG capsule Take 1 capsule (100 mg total) by mouth every 6 (six) hours as needed. Patient not taking: No sig reported 12/18/20 12/18/21  [name], FNP  Olopatadine HCl 0.6 % SOLN Place 2 sprays into both nostrils 2 (two) times daily. 09/21/20   [provider]  PATADAY 0.2 % SOLN  10/12/14   [provider]    Family History Family History  Problem Relation Age of Onset   Hypertension Mother    Diabetes Mother    Heart disease Mother    Hypertension Maternal Grandmother    Heart disease Maternal Grandmother     Social History Social History   Tobacco Use   Smoking status: Never   Smokeless tobacco: Never   Tobacco comments:    NO SMOKER IN HOME  Vaping Use   Vaping Use: Never used  Substance Use Topics   Alcohol use: No    Alcohol/week: 0.0 standard drinks   Drug use: No     Allergies   Penicillins   Review of Systems Review of Systems  Musculoskeletal:  Positive for neck pain.  Neurological:  Positive for headaches.  All other systems reviewed and are negative.   Physical Exam Triage Vital Signs ED Triage Vitals  Enc Vitals Group     BP 02/26/21 1807 112/83     Pulse Rate 02/26/21 1807 89     Resp 02/26/21 1807 18     Temp 02/26/21 1807 98.1 F (36.7 C)     Temp Source 02/26/21 1807 Oral     SpO2 02/26/21 1807 96 %     Weight --      Height --      Head Circumference --      Peak Flow --      Pain Score 02/26/21 1803 7     Pain Loc --      Pain Edu? --      Excl. in GC? --    No data found.  Updated Vital Signs BP 112/83 (BP Location: Left Arm)   Pulse 89   Temp 98.1 F (36.7 C) (Oral)   Resp 18   SpO2 96%   Visual Acuity Right Eye Distance:   Left Eye  Distance:   Bilateral Distance:    Right Eye Near:   Left Eye Near:    Bilateral Near:     Physical Exam Vitals and nursing note reviewed.  Constitutional:      General: She is not in acute distress.    Appearance: Normal appearance. She is not ill-appearing, toxic-appearing or diaphoretic.  HENT:     Head: Normocephalic  and atraumatic.     Right Ear: Tympanic membrane, ear canal and external ear normal. There is no impacted cerumen.  Eyes:     Conjunctiva/sclera: Conjunctivae normal.  Cardiovascular:     Rate and Rhythm: Normal rate.     Pulses: Normal pulses.  Pulmonary:     Effort: Pulmonary effort is normal.  Abdominal:     General: Abdomen is flat.  Musculoskeletal:     Cervical back: Spasms and tenderness present. No bony tenderness. Decreased range of motion (due to pain).  Skin:    General: Skin is warm and dry.  Neurological:     General: No focal deficit present.     Mental Status: She is alert and oriented to person, place, and time.  Psychiatric:        Mood and Affect: Mood normal.     UC Treatments / Results  Labs (all labs ordered are listed, but only abnormal results are displayed) Labs Reviewed - No data to display  EKG   Radiology No results found.  Procedures Procedures (including critical care time)  Medications Ordered in UC Medications - No data to display  Initial Impression / Assessment and Plan / UC Course  I have reviewed the triage vital signs and the nursing notes.  Pertinent labs & imaging results that were available during my care of the patient were reviewed by me and considered in my medical decision making (see chart for details).    Assessment negative for red flags or concerns.  Muscle spasms of the neck.  This is possibly a reaction to Dupixent so stopped taking medication and may take Benadryl.  Tizanidine and ibuprofen as needed for pain and muscle spasms.  Instructed not to take tizanidine prior to driving as it can  cause drowsiness.  May use heat, ice, or OTC pain relievers as described in discharge instructions.  Follow-up with primary care for reevaluation.  Strict ED follow-up for any worsening symptoms. Final Clinical Impressions(s) / UC Diagnoses   Final diagnoses:  Muscle spasms of neck     Discharge Instructions      Take the tizanidine as needed for muscle pain and spasms.  Do not take this prior to driving as it can make you sleepy. Take the ibuprofen as needed for pain.  You may also take Tylenol.  You can use heat, ice, or alternate between heat and ice for comfort.  You may use IcyHot, lidocaine patches, Biofreeze, Aspercreme, Bengay, or Voltaren gel to help with pain.  You can take a Benadryl tonight to help with possible reaction to medication.  Return or go to the Emergency Department if symptoms worsen or do not improve in the next few days.      ED Prescriptions     Medication Sig Dispense Auth. Provider   tiZANidine (ZANAFLEX) 4 MG tablet Take 1 tablet (4 mg total) by mouth every 6 (six) hours as needed for muscle spasms. 30 tablet Chales Salmon R, NP   ibuprofen (ADVIL) 600 MG tablet Take 1 tablet (600 mg total) by mouth every 6 (six) hours as needed. 30 tablet Ivette Loyal, NP      PDMP not reviewed this encounter.   Ivette Loyal, NP 02/26/21 [phone]

## 2021-02-26 NOTE — Telephone Encounter (Signed)
Patient is calling to say that she is having a problem with Dupixent.  Patient is states that she did the first shot and that she had a severe migraine.  Then she did the next shot in her other arm and developed another migraine and a very sore arm.  Last Wednesday, 02/21/2021, she did that shot in her stomach and again developed a migraine and had soreness at the injection site.  Patient states that since the migraine went away that she still has continued with a headache even today.  Patient wants to know what she should do.  She is due her next injection 03/07/2021 and I, Olegario Messier, told her told her to discontinue injections until she spoke with someone from this office.

## 2021-02-26 NOTE — Discharge Instructions (Signed)
Take the tizanidine as needed for muscle pain and spasms.  Do not take this prior to driving as it can make you sleepy. Take the ibuprofen as needed for pain.  You may also take Tylenol.  You can use heat, ice, or alternate between heat and ice for comfort.  You may use IcyHot, lidocaine patches, Biofreeze, Aspercreme, Bengay, or Voltaren gel to help with pain.  You can take a Benadryl tonight to help with possible reaction to medication.  Return or go to the Emergency Department if symptoms worsen or do not improve in the next few days.

## 2021-02-26 NOTE — ED Triage Notes (Signed)
Pt reports starting Dupixent injections for eczema and allergies.  Has self administered twice and reports HA following injections as well as arm pain.  Increased pain in R occipital/ear/neck areas when turning head right.

## 2021-03-01 NOTE — Telephone Encounter (Signed)
Phone call to patient with Mackey Birchwood, PA's recommendations on switching patient from Dupixent to Adbry. Patient aware of medication change. New prescription for Adbry sent to [facility] through the portal.

## 2021-03-06 NOTE — Telephone Encounter (Signed)
All information sent to [facility] for adbry and the approval from [facility]  [id number]  good through 05/05/2022

## 2021-03-14 ENCOUNTER — Other Ambulatory Visit: Payer: Self-pay | Admitting: Nurse Practitioner

## 2021-03-14 DIAGNOSIS — L309 Dermatitis, unspecified: Secondary | ICD-10-CM

## 2021-04-05 NOTE — Telephone Encounter (Signed)
Per [facility] after numerous unsuccessful attempts they are discontinuing the Adbry prescription.

## 2021-06-05 ENCOUNTER — Other Ambulatory Visit: Payer: Self-pay

## 2021-06-05 ENCOUNTER — Ambulatory Visit (INDEPENDENT_AMBULATORY_CARE_PROVIDER_SITE_OTHER): Payer: Medicare Other | Admitting: Nurse Practitioner

## 2021-06-05 ENCOUNTER — Encounter: Payer: Self-pay | Admitting: Nurse Practitioner

## 2021-06-05 VITALS — BP 112/78 | HR 89 | Temp 98.1°F | Ht 66.0 in | Wt 362.4 lb

## 2021-06-05 DIAGNOSIS — R5383 Other fatigue: Secondary | ICD-10-CM | POA: Diagnosis not present

## 2021-06-05 DIAGNOSIS — M79671 Pain in right foot: Secondary | ICD-10-CM | POA: Diagnosis not present

## 2021-06-05 DIAGNOSIS — Z6841 Body Mass Index (BMI) 40.0 and over, adult: Secondary | ICD-10-CM

## 2021-06-05 DIAGNOSIS — M79672 Pain in left foot: Secondary | ICD-10-CM

## 2021-06-05 DIAGNOSIS — L308 Other specified dermatitis: Secondary | ICD-10-CM

## 2021-06-05 MED ORDER — KETOROLAC TROMETHAMINE 60 MG/2ML IM SOLN
60.0000 mg | Freq: Once | INTRAMUSCULAR | Status: AC
  Administered 2021-06-05: 60 mg via INTRAMUSCULAR

## 2021-06-05 MED ORDER — CLOBETASOL PROPIONATE 0.05 % EX CREA: 1.0000 "application " | TOPICAL_CREAM | Freq: Two times a day (BID) | CUTANEOUS | 3 refills | Status: DC

## 2021-06-05 MED ORDER — PHENTERMINE HCL 15 MG PO CAPS: 15.0000 mg | ORAL_CAPSULE | ORAL | 1 refills | Status: DC

## 2021-06-05 MED ORDER — TRIAMCINOLONE ACETONIDE 40 MG/ML IJ SUSP
40.0000 mg | Freq: Once | INTRAMUSCULAR | Status: AC
Start: 2021-06-05 — End: 2021-06-05
  Administered 2021-06-05: 40 mg via INTRAMUSCULAR

## 2021-06-05 NOTE — Patient Instructions (Signed)
Foot Pain °Many things can cause foot pain. Some common causes are: °An injury. °A sprain. °Arthritis. °Blisters. °Bunions. °Follow these instructions at home: °Managing pain, stiffness, and swelling °If directed, put ice on the painful area: °Put ice in a plastic bag. °Place a towel between your skin and the bag. °Leave the ice on for 20 minutes, 2-3 times a day. ° °Activity °Do not stand or walk for long periods. °Return to your normal activities as told by your health care provider. Ask your health care provider what activities are safe for you. °Do stretches to relieve foot pain and stiffness as told by your health care provider. °Do not lift anything that is heavier than 10 lb (4.5 kg), or the limit that you are told, until your health care provider says that it is safe. Lifting a lot of weight can put added pressure on your feet. °Lifestyle °Wear comfortable, supportive shoes that fit you well. Do not wear high heels. °Keep your feet clean and dry. °General instructions °Take over-the-counter and prescription medicines only as told by your health care provider. °Rub your foot gently. °Pay attention to any changes in your symptoms. °Keep all follow-up visits as told by your health care provider. This is important. °Contact a health care provider if: °Your pain does not get better after a few days of self-care. °Your pain gets worse. °You cannot stand on your foot. °Get help right away if: °Your foot is numb or tingling. °Your foot or toes are swollen. °Your foot or toes turn white or blue. °You have warmth and redness along your foot. °Summary °Common causes of foot pain are injury, sprain, arthritis, blisters, or bunions. °Ice, medicines, and comfortable shoes may help foot pain. °Contact your health care provider if your pain does not get better after a few days of self-care. °This information is not intended to replace advice given to you by your health care provider. Make sure you discuss any questions you  have with your health care provider. °Document Revised: 07/26/2020 Document Reviewed: 07/26/2020 °Elsevier Patient Education © 2022 [employer] ° °

## 2021-06-05 NOTE — Progress Notes (Signed)
I,Erika Frey,acting as a Neurosurgeon for Erika Felts, FNP.,have documented all relevant documentation on the behalf of Erika Felts, FNP,as directed by  Erika Felts, FNP while in the presence of Erika Felts, FNP.  This visit occurred during the SARS-CoV-2 public health emergency.  Safety protocols were in place, including screening questions prior to the visit, additional usage of staff PPE, and extensive cleaning of exam room while observing appropriate contact time as indicated for disinfecting solutions.  Subjective:     Patient ID: Erika Frey , female    DOB: Sep 18, 1995 , 26 y.o.   MRN: [id number]   Chief Complaint  Patient presents with   Foot Pain    HPI  Pt presents today for foot pain. She reports this has been going on since last year. 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 She will have severe pain to her right foot more than left. When standing too long she will have swelling.  She will take ibuprofen and tylenol. She is having sharp pain shooting to the bones of her feet. When resting and not cold and raining. 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 She has a prescription to get insoles. She is currently on disability for her foot issue.   She did go to a foot doctor located at [facility], pt reports specialist did not satisfy the "need" figuring out what could be causing pain.  She reports pain is a 10/10. Pain is worse in the morning to the point where she is crying.   She also reports during the winter, cold & rainy her feet tend to hurt more.   Pt currently has kenalog & muscle relaxer medication on file.  She currently has a membership to the gym but has difficulty with exercising due to pain. Helps for a few minutes but then has pain.   LMP - 05/26/2021 - she is on OCP.   Her mother is present during the visit.  Foot Pain This is a chronic problem. The current episode started more than 1 year ago. The problem has  been gradually worsening. Pertinent negatives include no abdominal pain, congestion, fever, numbness or weakness.    Past Medical History:  Diagnosis Date   Absent menses    MOTHER STATES PT STARTED PERIOD AT AGE 1 BUT LAST PERIOD WAS 2 YRS AGO , AGE 1   Asthma    Eczema    Exotropia of left eye    Immunizations up to date    Simple constipation    OCCASIONAL   Sinus headache      Family History  Problem Relation Age of Onset   Hypertension Mother    Diabetes Mother    Heart disease Mother    Hypertension Maternal Grandmother    Heart disease Maternal Grandmother      Current Outpatient Medications:    albuterol (PROVENTIL HFA;VENTOLIN HFA) 108 (90 BASE) MCG/ACT inhaler, Inhale 2 puffs into the lungs every 6 (six) hours as needed., Disp: , Rfl:    cetirizine (ZYRTEC) 10 MG tablet, Take 1 tablet by mouth daily., Disp: , Rfl:    clobetasol cream (TEMOVATE) 0.05 %, Apply 1 application topically 2 (two) times daily., Disp: 30 g, Rfl: 3   Fluocinolone Acetonide 0.01 % OIL, Instill 5 drops twice daily into both ear(s) for 14 days. Then stop and use as needed for itching., Disp: , Rfl:    fluticasone (FLONASE) 50 MCG/ACT nasal spray,  SHAKE LIQUID AND USE 2 SPRAYS IN EACH NOSTRIL DAILY, Disp: , Rfl:    ibuprofen (ADVIL) 600 MG tablet, Take 1 tablet (600 mg total) by mouth every 6 (six) hours as needed., Disp: 30 tablet, Rfl: 0   levocetirizine (XYZAL) 5 MG tablet, Take 5 mg by mouth daily., Disp: , Rfl:    montelukast (SINGULAIR) 10 MG tablet, Take 10 mg by mouth at bedtime., Disp: , Rfl:    montelukast (SINGULAIR) 10 MG tablet, Take 1 tablet by mouth daily., Disp: , Rfl:    Norgestimate-Ethinyl Estradiol Triphasic (TRI-LO-SPRINTEC) 0.18/0.215/0.25 MG-25 MCG tab, Take 1 tablet by mouth daily., Disp: 74 tablet, Rfl: 3   Olopatadine HCl 0.6 % SOLN, Place 2 sprays into both nostrils 2 (two) times daily., Disp: , Rfl:    phentermine 15 MG capsule, Take 1 capsule (15 mg total) by mouth  every morning., Disp: 30 capsule, Rfl: 1   pseudoephedrine (SUDAFED) 60 MG tablet, Take 1 tablet (60 mg total) by mouth every 8 (eight) hours as needed for congestion., Disp: 15 tablet, Rfl: 0   tacrolimus (PROTOPIC) 0.1 % ointment, Apply topically 2 (two) times daily. Large body surface area, Disp: 60 g, Rfl: 0   Vitamin D, Ergocalciferol, (DRISDOL) 1.25 MG (50000 UNIT) CAPS capsule, Take 1 capsule (50,000 Units total) by mouth every 7 (seven) days., Disp: 12 capsule, Rfl: 0   benzonatate (TESSALON PERLES) 100 MG capsule, Take 1 capsule (100 mg total) by mouth every 6 (six) hours as needed. (Patient not taking: No sig reported), Disp: 30 capsule, Rfl: 1   Dupilumab (DUPIXENT [place]), Inject into the skin. (Patient not taking: Reported on 06/05/2021), Disp: , Rfl:    PATADAY 0.2 % SOLN, , Disp: , Rfl: 0   tiZANidine (ZANAFLEX) 4 MG tablet, Take 1 tablet (4 mg total) by mouth every 6 (six) hours as needed for muscle spasms. (Patient not taking: Reported on 06/05/2021), Disp: 30 tablet, Rfl: 0  Current Facility-Administered Medications:    ketorolac (TORADOL) injection 60 mg, 60 mg, Intramuscular, Once, Erika Felts, FNP   triamcinolone acetonide (KENALOG-40) injection 40 mg, 40 mg, Intramuscular, Once, Erika Felts, FNP   Allergies  Allergen Reactions   Penicillins Other (See Comments)    Stomach cramps     Review of Systems  Constitutional: Negative.  Negative for fever.  HENT:  Negative for congestion.   Respiratory: Negative.    Cardiovascular: Negative.   Gastrointestinal:  Negative for abdominal pain.  Neurological:  Negative for weakness and numbness.  Psychiatric/Behavioral: Negative.      Today's Vitals   06/05/21 0839  BP: 112/78  Pulse: 89  Temp: 98.1 F (36.7 C)  Weight: (!) 362 lb 6.4 oz (164.4 kg)  Height: 5\' 6"  (1.676 m)   Body mass index is 58.49 kg/m.  Wt Readings from Last 3 Encounters:  06/05/21 (!) 362 lb 6.4 oz (164.4 kg)  01/25/21 (!) 329 lb (149.2 kg)   11/02/20 (!) 349 lb 3.2 oz (158.4 kg)    Objective:  Physical Exam Vitals reviewed.  Constitutional:      General: She is not in acute distress.    Appearance: Normal appearance.  Cardiovascular:     Rate and Rhythm: Normal rate and regular rhythm.     Pulses: Normal pulses.     Heart sounds: Normal heart sounds. No murmur heard. Pulmonary:     Effort: Pulmonary effort is normal. No respiratory distress.     Breath sounds: Normal breath sounds. No wheezing.  Musculoskeletal:  General: Tenderness (right posterior ankle and medial foot, pain with flexion) present.  Skin:    Capillary Refill: Capillary refill takes less than 2 seconds.  Neurological:     General: No focal deficit present.     Mental Status: She is alert and oriented to person, place, and time.     Cranial Nerves: No cranial nerve deficit.  Psychiatric:        Mood and Affect: Mood normal.        Behavior: Behavior normal.        Thought Content: Thought content normal.        Judgment: Judgment normal.        Assessment And Plan:     1. Foot pain, bilateral Comments: Resultant of several foot surgeries, will treat with Kenalog and Toradol, will also prescribe pain cream.  - Ambulatory referral to Podiatry - ketorolac (TORADOL) injection 60 mg - triamcinolone acetonide (KENALOG-40) injection 40 mg  2. Other eczema Comments: Will change triamncinolone to clobetasol. she has been to Dermatology in the past and treated with Dupixent which had side effects for her - clobetasol cream (TEMOVATE) 0.05 %; Apply 1 application topically 2 (two) times daily.  Dispense: 30 g; Refill: 3  3. Class 3 severe obesity due to excess calories without serious comorbidity with body mass index (BMI) of 50.0 to 59.9 in adult [facility](HCC) Comments: Will start her on phentermine 15 mg, discussed side effects to include palpitations. She is encouraged to strive for BMI less than 30 to decrease cardiac risk. Advised to aim for at least  150 minutes of exercise per week.  - Insulin, random - TSH - phentermine 15 MG capsule; Take 1 capsule (15 mg total) by mouth every morning.  Dispense: 30 capsule; Refill: 1    Patient was given opportunity to ask questions. Patient verbalized understanding of the plan and was able to repeat key elements of the plan. All questions were answered to their satisfaction.  Erika FeltsJanece Keyonni Percival, FNP   I, Erika FeltsJanece Chantelle Verdi, FNP, have reviewed all documentation for this visit. The documentation on 06/05/21 for the exam, diagnosis, procedures, and orders are all accurate and complete.   IF YOU HAVE BEEN REFERRED TO A SPECIALIST, IT MAY TAKE 1-2 WEEKS TO SCHEDULE/PROCESS THE REFERRAL. IF YOU HAVE NOT HEARD FROM US/SPECIALIST IN TWO WEEKS, PLEASE GIVE US A CALL AT [phone].   THE PATIENT IS ENCOURAGED TO PRACTICE SOCIAL DISTANCING DUE TO THE COVID-19 PANDEMIC.

## 2021-06-06 LAB — INSULIN, RANDOM: INSULIN: 43.1 u[IU]/mL — ABNORMAL HIGH (ref 2.6–24.9)

## 2021-06-06 LAB — TSH: TSH: 2.08 u[IU]/mL (ref 0.450–4.500)

## 2021-06-12 ENCOUNTER — Other Ambulatory Visit: Payer: Self-pay

## 2021-06-12 DIAGNOSIS — L2089 Other atopic dermatitis: Secondary | ICD-10-CM

## 2021-06-21 ENCOUNTER — Ambulatory Visit: Payer: Medicare Other | Admitting: Sports Medicine

## 2021-07-14 ENCOUNTER — Encounter (HOSPITAL_COMMUNITY): Payer: Self-pay

## 2021-07-14 ENCOUNTER — Ambulatory Visit (HOSPITAL_COMMUNITY)
Admission: EM | Admit: 2021-07-14 | Discharge: 2021-07-14 | Disposition: A | Discharge status: Home / Self Care | Payer: Medicare Other | Attending: Nurse Practitioner | Admitting: Nurse Practitioner

## 2021-07-14 ENCOUNTER — Other Ambulatory Visit: Payer: Self-pay

## 2021-07-14 DIAGNOSIS — R002 Palpitations: Secondary | ICD-10-CM | POA: Insufficient documentation

## 2021-07-14 DIAGNOSIS — F419 Anxiety disorder, unspecified: Secondary | ICD-10-CM | POA: Diagnosis present

## 2021-07-14 LAB — CBC
HCT: 36.6 % (ref 36.0–46.0)
Hemoglobin: 12.1 g/dL (ref 12.0–15.0)
MCH: 23.8 pg — ABNORMAL LOW (ref 26.0–34.0)
MCHC: 33.1 g/dL (ref 30.0–36.0)
MCV: 71.9 fL — ABNORMAL LOW (ref 80.0–100.0)
Platelets: 370 10*3/uL (ref 150–400)
RBC: 5.09 MIL/uL (ref 3.87–5.11)
RDW: 16.1 % — ABNORMAL HIGH (ref 11.5–15.5)
WBC: 16.7 10*3/uL — ABNORMAL HIGH (ref 4.0–10.5)
nRBC: 0 % (ref 0.0–0.2)

## 2021-07-14 LAB — COMPREHENSIVE METABOLIC PANEL
ALT: 21 U/L (ref 0–44)
AST: 18 U/L (ref 15–41)
Albumin: 3.2 g/dL — ABNORMAL LOW (ref 3.5–5.0)
Alkaline Phosphatase: 44 U/L (ref 38–126)
Anion gap: 9 (ref 5–15)
BUN: 9 mg/dL (ref 6–20)
CO2: 25 mmol/L (ref 22–32)
Calcium: 9.4 mg/dL (ref 8.9–10.3)
Chloride: 102 mmol/L (ref 98–111)
Creatinine, Ser: 0.82 mg/dL (ref 0.44–1.00)
GFR, Estimated: >60 mL/min (ref >60)
Glucose, Bld: 108 mg/dL — ABNORMAL HIGH (ref 70–99)
Potassium: 3.8 mmol/L (ref 3.5–5.1)
Sodium: 136 mmol/L (ref 135–145)
Total Bilirubin: 0.5 mg/dL (ref 0.3–1.2)
Total Protein: 7.3 g/dL (ref 6.5–8.1)

## 2021-07-14 LAB — TSH: TSH: 1.499 u[IU]/mL (ref 0.350–4.500)

## 2021-07-14 MED ORDER — HYDROXYZINE HCL 25 MG PO TABS: 25.0000 mg | ORAL_TABLET | Freq: Three times a day (TID) | ORAL | 0 refills | Status: DC | PRN

## 2021-07-14 NOTE — ED Provider Notes (Signed)
[facility]    CSN: [id number] Arrival date & time: 07/14/21  1254      History   Chief Complaint Chief Complaint  Patient presents with   Irregular Heart Beat    HPI Erika Frey is a 26 y.o. female.   Patient reports feeling like her heart racing increasing in frequency for the past month.  She reports last night when she was laying down, she felt it in her throat and this made her nervous.  She denies any chest pain, shortness of breath, double vision, or dizziness with the palpitations.    She reports a history of chronic anxiety and depression and has never taken anything for this.  She also reports she does crave ice and use to have irregular and heavy periods, however she does not have heavy periods any longer.  She drinks energy drinks daily, also drinks 1-2 cups of coffee per day.     Past Medical History:  Diagnosis Date   Absent menses    MOTHER STATES PT STARTED PERIOD AT AGE 26 BUT LAST PERIOD WAS 2 YRS AGO , AGE 45   Asthma    Eczema    Exotropia of left eye    Immunizations up to date    Simple constipation    OCCASIONAL   Sinus headache     Patient Active Problem List   Diagnosis Date Noted   Exotropia of left eye 12/15/2012    Past Surgical History:  Procedure Laterality Date   FOOT SURGERY  AGE 89   MEDIAN RECTUS REPAIR Left 12/16/2012   Procedure: MEDIAN RECTUS RESECTION LEFT EYE;  Surgeon: Corinda GublerMichael A Spencer, MD;  [facility];  Service: Ophthalmology;  Laterality: Left;   MUSCLE RECESSION AND RESECTION Left 12/16/2012   Procedure: LATERAL RECTUS RECESSION LEFT EYE;  Surgeon: Corinda GublerMichael A Spencer, MD;  [facility] [place];  Service: Ophthalmology;  Laterality: Left;   TONSILLECTOMY AND ADENOIDECTOMY  AGE 40    OB History   No obstetric history on file.      Home Medications    Prior to Admission medications   Medication Sig Start Date End Date Taking? Authorizing Provider  hydrOXYzine  (ATARAX) 25 MG tablet Take 1 tablet (25 mg total) by mouth every 8 (eight) hours as needed for anxiety. Do not take while driving or operating heavy machinery - medication may make you sleepy 07/14/21  Yes Cathlean MarseillesMartinez, Chase Arnall A, NP  albuterol (PROVENTIL HFA;VENTOLIN HFA) 108 (90 BASE) MCG/ACT inhaler Inhale 2 puffs into the lungs every 6 (six) hours as needed.    [provider]  benzonatate (TESSALON PERLES) 100 MG capsule Take 1 capsule (100 mg total) by mouth every 6 (six) hours as needed. Patient not taking: No sig reported 12/18/20 12/18/21  Arnette FeltsMoore, Janece, FNP  cetirizine (ZYRTEC) 10 MG tablet Take 1 tablet by mouth daily. 05/30/20   [provider]  clobetasol cream (TEMOVATE) 0.05 % Apply 1 application topically 2 (two) times daily. 06/05/21   Arnette FeltsMoore, Janece, FNP  Dupilumab (DUPIXENT ) Inject into the skin. Patient not taking: Reported on 06/05/2021    [provider]  Fluocinolone Acetonide 0.01 % OIL Instill 5 drops twice daily into both ear(s) for 14 days. Then stop and use as needed for itching. 07/13/20   [provider]  fluticasone (FLONASE) 50 MCG/ACT nasal spray SHAKE LIQUID AND USE 2 SPRAYS IN EACH NOSTRIL DAILY 12/14/20   [provider]  ibuprofen (ADVIL) 600 MG tablet  Take 1 tablet (600 mg total) by mouth every 6 (six) hours as needed. 02/26/21   Ivette Loyal, NP  levocetirizine (XYZAL) 5 MG tablet Take 5 mg by mouth daily. 12/22/20   [provider]  montelukast (SINGULAIR) 10 MG tablet Take 10 mg by mouth at bedtime.    [provider]  montelukast (SINGULAIR) 10 MG tablet Take 1 tablet by mouth daily. 05/30/20   [provider]  Norgestimate-Ethinyl Estradiol Triphasic (TRI-LO-SPRINTEC) 0.18/0.215/0.25 MG-25 MCG tab Take 1 tablet by mouth daily. 09/12/20   [name], FNP  Olopatadine HCl 0.6 % SOLN Place 2 sprays into both nostrils 2 (two) times daily. 09/21/20   [provider]  PATADAY 0.2 % SOLN   10/12/14   [provider]  phentermine 15 MG capsule Take 1 capsule (15 mg total) by mouth every morning. 06/05/21   [name], FNP  pseudoephedrine (SUDAFED) 60 MG tablet Take 1 tablet (60 mg total) by mouth every 8 (eight) hours as needed for congestion. 01/10/14   Roxy Horseman, PA-C  tacrolimus (PROTOPIC) 0.1 % ointment Apply topically 2 (two) times daily. Large body surface area 01/11/21   Sheffield, Harvin Hazel R, PA-C  tiZANidine (ZANAFLEX) 4 MG tablet Take 1 tablet (4 mg total) by mouth every 6 (six) hours as needed for muscle spasms. Patient not taking: Reported on 06/05/2021 02/26/21   Ivette Loyal, NP  Vitamin D, Ergocalciferol, (DRISDOL) 1.25 MG (50000 UNIT) CAPS capsule Take 1 capsule (50,000 Units total) by mouth every 7 (seven) days. 12/18/20   Charlesetta Ivory, NP    Family History Family History  Problem Relation Age of Onset   Hypertension Mother    Diabetes Mother    Heart disease Mother    Hypertension Maternal Grandmother    Heart disease Maternal Grandmother     Social History Social History   Tobacco Use   Smoking status: Never   Smokeless tobacco: Never   Tobacco comments:    NO SMOKER IN HOME  Vaping Use   Vaping Use: Never used  Substance Use Topics   Alcohol use: No    Alcohol/week: 0.0 standard drinks   Drug use: No     Allergies   Penicillin g and Penicillins   Review of Systems Review of Systems Per HPI  Physical Exam Triage Vital Signs ED Triage Vitals  Enc Vitals Group     BP 07/14/21 1311 105/74     Pulse Rate 07/14/21 1311 97     Resp 07/14/21 1311 18     Temp 07/14/21 1311 97.9 F (36.6 C)     Temp Source 07/14/21 1311 Oral     SpO2 07/14/21 1311 97 %     Weight --      Height --      Head Circumference --      Peak Flow --      Pain Score 07/14/21 1312 0     Pain Loc --      Pain Edu? --      Excl. in GC? --    No data found.  Updated Vital Signs BP 105/74 (BP Location: Left Arm)    Pulse 97    Temp 97.9 F  (36.6 C) (Oral)    Resp 18    LMP 07/07/2021    SpO2 97%   Visual Acuity Right Eye Distance:   Left Eye Distance:   Bilateral Distance:    Right Eye Near:   Left Eye Near:  Bilateral Near:     Physical Exam Vitals and nursing note reviewed.  Constitutional:      General: She is not in acute distress.    Appearance: Normal appearance. She is not toxic-appearing.  Cardiovascular:     Rate and Rhythm: Normal rate and regular rhythm.     Heart sounds: Normal heart sounds. No murmur heard. Pulmonary:     Effort: Pulmonary effort is normal. No respiratory distress.     Breath sounds: Normal breath sounds. No wheezing, rhonchi or rales.  Musculoskeletal:     Cervical back: Normal range of motion and neck supple.     Right lower leg: No edema.     Left lower leg: No edema.  Lymphadenopathy:     Cervical: No cervical adenopathy.  Skin:    General: Skin is warm and dry.     Capillary Refill: Capillary refill takes less than 2 seconds.     Coloration: Skin is not jaundiced or pale.     Findings: No erythema.  Neurological:     Mental Status: She is alert and oriented to person, place, and time.     Motor: No weakness.     Gait: Gait normal.  Psychiatric:        Mood and Affect: Mood normal.        Behavior: Behavior normal.        Thought Content: Thought content normal.        Judgment: Judgment normal.     UC Treatments / Results  Labs (all labs ordered are listed, but only abnormal results are displayed) Labs Reviewed  CBC  TSH  COMPREHENSIVE METABOLIC PANEL    EKG   Radiology No results found.  Procedures Procedures (including critical care time)  Medications Ordered in UC Medications - No data to display  Initial Impression / Assessment and Plan / UC Course  I have reviewed the triage vital signs and the nursing notes.  Pertinent labs & imaging results that were available during my care of the patient were reviewed by me and considered in my medical  decision making (see chart for details).    EKG today shows normal sinus rhythm.  Will check CBC to evaluate for anemia, CMET to evaluate for electrolyte disturbance given energy drink consumption, and TSH to screen for thyroid disorder.  For intermittent anxiety, she can use hydroxyzine 25 mg up to three times daily as needed.  Plan to follow up with PCP within the next month if symptoms do not improve and to discuss long term management for anxiety.  Final Clinical Impressions(s) / UC Diagnoses   Final diagnoses:  Palpitations  Anxiety     Discharge Instructions      You EKG today is normal.  We will contact you if any of the blood work comes back abnormal.  As we discussed, please try to limit your intake of caffeine and energy drinks and try to stay plenty hydrated with water.  You can use hydroxyzine 25 mg up to 4 times daily as needed for anxiety.  The mediation may make you sleepy so take it at night time at first.  Please follow up with your primary care provider if your symptoms do not improve with the medication or if they worsen.     ED Prescriptions     Medication Sig Dispense Auth. Provider   hydrOXYzine (ATARAX) 25 MG tablet Take 1 tablet (25 mg total) by mouth every 8 (eight) hours as needed for anxiety. Do  not take while driving or operating heavy machinery - medication may make you sleepy 30 tablet Valentino Nose, NP      PDMP not reviewed this encounter.   Valentino Nose, NP 07/14/21 [phone]

## 2021-07-14 NOTE — ED Triage Notes (Signed)
Pt presents with irregular heart beat x 1-2 months. She reports her heart was beating so fast last night it woke her up out her sleep.  ?She was taking phentermine but stop about 1-2 weeks ago due to side effects.  ?

## 2021-07-14 NOTE — Discharge Instructions (Addendum)
You EKG today is normal.  We will contact you if any of the blood work comes back abnormal.  As we discussed, please try to limit your intake of caffeine and energy drinks and try to stay plenty hydrated with water.  You can use hydroxyzine 25 mg up to 4 times daily as needed for anxiety.  The mediation may make you sleepy so take it at night time at first.  Please follow up with your primary care provider if your symptoms do not improve with the medication or if they worsen. ?

## 2021-07-14 NOTE — ED Triage Notes (Signed)
She does report having trouble with sleep at night and thinks she may have sleep apnea. ?

## 2021-07-18 ENCOUNTER — Ambulatory Visit (INDEPENDENT_AMBULATORY_CARE_PROVIDER_SITE_OTHER): Payer: Medicare Other | Admitting: Nurse Practitioner

## 2021-07-18 ENCOUNTER — Other Ambulatory Visit: Payer: Self-pay

## 2021-07-18 ENCOUNTER — Encounter: Payer: Self-pay | Admitting: Nurse Practitioner

## 2021-07-18 VITALS — BP 110/68 | HR 78 | Temp 98.1°F | Wt 354.6 lb

## 2021-07-18 DIAGNOSIS — R0683 Snoring: Secondary | ICD-10-CM

## 2021-07-18 DIAGNOSIS — G8929 Other chronic pain: Secondary | ICD-10-CM

## 2021-07-18 DIAGNOSIS — R5383 Other fatigue: Secondary | ICD-10-CM

## 2021-07-18 DIAGNOSIS — F419 Anxiety disorder, unspecified: Secondary | ICD-10-CM

## 2021-07-18 DIAGNOSIS — I499 Cardiac arrhythmia, unspecified: Secondary | ICD-10-CM | POA: Diagnosis not present

## 2021-07-18 DIAGNOSIS — M25512 Pain in left shoulder: Secondary | ICD-10-CM | POA: Diagnosis not present

## 2021-07-18 DIAGNOSIS — M25511 Pain in right shoulder: Secondary | ICD-10-CM

## 2021-07-18 DIAGNOSIS — Z6841 Body Mass Index (BMI) 40.0 and over, adult: Secondary | ICD-10-CM

## 2021-07-18 DIAGNOSIS — D649 Anemia, unspecified: Secondary | ICD-10-CM | POA: Diagnosis not present

## 2021-07-18 MED ORDER — MAGNESIUM 250 MG PO TABS: 1.0000 | ORAL_TABLET | Freq: Every day | ORAL | 2 refills | Status: DC

## 2021-07-18 MED ORDER — ASHWAGANDHA 125 MG PO CAPS: 1.0000 | ORAL_CAPSULE | Freq: Every evening | ORAL | 3 refills | Status: DC | PRN

## 2021-07-18 NOTE — Patient Instructions (Signed)

## 2021-07-18 NOTE — Progress Notes (Signed)
I,[name],acting as a Neurosurgeon for [name], FNP.,have documented all relevant documentation on the behalf of [name], FNP,as directed by  [name], FNP while in the presence of [name], FNP.  This visit occurred during the SARS-CoV-2 public health emergency.  Safety protocols were in place, including screening questions prior to the visit, additional usage of staff PPE, and extensive cleaning of exam room while observing appropriate contact time as indicated for disinfecting solutions.  Subjective:     Patient ID: Erika Frey , female    DOB: Feb 15, 1996 , 26 y.o.   MRN: [id number]   Chief Complaint  Patient presents with   Follow-up    HPI  Pt presents for ED f/u. She visited on 07/14/2021, for irregular heart beat.  She reports feeling " thumps in neck". Every 2 to 3 minutes. She had been having the symptoms for the last 2 months.  Pt was given hydroxyzine 25mg  to take 4 time daily. She only takes twice a day since being seen at ED. After being prescribed the medication she does feel more anxious & jittery.  EKG performed during ED visit.   She feels this was related to anxiety due to not liking to be around big crowds. The symptoms have awakened  She is not taking phentermine due to feeling like her heart was racing.  She had been seeing a counselor since 26 y/o but she retired 2-3 years ago.  Reports her mother has a history of CAD and has a stent for about 10 years.   She is walking outside more and walking the dog. She is going to the gym [place] to exercise.  She is drinking water and she was drinking     Past Medical History:  Diagnosis Date   Absent menses    MOTHER STATES PT STARTED PERIOD AT AGE 34 BUT LAST PERIOD WAS 2 YRS AGO , AGE 24   Asthma    Eczema    Exotropia of left eye    Immunizations up to date    Simple constipation    OCCASIONAL   Sinus headache      Family History  Problem Relation Age of Onset   Hypertension Mother     Diabetes Mother    Heart disease Mother    Hypertension Maternal Grandmother    Heart disease Maternal Grandmother      Current Outpatient Medications:    albuterol (PROVENTIL HFA;VENTOLIN HFA) 108 (90 BASE) MCG/ACT inhaler, Inhale 2 puffs into the lungs every 6 (six) hours as needed., Disp: , Rfl:    Ashwagandha 125 MG CAPS, Take 1 tablet by mouth at bedtime as needed., Disp: 30 capsule, Rfl: 3   cetirizine (ZYRTEC) 10 MG tablet, Take 1 tablet by mouth daily., Disp: , Rfl:    clobetasol cream (TEMOVATE) 0.05 %, Apply 1 application topically 2 (two) times daily., Disp: 30 g, Rfl: 3   Fluocinolone Acetonide 0.01 % OIL, Instill 5 drops twice daily into both ear(s) for 14 days. Then stop and use as needed for itching., Disp: , Rfl:    fluticasone (FLONASE) 50 MCG/ACT nasal spray, SHAKE LIQUID AND USE 2 SPRAYS IN EACH NOSTRIL DAILY, Disp: , Rfl:    ibuprofen (ADVIL) 600 MG tablet, Take 1 tablet (600 mg total) by mouth every 6 (six) hours as needed., Disp: 30 tablet, Rfl: 0   levocetirizine (XYZAL) 5 MG tablet, Take 5 mg by mouth daily., Disp: , Rfl:    Magnesium 250 MG TABS, Take  1 tablet (250 mg total) by mouth daily., Disp: 30 tablet, Rfl: 2   montelukast (SINGULAIR) 10 MG tablet, Take 10 mg by mouth at bedtime., Disp: , Rfl:    montelukast (SINGULAIR) 10 MG tablet, Take 1 tablet by mouth daily., Disp: , Rfl:    Norgestimate-Ethinyl Estradiol Triphasic (TRI-LO-SPRINTEC) 0.18/0.215/0.25 MG-25 MCG tab, Take 1 tablet by mouth daily., Disp: 74 tablet, Rfl: 3   Olopatadine HCl 0.6 % SOLN, Place 2 sprays into both nostrils 2 (two) times daily., Disp: , Rfl:    PATADAY 0.2 % SOLN, , Disp: , Rfl: 0   pseudoephedrine (SUDAFED) 60 MG tablet, Take 1 tablet (60 mg total) by mouth every 8 (eight) hours as needed for congestion., Disp: 15 tablet, Rfl: 0   tacrolimus (PROTOPIC) 0.1 % ointment, Apply topically 2 (two) times daily. Large body surface area, Disp: 60 g, Rfl: 0   Vitamin D, Ergocalciferol,  (DRISDOL) 1.25 MG (50000 UNIT) CAPS capsule, Take 1 capsule (50,000 Units total) by mouth every 7 (seven) days., Disp: 12 capsule, Rfl: 0   benzonatate (TESSALON PERLES) 100 MG capsule, Take 1 capsule (100 mg total) by mouth every 6 (six) hours as needed. (Patient not taking: Reported on 01/11/2021), Disp: 30 capsule, Rfl: 1   Dupilumab (DUPIXENT [place]), Inject into the skin. (Patient not taking: Reported on 06/05/2021), Disp: , Rfl:    ferrous sulfate 325 (65 FE) MG EC tablet, Take 1 tablet (325 mg total) by mouth 2 (two) times daily., Disp: 60 tablet, Rfl: 3   tiZANidine (ZANAFLEX) 4 MG tablet, Take 1 tablet (4 mg total) by mouth every 6 (six) hours as needed for muscle spasms. (Patient not taking: Reported on 06/05/2021), Disp: 30 tablet, Rfl: 0   Allergies  Allergen Reactions   Penicillin G     Other reaction(s): Unknown   Penicillins Other (See Comments)    Stomach cramps     Review of Systems  Constitutional: Negative.   Respiratory: Negative.    Cardiovascular: Negative.  Negative for chest pain, palpitations and leg swelling.  Musculoskeletal:        Bilateral shoulder pain   Neurological: Negative.   Psychiatric/Behavioral: Negative.      Today's Vitals   07/18/21 1419  BP: 110/68  Pulse: 78  Temp: 98.1 F (36.7 C)  Weight: (!) 354 lb 9.6 oz (160.8 kg)   Body mass index is 57.23 kg/m.  Wt Readings from Last 3 Encounters:  07/18/21 (!) 354 lb 9.6 oz (160.8 kg)  06/05/21 (!) 362 lb 6.4 oz (164.4 kg)  01/25/21 (!) 329 lb (149.2 kg)    Objective:  Physical Exam Vitals reviewed.  Constitutional:      Appearance: Normal appearance. She is well-developed.  HENT:     Head: Normocephalic and atraumatic.  Eyes:     Pupils: Pupils are equal, round, and reactive to light.  Cardiovascular:     Rate and Rhythm: Normal rate and regular rhythm.     Pulses: Normal pulses.     Heart sounds: Normal heart sounds. No murmur heard. Pulmonary:     Effort: Pulmonary effort is normal. No  respiratory distress.     Breath sounds: Normal breath sounds. No wheezing.  Musculoskeletal:        General: Normal range of motion.  Skin:    General: Skin is warm and dry.     Capillary Refill: Capillary refill takes less than 2 seconds.  Neurological:     General: No focal deficit present.  Mental Status: She is alert and oriented to person, place, and time.     Cranial Nerves: No cranial nerve deficit.     Motor: No weakness.  Psychiatric:        Mood and Affect: Mood normal.        Behavior: Behavior normal.        Thought Content: Thought content normal.        Judgment: Judgment normal.        Assessment And Plan:     1. Irregular heart beat Comments: Will refer for sleep study and evaluation by cardiology, she has a family history of heart disease - Ambulatory referral to Sleep Studies - Magnesium 250 MG TABS; Take 1 tablet (250 mg total) by mouth daily.  Dispense: 30 tablet; Refill: 2 - Ashwagandha 125 MG CAPS; Take 1 tablet by mouth at bedtime as needed.  Dispense: 30 capsule; Refill: 3 - Ambulatory referral to Cardiology  2. Other fatigue Comments: Labs have been normal, due to history of snoring and obesity will refer for sleep study evaluation - Iron, TIBC and Ferritin Panel - Vitamin B12  3. Anxiety Comments: Encouraged to take magnesium to help.   - Magnesium 250 MG TABS; Take 1 tablet (250 mg total) by mouth daily.  Dispense: 30 tablet; Refill: 2 - Ashwagandha 125 MG CAPS; Take 1 tablet by mouth at bedtime as needed.  Dispense: 30 capsule; Refill: 3  4. Snoring - Ambulatory referral to Sleep Studies  5. Chronic pain of both shoulders Comments: Having pain to her shoulders and is concerned about breast being enlarged causing her shoulder pain - Ambulatory referral to Plastic Surgery  6. Class 3 severe obesity due to excess calories without serious comorbidity with body mass index (BMI) of 50.0 to 59.9 in adult [facility]) She is encouraged to strive for BMI  less than 30 to decrease cardiac risk. Advised to aim for at least 150 minutes of exercise per week.    Patient was given opportunity to ask questions. Patient verbalized understanding of the plan and was able to repeat key elements of the plan. All questions were answered to their satisfaction.  [name], FNP   I, [name], FNP, have reviewed all documentation for this visit. The documentation on 07/18/21 for the exam, diagnosis, procedures, and orders are all accurate and complete.   IF YOU HAVE BEEN REFERRED TO A SPECIALIST, IT MAY TAKE 1-2 WEEKS TO SCHEDULE/PROCESS THE REFERRAL. IF YOU HAVE NOT HEARD FROM US/SPECIALIST IN TWO WEEKS, PLEASE GIVE [place] A CALL AT [phone].   THE PATIENT IS ENCOURAGED TO PRACTICE SOCIAL DISTANCING DUE TO THE COVID-19 PANDEMIC.

## 2021-07-19 LAB — IRON,TIBC AND FERRITIN PANEL
Ferritin: 76 ng/mL (ref 15–150)
Iron Saturation: 7 % — CL (ref 15–55)
Iron: 25 ug/dL — ABNORMAL LOW (ref 27–159)
Total Iron Binding Capacity: 363 ug/dL (ref 250–450)
UIBC: 338 ug/dL (ref 131–425)

## 2021-07-19 LAB — VITAMIN B12: Vitamin B-12: 659 pg/mL (ref 232–1245)

## 2021-07-20 ENCOUNTER — Other Ambulatory Visit: Payer: Self-pay | Admitting: Nurse Practitioner

## 2021-07-20 DIAGNOSIS — D508 Other iron deficiency anemias: Secondary | ICD-10-CM

## 2021-07-20 MED ORDER — FERROUS SULFATE 325 (65 FE) MG PO TBEC: 325.0000 mg | DELAYED_RELEASE_TABLET | Freq: Two times a day (BID) | ORAL | 3 refills | Status: DC

## 2021-08-06 ENCOUNTER — Ambulatory Visit: Payer: Medicare Other | Admitting: Nurse Practitioner

## 2021-08-08 ENCOUNTER — Other Ambulatory Visit: Payer: Self-pay | Admitting: Nurse Practitioner

## 2021-08-10 NOTE — Progress Notes (Signed)
?Cardiology Office Note:   ? ?Date:  08/16/2021  ? ?ID:  Angola Elko, DOB [birth year], MRN [id number] ? ?PCP:  Minette Brine, FNP  ? ?[facility] Providers ?Cardiologist:  Lenna Sciara, MD ?Referring MD: Minette Brine, FNP  ? ?Chief Complaint/Reason for Referral: Palpitations ? ?ASSESSMENT:   ? ?1. Palpitations   ?2. BMI 50.0-59.9, adult ([place])   ? ? ?PLAN:   ? ?In order of problems listed above: ?1.  Agree with sleep study.  Will obtain echocardiogram and monitor to evaluate further.  We will keep follow-up open-ended depending on the results of this testing. ?2.  Consider pharmacotherapy for elevated BMI.  I have asked her to continue moderate exercise as she had been prescribed before. ? ? ?Dispo:  Return if symptoms worsen or fail to improve.  ? ?  ? ?Medication Adjustments/Labs and Tests Ordered: ?Current medicines are reviewed at length with the patient today.  Concerns regarding medicines are outlined above. ? ?The following changes have been made:  no change  ? ?Labs/tests ordered: ?Orders Placed This Encounter  ?Procedures  ? LONG TERM MONITOR (3-14 DAYS)  ? EKG 12-Lead  ? ECHOCARDIOGRAM COMPLETE  ? ? ?Medication Changes: ?No orders of the defined types were placed in this encounter. ? ? ? ?Current medicines are reviewed at length with the patient today.  The patient does not have concerns regarding medicines. ? ? ?History of Present Illness:   ? ?FOCUSED PROBLEM LIST:   ?1.  Chronic anxiety and depression ?2.  BMI of greater than 50 ? ?The patient is a 26 y.o. female with the indicated medical history here for recommendations regarding palpitations.  The patient was seen in the emergency department last month with symptoms of palpitations.  She denied any chest pain, shortness of breath or dizziness.  Laboratories including a CMP, TSH, and CBC were unremarkable.  An EKG was performed which reportedly showed normal sinus rhythm.  This study is unavailable for review.  Seen in her primary care provider's  office to follow-up regarding this.  She feels that it was secondary to anxiety.  She was referred for a sleep study and to cardiology for further recommendations. ? ?The patient tells me that after her urgent care visit she stopped drinking caffeine and energy drinks and her palpitations got better.  She dissipates in praise dance at her church.  When she has these practices on Saturdays she will get palpitations.  She denies any chest pain, shortness of breath, peripheral edema, presyncope, or syncope.  She is thinking about breast reduction surgery in the future and wanted to make sure her heart was okay for this.  She is otherwise well without complaints.  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 ? ? ? ?Current Medications: ?Current Meds  ?Medication Sig  ? albuterol (PROVENTIL HFA;VENTOLIN HFA) 108 (90 BASE) MCG/ACT inhaler Inhale 2 puffs into the lungs every 6 (six) hours as needed.  ? Ashwagandha 125 MG CAPS Take 1 tablet by mouth at bedtime as needed.  ? cetirizine (ZYRTEC) 10 MG tablet Take 1 tablet by mouth daily.  ? clobetasol cream (TEMOVATE) [id number] % Apply 1 application topically 2 (two) times daily.  ? Dupilumab (DUPIXENT [place]) Inject into the skin.  ? ferrous sulfate 325 (65 FE) MG EC tablet Take 1 tablet (325 mg total) by mouth 2 (two) times daily.  ? fluticasone (FLONASE) 50 MCG/ACT nasal spray SHAKE LIQUID AND USE 2 SPRAYS IN EACH NOSTRIL DAILY  ? ibuprofen (ADVIL)  600 MG tablet Take 1 tablet (600 mg total) by mouth every 6 (six) hours as needed.  ? levocetirizine (XYZAL) 5 MG tablet Take 5 mg by mouth daily.  ? Magnesium 250 MG TABS Take 1 tablet (250 mg total) by mouth daily.  ? montelukast (SINGULAIR) 10 MG tablet Take 10 mg by mouth at bedtime.  ? montelukast (SINGULAIR) 10 MG tablet Take 1 tablet by mouth daily.  ? Norgestimate-Ethinyl Estradiol Triphasic (TRI-LO-SPRINTEC) 0.18/0.215/0.25 MG-25 MCG tab Take 1 tablet by mouth daily.  ? Olopatadine HCl 0.6 % SOLN Place 2 sprays into  both nostrils 2 (two) times daily.  ? PATADAY 0.2 % SOLN   ? pseudoephedrine (SUDAFED) 60 MG tablet Take 1 tablet (60 mg total) by mouth every 8 (eight) hours as needed for congestion.  ? tacrolimus (PROTOPIC) 0.1 % ointment Apply topically 2 (two) times daily. Large body surface area  ? tiZANidine (ZANAFLEX) 4 MG tablet Take 1 tablet (4 mg total) by mouth every 6 (six) hours as needed for muscle spasms.  ?  ? ?Allergies:    ?Penicillin g and Penicillins  ? ?Social History:   ?Social History  ? ?Tobacco Use  ? Smoking status: Never  ? Smokeless tobacco: Never  ? Tobacco comments:  ?  NO SMOKER IN HOME  ?Vaping Use  ? Vaping Use: Never used  ?Substance Use Topics  ? Alcohol use: No  ?  Alcohol/week: 0.0 standard drinks  ? Drug use: No  ?  ? ?Family Hx: ?Family History  ?Problem Relation Age of Onset  ? Hypertension Mother   ? Diabetes Mother   ? Heart disease Mother   ? Hypertension Maternal Grandmother   ? Heart disease Maternal Grandmother   ?  ? ?Review of Systems:   ?Please see the history of present illness.    ?All other systems reviewed and are negative. ?  ? ? ?EKGs/Labs/Other Test Reviewed:   ? ?EKG:  EKG performed today that I personally reviewed demonstrates sinus rhythm ? ?Prior CV studies: ?None available ?  ? ?Other studies Reviewed: ?Review of the additional studies/records demonstrates: None relevant ? ?Recent Labs: ?07/14/2021: ALT 21; BUN 9; Creatinine, Ser 0.82; Hemoglobin 12.1; Platelets 370; Potassium 3.8; Sodium 136; TSH 1.499  ? ?Recent Lipid Panel ?Lab Results  ?Component Value Date/Time  ? CHOL 175 11/02/2020 12:25 PM  ? TRIG 69 11/02/2020 12:25 PM  ? HDL 45 11/02/2020 12:25 PM  ? LDLCALC 117 (H) 11/02/2020 12:25 PM  ? ? ?Risk Assessment/Calculations:   ? ?  ?    ? ?Physical Exam:   ? ?VS:  BP 118/76   Pulse 76   Ht 5\' 6"  (1.676 m)   Wt (!) 356 lb 12.8 oz (161.8 kg)   SpO2 100%   BMI 57.59 kg/m?    ?Wt Readings from Last 3 Encounters:  ?08/16/21 (!) 356 lb 12.8 oz (161.8 kg)  ?07/18/21  (!) 354 lb 9.6 oz (160.8 kg)  ?06/05/21 (!) 362 lb 6.4 oz (164.4 kg)  ?  ?GENERAL:  No apparent distress, AOx3 ?HEENT:  No carotid bruits, +2 carotid impulses, no scleral icterus ?CAR: RRR no murmurs, gallops, rubs, or thrills ?RES:  Clear to auscultation bilaterally ?ABD:  Soft, nontender, nondistended, positive bowel sounds x 4 ?VASC:  +2 radial pulses, +2 carotid pulses, palpable pedal pulses ?NEURO:  CN 2-12 grossly intact; motor and sensory grossly intact ?PSYCH:  No active depression or anxiety ?EXT:  No edema, ecchymosis, or cyanosis ? ?Signed, ?Early Osmond,  MD  ?08/16/2021 9:56 AM    ?[place] ?[address], [place], [place]  [address] ?Phone: [phone]; Fax: [phone]  ? ?Note:  This document was prepared using Dragon voice recognition software and may include unintentional dictation errors. ?

## 2021-08-16 ENCOUNTER — Ambulatory Visit (INDEPENDENT_AMBULATORY_CARE_PROVIDER_SITE_OTHER): Payer: Medicare Other | Admitting: Internal Medicine

## 2021-08-16 ENCOUNTER — Ambulatory Visit (INDEPENDENT_AMBULATORY_CARE_PROVIDER_SITE_OTHER): Payer: Medicare Other

## 2021-08-16 ENCOUNTER — Encounter: Payer: Self-pay | Admitting: Internal Medicine

## 2021-08-16 VITALS — BP 118/76 | HR 76 | Ht 66.0 in | Wt 356.8 lb

## 2021-08-16 DIAGNOSIS — R002 Palpitations: Secondary | ICD-10-CM | POA: Diagnosis not present

## 2021-08-16 DIAGNOSIS — Z6841 Body Mass Index (BMI) 40.0 and over, adult: Secondary | ICD-10-CM | POA: Diagnosis not present

## 2021-08-16 NOTE — Patient Instructions (Signed)
Medication Instructions:  ?No changes ?*If you need a refill on your cardiac medications before your next appointment, please call your pharmacy* ? ? ?Lab Work: ?none ?If you have labs (blood work) drawn today and your tests are completely normal, you will receive your results only by: ?MyChart Message (if you have MyChart) OR ?A paper copy in the mail ?If you have any lab test that is abnormal or we need to change your treatment, we will call you to review the results. ? ? ?Testing/Procedures: ?Your physician has requested that you have an echocardiogram. Echocardiography is a painless test that uses sound waves to create images of your heart. It provides your doctor with information about the size and shape of your heart and how well your heart?s chambers and valves are working. This procedure takes approximately one hour. There are no restrictions for this procedure. ? ?Zio Heart Monitor - 5 days - applied today ? ? ?Follow-Up: ?As needed based on results. ? ?Important Information About Sugar ? ? ? ? ?  ?

## 2021-08-16 NOTE — Progress Notes (Unsigned)
[id number] ZIO XT from office inventory applied to patient. ?

## 2021-08-22 ENCOUNTER — Encounter: Payer: Self-pay | Admitting: Plastic Surgery

## 2021-08-22 ENCOUNTER — Ambulatory Visit (INDEPENDENT_AMBULATORY_CARE_PROVIDER_SITE_OTHER): Payer: Medicare Other | Admitting: Plastic Surgery

## 2021-08-22 VITALS — HR 82 | Ht 67.0 in | Wt 353.2 lb

## 2021-08-22 DIAGNOSIS — N62 Hypertrophy of breast: Secondary | ICD-10-CM | POA: Diagnosis not present

## 2021-08-22 DIAGNOSIS — M545 Low back pain, unspecified: Secondary | ICD-10-CM

## 2021-08-22 DIAGNOSIS — M4004 Postural kyphosis, thoracic region: Secondary | ICD-10-CM

## 2021-08-22 DIAGNOSIS — M546 Pain in thoracic spine: Secondary | ICD-10-CM | POA: Diagnosis not present

## 2021-08-22 NOTE — Addendum Note (Signed)
Addended by: Sherol Dade on: 08/22/2021 01:14 PM ? ? Modules accepted: Orders ? ?

## 2021-08-22 NOTE — Progress Notes (Signed)
? ?Referring Provider ?[name], FNP ?[address] ?[address] ?[place],  [place] [address]  ? ?CC:  ?Chief Complaint  ?Patient presents with  ? Advice Only  ?   ? ?Erika Frey is an 26 y.o. female.  ?HPI: Patient presents to discuss breast reduction.  She has had years of back pain, neck pain and shoulder pain related to her large breasts.  She tried over-the-counter medications, warm packs, cold packs and supportive bras with little relief.  She is currently a 43 F and would like to be much smaller.  No previous breast biopsies or procedures herself.  She does have a family history of breast cancer with her mom.  She does not smoke and is not diabetic. ? ?Allergies  ?Allergen Reactions  ? Penicillin G   ?  Other reaction(s): Unknown  ? Penicillins Other (See Comments)  ?  Stomach cramps  ? ? ?Outpatient Encounter Medications as of 08/22/2021  ?Medication Sig Note  ? albuterol (PROVENTIL HFA;VENTOLIN HFA) 108 (90 BASE) MCG/ACT inhaler Inhale 2 puffs into the lungs every 6 (six) hours as needed.   ? Ashwagandha 125 MG CAPS Take 1 tablet by mouth at bedtime as needed.   ? benzonatate (TESSALON PERLES) 100 MG capsule Take 1 capsule (100 mg total) by mouth every 6 (six) hours as needed.   ? cetirizine (ZYRTEC) 10 MG tablet Take 1 tablet by mouth daily.   ? clobetasol cream (TEMOVATE) 0.05 % Apply 1 application topically 2 (two) times daily.   ? Dupilumab (DUPIXENT [place]) Inject into the skin.   ? ferrous sulfate 325 (65 FE) MG EC tablet Take 1 tablet (325 mg total) by mouth 2 (two) times daily.   ? Fluocinolone Acetonide 0.01 % OIL    ? fluticasone (FLONASE) 50 MCG/ACT nasal spray SHAKE LIQUID AND USE 2 SPRAYS IN EACH NOSTRIL DAILY   ? ibuprofen (ADVIL) 600 MG tablet Take 1 tablet (600 mg total) by mouth every 6 (six) hours as needed.   ? levocetirizine (XYZAL) 5 MG tablet Take 5 mg by mouth daily.   ? Magnesium 250 MG TABS Take 1 tablet (250 mg total) by mouth daily.   ? montelukast (SINGULAIR) 10 MG tablet Take 10  mg by mouth at bedtime.   ? montelukast (SINGULAIR) 10 MG tablet Take 1 tablet by mouth daily.   ? Norgestimate-Ethinyl Estradiol Triphasic (TRI-LO-SPRINTEC) 0.18/0.215/0.25 MG-25 MCG tab Take 1 tablet by mouth daily.   ? Olopatadine HCl 0.6 % SOLN Place 2 sprays into both nostrils 2 (two) times daily.   ? PATADAY 0.2 % SOLN  11/09/2014: Received from: External Pharmacy  ? pseudoephedrine (SUDAFED) 60 MG tablet Take 1 tablet (60 mg total) by mouth every 8 (eight) hours as needed for congestion.   ? tacrolimus (PROTOPIC) 0.1 % ointment Apply topically 2 (two) times daily. Large body surface area   ? tiZANidine (ZANAFLEX) 4 MG tablet Take 1 tablet (4 mg total) by mouth every 6 (six) hours as needed for muscle spasms.   ? Vitamin D, Ergocalciferol, (DRISDOL) 1.25 MG (50000 UNIT) CAPS capsule Take 1 capsule (50,000 Units total) by mouth every 7 (seven) days.   ? ?No facility-administered encounter medications on file as of 08/22/2021.  ?  ? ?Past Medical History:  ?Diagnosis Date  ? Absent menses   ? MOTHER STATES PT STARTED PERIOD AT AGE 25 BUT LAST PERIOD WAS 2 YRS AGO , AGE 16  ? Asthma   ? Eczema   ? Exotropia of left  eye   ? Immunizations up to date   ? Simple constipation   ? OCCASIONAL  ? Sinus headache   ? ? ?Past Surgical History:  ?Procedure Laterality Date  ? FOOT SURGERY  AGE 60  ? MEDIAN RECTUS REPAIR Left 12/16/2012  ? Procedure: MEDIAN RECTUS RESECTION LEFT EYE;  Surgeon: Corinda Gubler, MD;  Location: [facility];  Service: Ophthalmology;  Laterality: Left;  ? MUSCLE RECESSION AND RESECTION Left 12/16/2012  ? Procedure: LATERAL RECTUS RECESSION LEFT EYE;  Surgeon: Corinda Gubler, MD;  Location: [facility];  Service: Ophthalmology;  Laterality: Left;  ? TONSILLECTOMY AND ADENOIDECTOMY  AGE 88  ? ? ?Family History  ?Problem Relation Age of Onset  ? Hypertension Mother   ? Diabetes Mother   ? Heart disease Mother   ? Hypertension Maternal Grandmother   ? Heart disease Maternal  Grandmother   ? ? ?Social History  ? ?Social History Narrative  ? SENIOR AT [employer]  ?  ? ?Review of Systems ?General: Denies fevers, chills, weight loss ?CV: Denies chest pain, shortness of breath, palpitations ? ?Physical Exam ? ?  08/22/2021  ? 12:38 PM 08/16/2021  ?  9:02 AM 07/18/2021  ?  2:19 PM  ?Vitals with BMI  ?Height 5\' 7"  5\' 6"    ?Weight 353 lbs 3 oz 356 lbs 13 oz 354 lbs 10 oz  ?BMI 55.31 57.62   ?Systolic  118 110  ?Diastolic  76 68  ?Pulse 82 76 78  ?  ?General:  No acute distress,  Alert and oriented, Non-Toxic, Normal speech and affect ?Breast: She has grade 3 ptosis.  Sternal notch to nipple is 39 cm bilaterally.  Nipple to fold is 17 cm bilaterally.  No obvious scars or masses. ? ?Assessment/Plan ?Had a long discussion with the patient about her candidacy for breast reduction.  We discussed the surgery and the risks and benefits.  We discussed risks include bleeding, infection, damage to surrounding structures and need for additional procedures.  I explained given her current BMI of 55 she would not be a candidate for the procedure.  We are going to refer her to healthy weight and wellness and see her back in 6 months.  We will reevaluate at that time.  All of her questions were answered. ? ? ?08/22/2021, 1:02 PM  ? ? ?  ?

## 2021-08-27 DIAGNOSIS — R002 Palpitations: Secondary | ICD-10-CM | POA: Diagnosis not present

## 2021-08-31 ENCOUNTER — Other Ambulatory Visit: Payer: Self-pay | Admitting: Nurse Practitioner

## 2021-08-31 ENCOUNTER — Ambulatory Visit (HOSPITAL_COMMUNITY): Payer: Medicare Other | Attending: Cardiovascular Disease

## 2021-08-31 ENCOUNTER — Other Ambulatory Visit: Payer: Self-pay | Admitting: Physician Assistant

## 2021-08-31 DIAGNOSIS — R002 Palpitations: Secondary | ICD-10-CM | POA: Diagnosis not present

## 2021-08-31 DIAGNOSIS — L2089 Other atopic dermatitis: Secondary | ICD-10-CM

## 2021-08-31 DIAGNOSIS — Z6841 Body Mass Index (BMI) 40.0 and over, adult: Secondary | ICD-10-CM | POA: Diagnosis present

## 2021-08-31 LAB — ECHOCARDIOGRAM COMPLETE
Area-P 1/2: 3.68 cm2
S' Lateral: 3.9 cm

## 2021-08-31 MED ORDER — PERFLUTREN LIPID MICROSPHERE
3.0000 mL | INTRAVENOUS | Status: AC | PRN
  Administered 2021-08-31: 3 mL via INTRAVENOUS

## 2021-09-11 ENCOUNTER — Ambulatory Visit (INDEPENDENT_AMBULATORY_CARE_PROVIDER_SITE_OTHER): Payer: Medicare Other | Admitting: Neurology

## 2021-09-11 ENCOUNTER — Encounter: Payer: Self-pay | Admitting: Neurology

## 2021-09-11 VITALS — BP 119/73 | HR 87 | Ht 67.0 in | Wt 358.0 lb

## 2021-09-11 DIAGNOSIS — Z820 Family history of epilepsy and other diseases of the nervous system: Secondary | ICD-10-CM

## 2021-09-11 DIAGNOSIS — R0681 Apnea, not elsewhere classified: Secondary | ICD-10-CM

## 2021-09-11 DIAGNOSIS — R0683 Snoring: Secondary | ICD-10-CM

## 2021-09-11 DIAGNOSIS — Z9189 Other specified personal risk factors, not elsewhere classified: Secondary | ICD-10-CM | POA: Diagnosis not present

## 2021-09-11 DIAGNOSIS — G4719 Other hypersomnia: Secondary | ICD-10-CM

## 2021-09-11 DIAGNOSIS — Z6841 Body Mass Index (BMI) 40.0 and over, adult: Secondary | ICD-10-CM

## 2021-09-11 NOTE — Patient Instructions (Signed)

## 2021-09-11 NOTE — Progress Notes (Signed)
Subjective:  ?  ?Patient ID: Erika Frey is a 26 y.o. female. ? ?HPI ? ? ? ?Erika FoleySaima Chosen Garron, MD, PhD ?[facility] ?[address], [address] ?[phone] ?[place], [place] [id number] ? ?Dear Erika Frey,  ? ?I saw your patient, Erika Frey, upon your kind request, in my sleep clinic today for initial consultation of her sleep disorder, in particular, concern for underlying obstructive sleep apnea.  The patient is unaccompanied today.  As you know, Erika Frey is a 26 year old right-handed woman with underlying medical history of asthma, eczema, palpitations, recurrent headaches, constipation and morbid obesity with a BMI of over 50, who reports snoring and excessive daytime somnolence as well as witnessed apneas.  I reviewed your office note from 07/18/2021.  Her Epworth sleepiness score is 9 out of 24, fatigue severity score is 27 out of 63.  She reports a family history of sleep apnea, her mom has a CPAP machine, her maternal uncle and maternal aunt also have sleep apnea with CPAP machines.  She reports a bedtime between 11 and midnight typically.  Rise time is typically between 6:30 AM and 7 AM.  She has no night to night nocturia but has woken up occasionally with a headache.  She has a history of allergies and asthma.  She has seen cardiology for palpitations.  She had a tonsillectomy and adenoidectomy as a child.  Weight has been fluctuating.  She is working on weight loss and has lost about 10 pounds within the past month.  She works from home.  She is on disability.  She is a non-smoker and does not drink any alcohol and no daily caffeine currently, she was encouraged to stop caffeine per cardiology.  She lives with her mom and 27-year-old cousin. ? ? ?Her Past Medical History Is Significant For: ?Past Medical History:  ?Diagnosis Date  ? Absent menses   ? MOTHER STATES PT STARTED PERIOD AT AGE 15 BUT LAST PERIOD WAS 2 YRS AGO , AGE 66  ? Asthma   ? Eczema   ? Exotropia of left eye   ? Immunizations  up to date   ? Simple constipation   ? OCCASIONAL  ? Sinus headache   ? ? ?Her Past Surgical History Is Significant For: ?Past Surgical History:  ?Procedure Laterality Date  ? FOOT SURGERY  AGE 10  ? MEDIAN RECTUS REPAIR Left 12/16/2012  ? Procedure: MEDIAN RECTUS RESECTION LEFT EYE;  Surgeon: Corinda GublerMichael A Spencer, MD;  [facility] [place];  Service: Ophthalmology;  Laterality: Left;  ? MUSCLE RECESSION AND RESECTION Left 12/16/2012  ? Procedure: LATERAL RECTUS RECESSION LEFT EYE;  Surgeon: Corinda GublerMichael A Spencer, MD;  [facility] [place];  Service: Ophthalmology;  Laterality: Left;  ? TONSILLECTOMY AND ADENOIDECTOMY  AGE 58  ? ? ?Her Family History Is Significant For: ?Family History  ?Problem Relation Age of Onset  ? Hypertension Mother   ? Diabetes Mother   ? Heart disease Mother   ? Sleep apnea Mother   ? Hypertension Maternal Grandmother   ? Heart disease Maternal Grandmother   ? Sleep apnea Other   ? ? ?Her Social History Is Significant For: ?Social History  ? ?Socioeconomic History  ? Marital status: Single  ?  Spouse name: Not on file  ? Number of children: Not on file  ? Years of education: Not on file  ? Highest education level: High school graduate  ?Occupational History  ? Occupation: self   ?Tobacco Use  ?  Smoking status: Never  ? Smokeless tobacco: Never  ? Tobacco comments:  ?  NO SMOKER IN HOME  ?Vaping Use  ? Vaping Use: Never used  ?Substance and Sexual Activity  ? Alcohol use: No  ?  Alcohol/week: 0.0 standard drinks  ? Drug use: No  ? Sexual activity: Yes  ?  Birth control/protection: Pill  ?Other Topics Concern  ? Not on file  ?Social History Narrative  ? SENIOR AT [employer];  ?   ? Update 09/11/2021  ? Lives with her mother  ? Right handed  ? Caffeine: can't have caffeine due to heart palpitations   ? ?Social Determinants of Health  ? ?Financial Resource Strain: Low Risk   ? Difficulty of Paying Living Expenses: Not hard at all  ?Food Insecurity: No Food Insecurity  ?  Worried About Programme researcher, broadcasting/film/video in the Last Year: Never true  ? Ran Out of Food in the Last Year: Never true  ?Transportation Needs: No Transportation Needs  ? Lack of Transportation (Medical): No  ? Lack of Transportation (Non-Medical): No  ?Physical Activity: Inactive  ? Days of Exercise per Week: 0 days  ? Minutes of Exercise per Session: 0 min  ?Stress: No Stress Concern Present  ? Feeling of Stress : Not at all  ?Social Connections: Not on file  ? ? ?Her Allergies Are:  ?Allergies  ?Allergen Reactions  ? Penicillin G   ?  Other reaction(s): Unknown  ? Penicillins Other (See Comments)  ?  Stomach cramps  ?:  ? ?Her Current Medications Are:  ?Outpatient Encounter Medications as of 09/11/2021  ?Medication Sig  ? albuterol (PROVENTIL HFA;VENTOLIN HFA) 108 (90 BASE) MCG/ACT inhaler Inhale 2 puffs into the lungs every 6 (six) hours as needed.  ? Ashwagandha 125 MG CAPS Take 1 tablet by mouth at bedtime as needed.  ? cetirizine (ZYRTEC) 10 MG tablet Take 1 tablet by mouth daily.  ? clobetasol cream (TEMOVATE) 0.05 % Apply 1 application topically 2 (two) times daily.  ? ferrous sulfate 325 (65 FE) MG EC tablet Take 1 tablet (325 mg total) by mouth 2 (two) times daily.  ? Fluocinolone Acetonide 0.01 % OIL   ? fluticasone (FLONASE) 50 MCG/ACT nasal spray SHAKE LIQUID AND USE 2 SPRAYS IN EACH NOSTRIL DAILY  ? ibuprofen (ADVIL) 600 MG tablet Take 1 tablet (600 mg total) by mouth every 6 (six) hours as needed.  ? levocetirizine (XYZAL) 5 MG tablet Take 5 mg by mouth daily.  ? Magnesium 250 MG TABS Take 1 tablet (250 mg total) by mouth daily.  ? montelukast (SINGULAIR) 10 MG tablet Take 10 mg by mouth at bedtime.  ? Norgestimate-Ethinyl Estradiol Triphasic (TRI-LO-SPRINTEC) 0.18/0.215/0.25 MG-25 MCG tab Take 1 tablet by mouth daily.  ? pseudoephedrine (SUDAFED) 60 MG tablet Take 1 tablet (60 mg total) by mouth every 8 (eight) hours as needed for congestion.  ? tacrolimus (PROTOPIC) 0.1 % ointment APPLY TOPICALLY 2 TIMES A  DAY TO LARGE BODY SURFACE AREA  ? tiZANidine (ZANAFLEX) 4 MG tablet Take 1 tablet (4 mg total) by mouth every 6 (six) hours as needed for muscle spasms.  ? Vitamin D, Ergocalciferol, (DRISDOL) 1.25 MG (50000 UNIT) CAPS capsule Take 1 capsule (50,000 Units total) by mouth every 7 (seven) days.  ? benzonatate (TESSALON PERLES) 100 MG capsule Take 1 capsule (100 mg total) by mouth every 6 (six) hours as needed. (Patient not taking: Reported on 09/11/2021)  ? Dupilumab (DUPIXENT [place]) Inject into the skin. (  Patient not taking: Reported on 09/11/2021)  ? Olopatadine HCl 0.6 % SOLN Place 2 sprays into both nostrils 2 (two) times daily.  ? PATADAY 0.2 % SOLN   ? [DISCONTINUED] montelukast (SINGULAIR) 10 MG tablet Take 1 tablet by mouth daily.  ? ?No facility-administered encounter medications on file as of 09/11/2021.  ?: ? ? ?Review of Systems:  ?Out of a complete 14 point review of systems, all are reviewed and negative with the exception of these symptoms as listed below: ? ? ?Review of Systems  ?Neurological:   ?     Patient is here alone for a sleep consult. She states she has snored her whole life. She feels sometimes at night it wakes her up. She sleeps with her mouth open so it tends to be dry all of the time. Her mother has sleep apnea.   ? ?Objective:  ?Neurological Exam ? ?Physical Exam ?Physical Examination:  ? ?Vitals:  ? 09/11/21 1221  ?BP: 119/73  ?Pulse: 87  ? ? ?General Examination: The patient is a very pleasant 26 y.o. female in no acute distress. She appears well-developed and well-nourished and well groomed.  ? ?HEENT: Normocephalic, atraumatic, pupils are equal, round and reactive to light, extraocular tracking is good without limitation to gaze excursion or nystagmus noted. Hearing is grossly intact. Face is symmetric with normal facial animation. Speech is clear with no dysarthria noted. There is no hypophonia. There is no lip, neck/head, jaw or voice tremor. Neck is supple with full range of passive and  active motion. There are no carotid bruits on auscultation. Oropharynx exam reveals: mild mouth dryness, adequate dental hygiene and mild airway crowding, due to longer uvula but otherwise with benign findi

## 2021-09-20 ENCOUNTER — Telehealth: Payer: Self-pay | Admitting: Neurology

## 2021-09-20 NOTE — Telephone Encounter (Signed)
[employer] medicare/medicaid no auth req for [employer]. Patient is scheduled for Tuesday 10/09/21 for 8:00 pm.

## 2021-10-09 ENCOUNTER — Ambulatory Visit (INDEPENDENT_AMBULATORY_CARE_PROVIDER_SITE_OTHER): Payer: Medicare Other | Admitting: Neurology

## 2021-10-09 DIAGNOSIS — R0683 Snoring: Secondary | ICD-10-CM | POA: Diagnosis not present

## 2021-10-09 DIAGNOSIS — Z9189 Other specified personal risk factors, not elsewhere classified: Secondary | ICD-10-CM

## 2021-10-09 DIAGNOSIS — G4719 Other hypersomnia: Secondary | ICD-10-CM

## 2021-10-09 DIAGNOSIS — Z820 Family history of epilepsy and other diseases of the nervous system: Secondary | ICD-10-CM

## 2021-10-09 DIAGNOSIS — R0681 Apnea, not elsewhere classified: Secondary | ICD-10-CM

## 2021-10-09 DIAGNOSIS — G4720 Circadian rhythm sleep disorder, unspecified type: Secondary | ICD-10-CM

## 2021-10-19 NOTE — Procedures (Signed)
PATIENT'S NAME:  Erika Frey, Erika Frey DOB:      February 28, 1996      MR#:    [id number]     DATE OF RECORDING: 10/09/2021 REFERRING M.D.:  [name], FNP Study Performed:   Baseline Polysomnogram HISTORY: 26 year old woman with a history of asthma, eczema, palpitations, recurrent headaches, constipation and morbid obesity with a BMI of over 50, who reports snoring and excessive daytime somnolence as well as witnessed apneas. The patient endorsed the Epworth Sleepiness Scale at 9 points. The patient's weight 358 pounds with a height of 67 (inches), resulting in a BMI of 56.1 kg/m2. The patient's neck circumference measured 16.5 inches.  CURRENT MEDICATIONS: Proventil HFA, Ashwagandha, Zyrtec, Temovate, Ferrous sulfate, Fluocinolone Acetonide, Flonase, Advil, Xyzal, Magnesium, Tri-Lo sprintec, sudafed, Protopic, Zanaflex, Disdol, Tessalon Perles, Dupixent [place], Olopatadine HCI,   PROCEDURE:  This is a multichannel digital polysomnogram utilizing the Somnostar 11.2 system.  Electrodes and sensors were applied and monitored per AASM Specifications.   EEG, EOG, Chin and Limb EMG, were sampled at 200 Hz.  ECG, Snore and Nasal Pressure, Thermal Airflow, Respiratory Effort, CPAP Flow and Pressure, Oximetry was sampled at 50 Hz. Digital video and audio were recorded.      BASELINE STUDY  Lights Out was at 20:45 and Lights On at 04:51.  Total recording time (TRT) was 486.5 minutes, with a total sleep time (TST) of 290.5 minutes.   The patient's sleep latency was 158 minutes, which is markedly delayed. REM latency was 185.5 minutes, which is markedly delayed. The sleep efficiency was 59.7 %.     SLEEP ARCHITECTURE: WASO (Wake after sleep onset) was 42 minutes, with mild sleep fragmentation noted, and one longer period of wakefulness. There were 13 minutes in Stage N1, 209.5 minutes Stage N2, 55.5 minutes Stage N3 and 12.5 minutes in Stage REM.  The percentage of Stage N1 was 4.5%, Stage N2 was 72.1%, which is markedly  increased, Stage N3 was 19.1%, which is normal, and Stage R (REM sleep) was 4.3%, which is markedly reduced. The arousals were noted as: 26 were spontaneous, 0 were associated with PLMs, 2 were associated with respiratory events.  RESPIRATORY ANALYSIS:  There were a total of 11 respiratory events:  1 obstructive apneas, 0 central apneas and 0 mixed apneas with a total of 1 apneas and an apnea index (AI) of .2 /hour. There were 10 hypopneas with a hypopnea index of 2.1 /hour. The patient also had 0 respiratory event related arousals (RERAs).      The total APNEA/HYPOPNEA INDEX (AHI) was 2.3/hour and the total RESPIRATORY DISTURBANCE INDEX was  2.3 /hour.  11 events occurred in REM sleep and 0 events in NREM. The REM AHI was  52.8 /hour, versus a non-REM AHI of 0. The patient spent 290.5 minutes of total sleep time in the supine position and 0 minutes in non-supine.. The supine AHI was 2.3 versus a non-supine AHI of 0.0.  OXYGEN SATURATION & C02:  The Wake baseline 02 saturation was 98%, with the lowest being 86% during REM sleep (64% nadir on technical report was erroneous, due to dislodged sensor). Time spent below 89% saturation equaled 3 minutes (which is overestimated).  PERIODIC LIMB MOVEMENTS: The patient had a total of 0 Periodic Limb Movements.  The Periodic Limb Movement (PLM) index was 0 and the PLM Arousal index was 0/hour.  Audio and video analysis did not show any abnormal or unusual movements, behaviors, phonations or vocalizations. The patient slept on 3 pillows. The patient took  1 bathroom break. Mild intermittent snoring was noted. The EKG was in keeping with normal sinus rhythm (NSR).  Post-study, the patient indicated that sleep was the same as usual.   IMPRESSION:  Primary Snoring Dysfunctions associated with sleep stages or arousal from sleep  RECOMMENDATIONS:  This study does not demonstrate any significant obstructive or central sleep disordered breathing, with the  exception of mild snoring and evidence of REM related OSA. The use of a positive airway pressure device, such as CPAP or autoPAP is not indicated, based on this test. Of note, the study was limited due to reduced sleep efficiency, and reduced percentage of REM sleep. Weight loss and avoidance of the supine sleep position with likely aid in reducing snoring and REM related sleep apnea.  This study shows sleep fragmentation and abnormal sleep stage percentages; these are nonspecific findings and per se do not signify an intrinsic sleep disorder or a cause for the patient's sleep-related symptoms. Causes include (but are not limited to) the first night effect of the sleep study, circadian rhythm disturbances, medication effect or an underlying mood disorder or medical problem.  The patient should be cautioned not to drive, work at heights, or operate dangerous or heavy equipment when tired or sleepy. Review and reiteration of good sleep hygiene measures should be pursued with any patient. The patient will be advised to follow up with the referring provider, who will be notified of the test results.  I certify that I have reviewed the entire raw data recording prior to the issuance of this report in accordance with the Standards of Accreditation of the American Academy of Sleep Medicine (AASM)   Huston Foley, MD, PhD Diplomat, American Board of Neurology and Sleep Medicine (Neurology and Sleep Medicine)

## 2021-10-23 ENCOUNTER — Telehealth: Payer: Self-pay

## 2021-10-23 ENCOUNTER — Ambulatory Visit: Payer: Medicare Other | Admitting: Nurse Practitioner

## 2021-10-23 NOTE — Telephone Encounter (Signed)
-----   Message from Huston Foley, MD sent at 10/19/2021 11:36 AM EDT ----- Patient referred by PCP, seen by me on 09/11/21, diagnostic PSG on 10/09/21.   Please call and notify the patient that the recent sleep study did not show any significant obstructive sleep apnea with the exception of mild snoring and evidence of REM related OSA. The use of a positive airway pressure device, such as CPAP or autoPAP is not indicated, based on this test. Weight loss and avoidance of the supine sleep position with likely aid in reducing snoring and REM related sleep apnea. She can follow up with her PCP as scheduled/planned.   Thanks,  Huston Foley, MD, PhD [facility])

## 2021-10-23 NOTE — Telephone Encounter (Signed)
I called patient to discuss. No answer, left a message asking her to call me back. I will send patient a mychart message as well.

## 2021-10-29 ENCOUNTER — Telehealth: Payer: Self-pay | Admitting: *Deleted

## 2021-11-03 ENCOUNTER — Ambulatory Visit (HOSPITAL_COMMUNITY)
Admission: RE | Admit: 2021-11-03 | Discharge: 2021-11-03 | Disposition: A | Discharge status: Home / Self Care | Payer: Medicare Other | Source: Ambulatory Visit | Attending: Emergency Medicine | Admitting: Emergency Medicine

## 2021-11-03 VITALS — BP 145/92 | HR 102 | Temp 98.6°F | Resp 16

## 2021-11-03 DIAGNOSIS — H1032 Unspecified acute conjunctivitis, left eye: Secondary | ICD-10-CM

## 2021-11-03 MED ORDER — POLYMYXIN B-TRIMETHOPRIM 10000-0.1 UNIT/ML-% OP SOLN: 2.0000 [drp] | Freq: Three times a day (TID) | OPHTHALMIC | 0 refills | Status: DC

## 2021-11-03 NOTE — ED Provider Notes (Signed)
[facility]    CSN: [id number] Arrival date & time: 11/03/21  1445     History   Chief Complaint Chief Complaint  Patient presents with   Eye Drainage    HPI Erika Frey is a 26 y.o. female.  Presents with 1 week history of left eye irritation.  It began getting red over the last 2 days.  She notes some mucus drainage from the eye.  She woke up this morning and her eye was matted shut.  Denies any symptoms of the right eye. History of sinus problems and chronic congestion.  Denies any nasal symptoms at this time.  No ear pain or pressure.  Denies fevers.  No abdominal pain, nausea, vomiting/diarrhea. Denies pain with eye movements.  No pain around the eye.  She has been dissolving salt in water and using this in her left eye.  History of left eye exotropia s/p muscle resection.  Gaze appears aligned. T&A.  Past Medical History:  Diagnosis Date   Absent menses    MOTHER STATES PT STARTED PERIOD AT AGE 52 BUT LAST PERIOD WAS 2 YRS AGO , AGE 69   Asthma    Eczema    Exotropia of left eye    Immunizations up to date    Simple constipation    OCCASIONAL   Sinus headache     Patient Active Problem List   Diagnosis Date Noted   Exotropia of left eye 12/15/2012    Past Surgical History:  Procedure Laterality Date   FOOT SURGERY  AGE 50   MEDIAN RECTUS REPAIR Left 12/16/2012   Procedure: MEDIAN RECTUS RESECTION LEFT EYE;  Surgeon: Corinda Gubler, MD;  Location: [facility];  Service: Ophthalmology;  Laterality: Left;   MUSCLE RECESSION AND RESECTION Left 12/16/2012   Procedure: LATERAL RECTUS RECESSION LEFT EYE;  Surgeon: Corinda Gubler, MD;  Location: [facility];  Service: Ophthalmology;  Laterality: Left;   TONSILLECTOMY AND ADENOIDECTOMY  AGE 6    OB History   No obstetric history on file.      Home Medications    Prior to Admission medications   Medication Sig Start Date End Date Taking? Authorizing Provider   trimethoprim-polymyxin b (POLYTRIM) ophthalmic solution Place 2 drops into both eyes 3 (three) times daily for 5 days. 11/03/21 11/08/21 Yes Verdean Murin, Lurena Joiner, PA-C  albuterol (PROVENTIL HFA;VENTOLIN HFA) 108 (90 BASE) MCG/ACT inhaler Inhale 2 puffs into the lungs every 6 (six) hours as needed.    [provider]  Ashwagandha 125 MG CAPS Take 1 tablet by mouth at bedtime as needed. 07/18/21   [name], FNP  cetirizine (ZYRTEC) 10 MG tablet Take 1 tablet by mouth daily. 05/30/20   [provider]  clobetasol cream (TEMOVATE) 0.05 % Apply 1 application topically 2 (two) times daily. 06/05/21   [name], FNP  ferrous sulfate 325 (65 FE) MG EC tablet Take 1 tablet (325 mg total) by mouth 2 (two) times daily. 07/20/21 07/20/22  [name], FNP  Fluocinolone Acetonide 0.01 % OIL  07/13/20   [provider]  fluticasone (FLONASE) 50 MCG/ACT nasal spray SHAKE LIQUID AND USE 2 SPRAYS IN [facility] NOSTRIL DAILY 12/14/20   [provider]  ibuprofen (ADVIL) 600 MG tablet Take 1 tablet (600 mg total) by mouth every 6 (six) hours as needed. 02/26/21   Ivette Loyal, NP  levocetirizine (XYZAL) 5 MG tablet Take 5 mg by mouth daily. 12/22/20   [provider]  Magnesium 250 MG TABS Take 1 tablet (250 mg total) by mouth daily. 07/18/21   [name], FNP  montelukast (SINGULAIR) 10 MG tablet Take 10 mg by mouth at bedtime.    [provider]  Norgestimate-Ethinyl Estradiol Triphasic (TRI-LO-SPRINTEC) 0.18/0.215/0.25 MG-25 MCG tab Take 1 tablet by mouth daily. 09/12/20   [name], FNP  Olopatadine HCl 0.6 % SOLN Place 2 sprays into both nostrils 2 (two) times daily. 09/21/20   [provider]  pseudoephedrine (SUDAFED) 60 MG tablet Take 1 tablet (60 mg total) by mouth every 8 (eight) hours as needed for congestion. 01/10/14   Roxy Horseman, PA-C  tacrolimus (PROTOPIC) 0.1 % ointment APPLY TOPICALLY 2 TIMES A DAY TO LARGE BODY SURFACE AREA 09/03/21    Sheffield, Kelli R, PA-C  tiZANidine (ZANAFLEX) 4 MG tablet Take 1 tablet (4 mg total) by mouth every 6 (six) hours as needed for muscle spasms. 02/26/21   Ivette Loyal, NP  Vitamin D, Ergocalciferol, (DRISDOL) 1.25 MG (50000 UNIT) CAPS capsule Take 1 capsule (50,000 Units total) by mouth every 7 (seven) days. 12/18/20   Charlesetta Ivory, NP    Family History Family History  Problem Relation Age of Onset   Hypertension Mother    Diabetes Mother    Heart disease Mother    Sleep apnea Mother    Hypertension Maternal Grandmother    Heart disease Maternal Grandmother    Sleep apnea Other     Social History Social History   Tobacco Use   Smoking status: Never   Smokeless tobacco: Never   Tobacco comments:    NO SMOKER IN HOME  Vaping Use   Vaping Use: Never used  Substance Use Topics   Alcohol use: No    Alcohol/week: 0.0 standard drinks of alcohol   Drug use: No     Allergies   Penicillin g and Penicillins   Review of Systems Review of Systems  Per HPI  Physical Exam Triage Vital Signs ED Triage Vitals [11/03/21 1452]  Enc Vitals Group     BP (!) 145/92     Pulse Rate (!) 102     Resp 16     Temp 98.6 F (37 C)     Temp Source Oral     SpO2 97 %     Weight      Height      Head Circumference      Peak Flow      Pain Score      Pain Loc      Pain Edu?      Excl. in GC?    No data found.  Updated Vital Signs BP (!) 145/92 (BP Location: Left Arm)   Pulse (!) 102   Temp 98.6 F (37 C) (Oral)   Resp 16   SpO2 97%    Physical Exam Vitals and nursing note reviewed.  Constitutional:      Appearance: Normal appearance.  HENT:     Head: Normocephalic and atraumatic.     Right Ear: Tympanic membrane and ear canal normal.     Left Ear: Tympanic membrane and ear canal normal.     Nose: No congestion.     Mouth/Throat:     Mouth: Mucous membranes are moist.     Pharynx: Oropharynx is clear. No oropharyngeal exudate or posterior oropharyngeal  erythema.  Eyes:     General: Lids are normal. Vision grossly intact. Gaze aligned appropriately.        Left eye:  No foreign body or discharge.     Extraocular Movements: Extraocular movements intact.     Conjunctiva/sclera:     Left eye: Left conjunctiva is injected.     Pupils: Pupils are equal, round, and reactive to light.  Cardiovascular:     Rate and Rhythm: Normal rate and regular rhythm.     Pulses: Normal pulses.     Heart sounds: Normal heart sounds.  Pulmonary:     Effort: Pulmonary effort is normal.     Breath sounds: Normal breath sounds.  Musculoskeletal:     Cervical back: Normal range of motion.  Lymphadenopathy:     Cervical: No cervical adenopathy.  Skin:    General: Skin is warm and dry.  Neurological:     Mental Status: She is alert and oriented to person, place, and time.     UC Treatments / Results  Labs (all labs ordered are listed, but only abnormal results are displayed) Labs Reviewed - No data to display  EKG  Radiology No results found.  Procedures Procedures   Medications Ordered in UC Medications - No data to display  Initial Impression / Assessment and Plan / UC Course  I have reviewed the triage vital signs and the nursing notes.  Pertinent labs & imaging results that were available during my care of the patient were reviewed by me and considered in my medical decision making (see chart for details).  Polytrim drops 3 times a day into both eyes.  I recommend she stop dissolving table salt and water and putting this in her eye.  She understands this may make her symptoms worse. Return precautions discussed. Emergency department if symptoms worsen. Patient agrees to plan and is discharged in stable condition.  Final Clinical Impressions(s) / UC Diagnoses   Final diagnoses:  Acute conjunctivitis of left eye, unspecified acute conjunctivitis type     Discharge Instructions      Use the eyedrops 3 times daily.  Please discontinue  use of salt water in the eye.  Please go to the emergency department if your symptoms worsen.     ED Prescriptions     Medication Sig Dispense Auth. Provider   trimethoprim-polymyxin b (POLYTRIM) ophthalmic solution Place 2 drops into both eyes 3 (three) times daily for 5 days. 10 mL Charlie Char, Lurena Joiner, PA-C      PDMP not reviewed this encounter.   Manal Kreutzer, Ray Church 11/03/21 1548

## 2021-11-03 NOTE — Discharge Instructions (Signed)
Use the eyedrops 3 times daily.  Please discontinue use of salt water in the eye.  Please go to the emergency department if your symptoms worsen.

## 2021-11-03 NOTE — ED Triage Notes (Signed)
Pt reports left eye pain, red and itchy x 1 week. She reports some clear mucous coming out.

## 2021-11-05 ENCOUNTER — Encounter: Payer: Self-pay | Admitting: Nurse Practitioner

## 2021-11-05 ENCOUNTER — Ambulatory Visit (INDEPENDENT_AMBULATORY_CARE_PROVIDER_SITE_OTHER): Payer: Medicare Other | Admitting: Nurse Practitioner

## 2021-11-05 VITALS — BP 118/64 | HR 88 | Temp 98.1°F | Ht 67.0 in | Wt 355.0 lb

## 2021-11-05 DIAGNOSIS — Z6841 Body Mass Index (BMI) 40.0 and over, adult: Secondary | ICD-10-CM

## 2021-11-05 DIAGNOSIS — H1033 Unspecified acute conjunctivitis, bilateral: Secondary | ICD-10-CM | POA: Diagnosis not present

## 2021-11-05 MED ORDER — MONTELUKAST SODIUM 10 MG PO TABS: 10.0000 mg | ORAL_TABLET | Freq: Every day | ORAL | 2 refills | Status: DC

## 2021-11-05 MED ORDER — ERYTHROMYCIN 5 MG/GM OP OINT: 1.0000 | TOPICAL_OINTMENT | Freq: Every day | OPHTHALMIC | 0 refills | Status: DC

## 2021-11-05 NOTE — Patient Instructions (Addendum)
Call your insurance to see if they cover Saxenda for weight loss.   Viral Conjunctivitis, Adult  Viral conjunctivitis is an inflammation of the clear membrane that covers the white part of the eye and the inner surface of the eyelid (conjunctiva). The inflammation is caused by a viral infection. The blood vessels in the conjunctiva become enlarged, causing the eye to become red or pink and often itchy. It usually starts in one eye and goes to the other in a day or two. Infections often resolve over 1-2 weeks. Viral conjunctivitis is contagious. This means it can be easily passed from one person to another. This condition is often called pink eye. What are the causes? This condition is caused by a virus. It can be spread by touching objects that have been contaminated with the virus, such as doorknobs or towels, and then touching your eye. It can also be passed through tiny droplets, such as from coughing or sneezing. What increases the risk? You are more likely to develop this condition if you have a cold or the flu, or are in close contact with a person with pink eye. What are the signs or symptoms? Symptoms of this condition include: Redness in the eye. Tearing or watery eyes. Itchy and irritated eyes. Burning feeling in the eyes. Clear drainage from the eye. Swollen eyelids. A gritty feeling in the eye. Light sensitivity. This condition often occurs with other symptoms, such as nasal congestion, cough, and fever. How is this diagnosed? This condition is diagnosed with a medical history and physical exam. If you have discharge from your eye, the discharge may be tested to rule out other causes of conjunctivitis. How is this treated? Viral conjunctivitis does not respond to medicines that kill bacteria (antibiotics). The condition most often resolves on its own in 1-2 weeks. If treatment is needed, it is aimed at relieving your symptoms and preventing the spread of infection. This may be done  with artificial tear drops, antihistamine drops, or other eye medicines. In rare cases, steroid eye drops or anti-herpes virus medicines may be prescribed. Follow these instructions at home: Medicines  Take or apply over-the-counter and prescription medicines only as told by your health care provider. Do not touch the edge of the eyelid with the eye-drop bottle or ointment tube when applying medicines to the affected eye. This will prevent the spread of the infection to the other eye or to other people. Eye care Avoid touching or rubbing your eyes. Apply a clean, cool, wet washcloth onto your eye for 10-20 minutes, 3-4 times per day, or as told by your health care provider. If you wear contact lenses, do not wear them until the inflammation is gone and your health care provider says it is safe to wear them again. Ask your health care provider how to disinfect or replace your contact lenses before using them again. Wear glasses until you can resume wearing contacts. Avoid wearing eye makeup until the inflammation is gone. Throw away any old eye cosmetics that may be contaminated. Gently wipe away any crusting from your eye with a wet washcloth or a cotton ball. General instructions Change or wash your pillowcase every day or as told by your health care provider. Do not share towels, pillowcases, washcloths, eye makeup, makeup brushes, contact lenses, or eyeglasses. This may spread the infection. Wash your hands often with soap and water. Use paper towels to dry your hands. If soap and water are not available, use hand sanitizer. Avoid contact with  other people until your eye is no longer red and tearing, or as told by your health care provider. Contact a health care provider if: Your symptoms do not improve with treatment, or they get worse. You have increased pain. Your vision becomes blurry. You have a fever. You have facial pain, redness, or swelling. You have yellow or green drainage coming  from your eye. You have new symptoms. Get help right away if: You develop severe pain. Your vision gets much worse. Summary Viral conjunctivitis is an inflammation of the clear membrane that covers the white part of the eye and the inner surface of the eyelid. It usually goes away in 1-2 weeks. This condition is usually treated with medicines and cold compresses. Treatment focuses on relieving the symptoms. This condition is very contagious. To prevent infection, avoid close contact with others, wash your hands often, and do not share towels or washcloths. Contact a health care provider if your symptoms do not go away with treatment, or if you have more pain, poor vision, or swelling in the eyes. Get help right away if you have severe pain or your vision gets much worse. This information is not intended to replace advice given to you by your health care provider. Make sure you discuss any questions you have with your health care provider. Document Revised: 03/22/2019 Document Reviewed: 03/05/2019 Elsevier Patient Education  2022 [employer].

## 2021-11-05 NOTE — Progress Notes (Signed)
I,Erika Frey,acting as a Neurosurgeon for [employer], FNP.,have documented all relevant documentation on the behalf of [name], FNP,as directed by  [name], FNP while in the presence of [name], FNP.  Subjective:     Patient ID: Erika Frey , female    DOB: 26-Sep-1995 , 26 y.o.   MRN: [id number]   Chief Complaint  Patient presents with   Eye Drainage    HPI  Patient presents today for eye pain and discharge. 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      Past Medical History:  Diagnosis Date   Absent menses    MOTHER STATES PT STARTED PERIOD AT AGE 8 BUT LAST PERIOD WAS 2 YRS AGO , AGE 71   Asthma    Eczema    Exotropia of left eye    Immunizations up to date    Simple constipation    OCCASIONAL   Sinus headache      Family History  Problem Relation Age of Onset   Hypertension Mother    Diabetes Mother    Heart disease Mother    Sleep apnea Mother    Hypertension Maternal Grandmother    Heart disease Maternal Grandmother    Sleep apnea Other      Current Outpatient Medications:    erythromycin ophthalmic ointment, Place 1 Application into both eyes at bedtime., Disp: 3.5 g, Rfl: 0   albuterol (PROVENTIL HFA;VENTOLIN HFA) 108 (90 BASE) MCG/ACT inhaler, Inhale 2 puffs into the lungs every 6 (six) hours as needed., Disp: , Rfl:    Ashwagandha 125 MG CAPS, Take 1 tablet by mouth at bedtime as needed., Disp: 30 capsule, Rfl: 3   cetirizine (ZYRTEC) 10 MG tablet, Take 1 tablet by mouth daily., Disp: , Rfl:    clobetasol cream (TEMOVATE) 0.05 %, Apply 1 application topically 2 (two) times daily., Disp: 30 g, Rfl: 3   ferrous sulfate 325 (65 FE) MG EC tablet, Take 1 tablet (325 mg total) by mouth 2 (two) times daily., Disp: 60 tablet, Rfl: 3   Fluocinolone Acetonide 0.01 % OIL, , Disp: , Rfl:    fluticasone (FLONASE) 50 MCG/ACT nasal spray, SHAKE LIQUID AND USE 2 SPRAYS IN EACH NOSTRIL DAILY, Disp: , Rfl:    ibuprofen (ADVIL)  600 MG tablet, Take 1 tablet (600 mg total) by mouth every 6 (six) hours as needed., Disp: 30 tablet, Rfl: 0   levocetirizine (XYZAL) 5 MG tablet, Take 5 mg by mouth daily., Disp: , Rfl:    Magnesium 250 MG TABS, Take 1 tablet (250 mg total) by mouth daily., Disp: 30 tablet, Rfl: 2   montelukast (SINGULAIR) 10 MG tablet, Take 1 tablet (10 mg total) by mouth at bedtime., Disp: 30 tablet, Rfl: 2   Norgestimate-Ethinyl Estradiol Triphasic (TRI-LO-SPRINTEC) 0.18/0.215/0.25 MG-25 MCG tab, Take 1 tablet by mouth daily., Disp: 74 tablet, Rfl: 3   Olopatadine HCl 0.6 % SOLN, Place 2 sprays into both nostrils 2 (two) times daily., Disp: , Rfl:    pseudoephedrine (SUDAFED) 60 MG tablet, Take 1 tablet (60 mg total) by mouth every 8 (eight) hours as needed for congestion., Disp: 15 tablet, Rfl: 0   tacrolimus (PROTOPIC) 0.1 % ointment, APPLY TOPICALLY 2 TIMES A DAY TO LARGE BODY SURFACE AREA, Disp: 60 g, Rfl: 2   tiZANidine (ZANAFLEX) 4 MG tablet, Take 1 tablet (4 mg total) by mouth every 6 (six) hours as needed for muscle spasms., Disp: 30 tablet, Rfl: 0  Vitamin D, Ergocalciferol, (DRISDOL) 1.25 MG (50000 UNIT) CAPS capsule, Take 1 capsule (50,000 Units total) by mouth every 7 (seven) days., Disp: 12 capsule, Rfl: 0   Allergies  Allergen Reactions   Penicillin G     Other reaction(s): Unknown   Penicillins Other (See Comments)    Stomach cramps     Review of Systems  Constitutional: Negative.   Eyes:  Positive for pain, discharge and redness.  Respiratory: Negative.    Cardiovascular: Negative.   Gastrointestinal: Negative.   Neurological: Negative.   Psychiatric/Behavioral: Negative.       Today's Vitals   11/05/21 1412  BP: 118/64  Pulse: 88  Temp: 98.1 F (36.7 C)  TempSrc: Oral  Weight: (!) 355 lb (161 kg)  Height: 5\' 7"  (1.702 m)   Body mass index is 55.6 kg/m.  Wt Readings from Last 3 Encounters:  11/05/21 (!) 355 lb (161 kg)  09/11/21 (!) 358 lb (162.4 kg)  08/22/21 (!) 353  lb 3.2 oz (160.2 kg)    Objective:  Physical Exam Vitals reviewed.  Constitutional:      Appearance: Normal appearance. She is well-developed.  HENT:     Head: Normocephalic and atraumatic.  Eyes:     Pupils: Pupils are equal, round, and reactive to light.  Cardiovascular:     Rate and Rhythm: Normal rate and regular rhythm.     Pulses: Normal pulses.     Heart sounds: Normal heart sounds. No murmur heard. Pulmonary:     Effort: Pulmonary effort is normal. No respiratory distress.     Breath sounds: Normal breath sounds. No wheezing.  Musculoskeletal:        General: Normal range of motion.  Skin:    General: Skin is warm and dry.     Capillary Refill: Capillary refill takes less than 2 seconds.  Neurological:     General: No focal deficit present.     Mental Status: She is alert and oriented to person, place, and time.     Cranial Nerves: No cranial nerve deficit.     Motor: No weakness.  Psychiatric:        Mood and Affect: Mood normal.        Behavior: Behavior normal.        Thought Content: Thought content normal.        Judgment: Judgment normal.         Assessment And Plan:     1. Acute conjunctivitis of both eyes, unspecified acute conjunctivitis type Comments: slight redness to bilateral sclera, with small amount of discharge to right eye. Will treat for conjunctivitis.  - erythromycin ophthalmic ointment; Place 1 Application into both eyes at bedtime.  Dispense: 3.5 g; Refill: 0  2. Class 3 severe obesity due to excess calories without serious comorbidity with body mass index (BMI) of 50.0 to 59.9 in adult [facility])  She is encouraged to strive for BMI less than 30 to decrease cardiac risk. Advised to aim for at least 150 minutes of exercise per week.    Patient was given opportunity to ask questions. Patient verbalized understanding of the plan and was able to repeat key elements of the plan. All questions were answered to their satisfaction.  [facility],  FNP   I, [name], FNP, have reviewed all documentation for this visit. The documentation on 11/05/21 for the exam, diagnosis, procedures, and orders are all accurate and complete.   IF YOU HAVE BEEN REFERRED TO A SPECIALIST, IT MAY TAKE  1-2 WEEKS TO SCHEDULE/PROCESS THE REFERRAL. IF YOU HAVE NOT HEARD FROM US/SPECIALIST IN TWO WEEKS, PLEASE GIVE [place] A CALL AT [phone].   THE PATIENT IS ENCOURAGED TO PRACTICE SOCIAL DISTANCING DUE TO THE COVID-19 PANDEMIC.

## 2021-11-08 ENCOUNTER — Encounter: Payer: Medicare Other | Admitting: Nurse Practitioner

## 2022-01-02 ENCOUNTER — Ambulatory Visit (HOSPITAL_COMMUNITY)
Admission: EM | Admit: 2022-01-02 | Discharge: 2022-01-02 | Disposition: A | Discharge status: Home / Self Care | Payer: Medicare Other | Attending: Family Medicine | Admitting: Family Medicine

## 2022-01-02 ENCOUNTER — Encounter (HOSPITAL_COMMUNITY): Payer: Self-pay | Admitting: Emergency Medicine

## 2022-01-02 DIAGNOSIS — J069 Acute upper respiratory infection, unspecified: Secondary | ICD-10-CM | POA: Diagnosis not present

## 2022-01-02 DIAGNOSIS — R0981 Nasal congestion: Secondary | ICD-10-CM

## 2022-01-02 NOTE — ED Triage Notes (Signed)
Saturday had scratchy throat, congestion, ears feel clogged. Tried taking OTC medications for sinus without relief.

## 2022-01-02 NOTE — ED Provider Notes (Signed)
[facility]   [id number] 01/02/22 Arrival Time: 1503  ASSESSMENT & PLAN:  1. Viral upper respiratory tract infection   2. Nasal congestion    Discussed typical duration of viral illnesses.   Discharge Instructions      You may use over the counter AFRIN for the next 2-3 nights. This will help you breathe through your nose. Avoid prolonged/frequent use. Continue all allergy medicines you are taking.     OTC symptom care as needed.   Follow-up Information     [name], FNP.   Specialty: General Practice Why: As needed. Contact information: [address] [place] [address] [phone]                 Reviewed expectations re: course of current medical issues. Questions answered. Outlined signs and symptoms indicating need for more acute intervention. Understanding verbalized. After Visit Summary given.   SUBJECTIVE: History from: Patient. Erika Frey is a 26 y.o. female. Reports: nasal pressure/congestion; trouble breathing through her nose, fatigue, bilateral otalgia; abrupt onset approx 3-4 d ago. Denies: fever. Normal PO intake without n/v/d.  OBJECTIVE:  Vitals:   01/02/22 1541  BP: 136/74  Pulse: 94  Resp: 20  Temp: 98 F (36.7 C)  TempSrc: Oral  SpO2: 97%    General appearance: alert; no distress Eyes: PERRLA; EOMI; conjunctiva normal HENT: [place]; AT; with nasal congestion; turbinates boggy Neck: supple  Lungs: speaks full sentences without difficulty; unlabored Extremities: no edema Skin: warm and dry Neurologic: normal gait Psychological: alert and cooperative; normal mood and affect  Allergies  Allergen Reactions   Penicillin G     Other reaction(s): Unknown   Penicillins Other (See Comments)    Stomach cramps    Past Medical History:  Diagnosis Date   Absent menses    MOTHER STATES PT STARTED PERIOD AT AGE 2 BUT LAST PERIOD WAS 2 YRS AGO , AGE 43   Asthma    Eczema    Exotropia of left  eye    Immunizations up to date    Simple constipation    OCCASIONAL   Sinus headache    Social History   Socioeconomic History   Marital status: Single    Spouse name: Not on file   Number of children: Not on file   Years of education: Not on file   Highest education level: High school graduate  Occupational History   Occupation: self   Tobacco Use   Smoking status: Never   Smokeless tobacco: Never   Tobacco comments:    NO SMOKER IN HOME  Vaping Use   Vaping Use: Never used  Substance and Sexual Activity   Alcohol use: No    Alcohol/week: 0.0 standard drinks of alcohol   Drug use: No   Sexual activity: Yes    Birth control/protection: Pill  Other Topics Concern   Not on file  Social History Narrative   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;      Update 09/11/2021   Lives with her mother   Right handed   Caffeine: can't have caffeine due to heart palpitations    Social Determinants of Health   Financial Resource Strain: Low Risk  (01/25/2021)   Overall Financial Resource Strain (CARDIA)    Difficulty of Paying Living Expenses: Not hard at all  Food Insecurity: No Food Insecurity (01/25/2021)   Hunger Vital Sign    Worried About Running Out of Food in the Last Year: Never true    Ran  Out of Food in the Last Year: Never true  Transportation Needs: No Transportation Needs (01/25/2021)   PRAPARE - Administrator, Civil Service (Medical): No    Lack of Transportation (Non-Medical): No  Physical Activity: Inactive (01/25/2021)   Exercise Vital Sign    Days of Exercise per Week: 0 days    Minutes of Exercise per Session: 0 min  Stress: No Stress Concern Present (01/25/2021)   [employer] of Occupational Health - Occupational Stress Questionnaire    Feeling of Stress : Not at all  Social Connections: Not on file  Intimate Partner Violence: Not on file   Family History  Problem Relation Age of Onset   Hypertension Mother    Diabetes Mother    Heart disease Mother     Sleep apnea Mother    Hypertension Maternal Grandmother    Heart disease Maternal Grandmother    Sleep apnea Other    Past Surgical History:  Procedure Laterality Date   FOOT SURGERY  AGE 88   MEDIAN RECTUS REPAIR Left 12/16/2012   Procedure: MEDIAN RECTUS RESECTION LEFT EYE;  Surgeon: Corinda Gubler, MD;  Location: [facility];  Service: Ophthalmology;  Laterality: Left;   MUSCLE RECESSION AND RESECTION Left 12/16/2012   Procedure: LATERAL RECTUS RECESSION LEFT EYE;  Surgeon: Corinda Gubler, MD;  Location: [facility];  Service: Ophthalmology;  Laterality: Left;   TONSILLECTOMY AND ADENOIDECTOMY  AGE 52     Mardella Layman, MD 01/02/22 [phone]

## 2022-01-02 NOTE — Discharge Instructions (Addendum)
You may use over the counter AFRIN for the next 2-3 nights. This will help you breathe through your nose. Avoid prolonged/frequent use. Continue all allergy medicines you are taking.

## 2022-01-28 ENCOUNTER — Ambulatory Visit (INDEPENDENT_AMBULATORY_CARE_PROVIDER_SITE_OTHER): Payer: Medicare Other | Admitting: Nurse Practitioner

## 2022-01-28 ENCOUNTER — Other Ambulatory Visit (HOSPITAL_COMMUNITY)
Admission: RE | Admit: 2022-01-28 | Discharge: 2022-01-28 | Disposition: A | Discharge status: Home / Self Care | Payer: Medicare Other | Source: Ambulatory Visit | Attending: Nurse Practitioner | Admitting: Nurse Practitioner

## 2022-01-28 ENCOUNTER — Encounter: Payer: Self-pay | Admitting: Nurse Practitioner

## 2022-01-28 VITALS — BP 128/92 | HR 106 | Temp 98.6°F | Ht 67.0 in | Wt 357.2 lb

## 2022-01-28 DIAGNOSIS — L309 Dermatitis, unspecified: Secondary | ICD-10-CM

## 2022-01-28 DIAGNOSIS — Z6841 Body Mass Index (BMI) 40.0 and over, adult: Secondary | ICD-10-CM

## 2022-01-28 DIAGNOSIS — Z01419 Encounter for gynecological examination (general) (routine) without abnormal findings: Secondary | ICD-10-CM | POA: Diagnosis present

## 2022-01-28 DIAGNOSIS — E559 Vitamin D deficiency, unspecified: Secondary | ICD-10-CM

## 2022-01-28 DIAGNOSIS — Z113 Encounter for screening for infections with a predominantly sexual mode of transmission: Secondary | ICD-10-CM | POA: Insufficient documentation

## 2022-01-28 DIAGNOSIS — L308 Other specified dermatitis: Secondary | ICD-10-CM

## 2022-01-28 DIAGNOSIS — D508 Other iron deficiency anemias: Secondary | ICD-10-CM | POA: Diagnosis not present

## 2022-01-28 DIAGNOSIS — Z1151 Encounter for screening for human papillomavirus (HPV): Secondary | ICD-10-CM | POA: Diagnosis not present

## 2022-01-28 DIAGNOSIS — L299 Pruritus, unspecified: Secondary | ICD-10-CM | POA: Diagnosis not present

## 2022-01-28 DIAGNOSIS — Z124 Encounter for screening for malignant neoplasm of cervix: Secondary | ICD-10-CM

## 2022-01-28 DIAGNOSIS — Z3041 Encounter for surveillance of contraceptive pills: Secondary | ICD-10-CM

## 2022-01-28 DIAGNOSIS — Z23 Encounter for immunization: Secondary | ICD-10-CM

## 2022-01-28 MED ORDER — MONTELUKAST SODIUM 10 MG PO TABS: 10.0000 mg | ORAL_TABLET | Freq: Every day | ORAL | 2 refills | Status: DC

## 2022-01-28 MED ORDER — VITAMIN D (ERGOCALCIFEROL) 1.25 MG (50000 UNIT) PO CAPS: 50000.0000 [IU] | ORAL_CAPSULE | ORAL | 0 refills | Status: DC

## 2022-01-28 MED ORDER — NORGESTIM-ETH ESTRAD TRIPHASIC 0.18/0.215/0.25 MG-25 MCG PO TABS: 1.0000 | ORAL_TABLET | Freq: Every day | ORAL | 3 refills | Status: DC

## 2022-01-28 MED ORDER — FERROUS SULFATE 325 (65 FE) MG PO TBEC: 325.0000 mg | DELAYED_RELEASE_TABLET | Freq: Two times a day (BID) | ORAL | 3 refills | Status: DC

## 2022-01-28 MED ORDER — CLOBETASOL PROPIONATE 0.05 % EX CREA: 1.0000 | TOPICAL_CREAM | Freq: Two times a day (BID) | CUTANEOUS | 3 refills | Status: DC

## 2022-01-28 NOTE — Progress Notes (Signed)
I,Erika Frey,acting as a Neurosurgeon for [employer], FNP.,have documented all relevant documentation on the behalf of Erika Felts, FNP,as directed by  Erika Felts, FNP while in the presence of Erika Felts, FNP.  Subjective:     Patient ID: Erika Frey , female    DOB: 02/16/1996 , 26 y.o.   MRN: [id number]   Chief Complaint  Patient presents with   Gynecologic Exam    HPI  Patient presents today for pap smear.  She has been to an allergist as well for her eczema because she feels there is an itching underneath her skin. Reports being tested for foods in the past and tries to avoid foods that can cause her to itch.   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 Has been on disability her "whole life".    Gynecologic Exam The patient's pertinent negatives include no genital itching or pelvic pain. The problem has been unchanged.     Past Medical History:  Diagnosis Date   Absent menses    MOTHER STATES PT STARTED PERIOD AT AGE 89 BUT LAST PERIOD WAS 2 YRS AGO , AGE 54   Asthma    Eczema    Exotropia of left eye    Immunizations up to date    Simple constipation    OCCASIONAL   Sinus headache      Family History  Problem Relation Age of Onset   Hypertension Mother    Diabetes Mother    Heart disease Mother    Sleep apnea Mother    Hypertension Maternal Grandmother    Heart disease Maternal Grandmother    Sleep apnea Other      Current Outpatient Medications:    albuterol (PROVENTIL HFA;VENTOLIN HFA) 108 (90 BASE) MCG/ACT inhaler, Inhale 2 puffs into the lungs every 6 (six) hours as needed., Disp: , Rfl:    Ashwagandha 125 MG CAPS, Take 1 tablet by mouth at bedtime as needed., Disp: 30 capsule, Rfl: 3   cetirizine (ZYRTEC) 10 MG tablet, Take 1 tablet by mouth daily., Disp: , Rfl:    clobetasol cream (TEMOVATE) 0.05 %, Apply 1 Application topically 2 (two) times daily., Disp: 30 g, Rfl: 3   Ferric Maltol (ACCRUFER) 30 MG CAPS,  Take 1 tablet by mouth daily., Disp: 30 capsule, Rfl: 2   Fluocinolone Acetonide 0.01 % OIL, , Disp: , Rfl:    fluticasone (FLONASE) 50 MCG/ACT nasal spray, SHAKE LIQUID AND USE 2 SPRAYS IN EACH NOSTRIL DAILY, Disp: , Rfl:    ibuprofen (ADVIL) 600 MG tablet, Take 1 tablet (600 mg total) by mouth every 6 (six) hours as needed., Disp: 30 tablet, Rfl: 0   levocetirizine (XYZAL) 5 MG tablet, Take 5 mg by mouth daily., Disp: , Rfl:    Magnesium 250 MG TABS, Take 1 tablet (250 mg total) by mouth daily., Disp: 30 tablet, Rfl: 2   montelukast (SINGULAIR) 10 MG tablet, Take 1 tablet (10 mg total) by mouth at bedtime., Disp: 30 tablet, Rfl: 2   Norgestimate-Ethinyl Estradiol Triphasic (TRI-LO-SPRINTEC) 0.18/0.215/0.25 MG-25 MCG tab, Take 1 tablet by mouth daily., Disp: 74 tablet, Rfl: 3   Olopatadine HCl 0.6 % SOLN, Place 2 sprays into both nostrils 2 (two) times daily., Disp: , Rfl:    pseudoephedrine (SUDAFED) 60 MG tablet, Take 1 tablet (60 mg total) by mouth every 8 (eight) hours as needed for congestion., Disp: 15 tablet, Rfl: 0   tacrolimus (PROTOPIC) 0.1 % ointment,  APPLY TOPICALLY 2 TIMES A DAY TO LARGE BODY SURFACE AREA, Disp: 60 g, Rfl: 2   tiZANidine (ZANAFLEX) 4 MG tablet, Take 1 tablet (4 mg total) by mouth every 6 (six) hours as needed for muscle spasms., Disp: 30 tablet, Rfl: 0   Vitamin D, Ergocalciferol, (DRISDOL) 1.25 MG (50000 UNIT) CAPS capsule, Take 1 capsule (50,000 Units total) by mouth every 7 (seven) days., Disp: 12 capsule, Rfl: 0   Allergies  Allergen Reactions   Penicillin G     Other reaction(s): Unknown   Penicillins Other (See Comments)    Stomach cramps     Review of Systems  Constitutional: Negative.   Respiratory: Negative.    Cardiovascular: Negative.   Gastrointestinal: Negative.   Genitourinary:  Negative for pelvic pain.  Neurological: Negative.   Psychiatric/Behavioral: Negative.       Today's Vitals   01/28/22 1419  BP: (!) 128/92  Pulse: (!) 106   Temp: 98.6 F (37 C)  TempSrc: Oral  Weight: (!) 357 lb 3.2 oz (162 kg)  Height: 5\' 7"  (1.702 m)   Body mass index is 55.95 kg/m.  Wt Readings from Last 3 Encounters:  01/28/22 (!) 357 lb 3.2 oz (162 kg)  11/05/21 (!) 355 lb (161 kg)  09/11/21 (!) 358 lb (162.4 kg)    Objective:  Physical Exam Vitals reviewed. Exam conducted with a chaperone present.  Constitutional:      Appearance: Normal appearance. She is well-developed.  HENT:     Head: Normocephalic and atraumatic.  Eyes:     Pupils: Pupils are equal, round, and reactive to light.  Cardiovascular:     Rate and Rhythm: Normal rate and regular rhythm.     Pulses: Normal pulses.     Heart sounds: Normal heart sounds. No murmur heard. Pulmonary:     Effort: Pulmonary effort is normal. No respiratory distress.     Breath sounds: Normal breath sounds. No wheezing.  Genitourinary:    General: Normal vulva.     Exam position: Lithotomy position.     Tanner stage (genital): 5.     Labia:        Right: No rash.        Left: No rash.      Vagina: Normal. No vaginal discharge.     Cervix: Normal and dilated.     Uterus: Normal.      Adnexa: Right adnexa normal and left adnexa normal.     Rectum: Normal. Guaiac result negative.  Musculoskeletal:        General: Normal range of motion.  Skin:    General: Skin is warm and dry.     Capillary Refill: Capillary refill takes less than 2 seconds.  Neurological:     General: No focal deficit present.     Mental Status: She is alert and oriented to person, place, and time.     Cranial Nerves: No cranial nerve deficit.     Motor: No weakness.  Psychiatric:        Mood and Affect: Mood normal.        Behavior: Behavior normal.        Thought Content: Thought content normal.        Judgment: Judgment normal.         Assessment And Plan:     1. Eczema, unspecified type - Ambulatory referral to Dermatology - Ambulatory referral to Allergy - clobetasol cream (TEMOVATE)  0.05 %; Apply 1 Application topically 2 (two) times daily.  Dispense: 30 g; Refill: 3  2. Pruritus Comments: She is requesting a referral to allergy specialist. continue singulair.  - Ambulatory referral to Allergy  3. Vitamin D deficiency Will check vitamin D level and supplement as needed.    Also encouraged to spend 15 minutes in the sun daily.  - Vitamin D, Ergocalciferol, (DRISDOL) 1.25 MG (50000 UNIT) CAPS capsule; Take 1 capsule (50,000 Units total) by mouth every 7 (seven) days.  Dispense: 12 capsule; Refill: 0  4. Encounter for gynecological examination  5. Class 3 severe obesity due to excess calories without serious comorbidity with body mass index (BMI) of 50.0 to 59.9 in adult [facility])  She is encouraged to strive for BMI less than 30 to decrease cardiac risk. Advised to aim for at least 150 minutes of exercise per week.  6. Encounter for Papanicolaou smear of cervix - Cytology -Pap Smear  7. Screening for STD (sexually transmitted disease) - Cervicovaginal ancillary only  8. Iron deficiency anemia secondary to inadequate dietary iron intake Comments: She has been taking her iron supplements will recheck iron studies  - Iron, TIBC and Ferritin Panel  9. Surveillance for birth control, oral contraceptives - Norgestimate-Ethinyl Estradiol Triphasic (TRI-LO-SPRINTEC) 0.18/0.215/0.25 MG-25 MCG tab; Take 1 tablet by mouth daily.  Dispense: 74 tablet; Refill: 3    Patient was given opportunity to ask questions. Patient verbalized understanding of the plan and was able to repeat key elements of the plan. All questions were answered to their satisfaction.  Minette Brine, FNP   I, Minette Brine, FNP, have reviewed all documentation for this visit. The documentation on 01/28/22 for the exam, diagnosis, procedures, and orders are all accurate and complete.   IF YOU HAVE BEEN REFERRED TO A SPECIALIST, IT MAY TAKE 1-2 WEEKS TO SCHEDULE/PROCESS THE REFERRAL. IF YOU HAVE NOT HEARD FROM  US/SPECIALIST IN TWO WEEKS, PLEASE GIVE [place] A CALL AT [phone].   THE PATIENT IS ENCOURAGED TO PRACTICE SOCIAL DISTANCING DUE TO THE COVID-19 PANDEMIC.

## 2022-01-28 NOTE — Patient Instructions (Signed)
Pap Test Why am I having this test? A Pap test, also called a Pap smear, is a screening test to check for signs of: Infection. Cancer of the cervix. The cervix is the lower part of the uterus that opens into the vagina. Changes that may be a sign that cancer is developing (precancerous changes). Women need this test on a regular basis. In general, you should have a Pap test every 3 years until you reach menopause or age 27. Women aged 30-60 may choose to have their Pap test done at the same time as an HPV (human papillomavirus) test every 5 years (instead of every 3 years). Your health care provider may recommend having Pap tests more or less often depending on your medical conditions and past Pap test results. What is being tested? Cervical cells are tested for signs of infection or abnormalities. What kind of sample is taken?  Your health care provider will collect a sample of cells from the surface of your cervix. This will be done using a small cotton swab, plastic spatula, or brush that is inserted into your vagina using a tool called a speculum. This sample is often collected during a pelvic exam, when you are lying on your back on an exam table with your feet in footrests (stirrups). In some cases, fluids (secretions) from the cervix or vagina may also be collected. How do I prepare for this test? Be aware of where you are in your menstrual cycle. If you are menstruating on the day of the test, you may be asked to reschedule. You may need to reschedule if you have a known vaginal infection on the day of the test. Follow instructions from your health care provider about: Changing or stopping your regular medicines. Some medicines can cause abnormal test results, such as vaginal medicines and tetracycline. Avoiding douching 2-3 days before or the day of the test. Tell a health care provider about: Any allergies you have. All medicines you are taking, including vitamins, herbs, eye drops,  creams, and over-the-counter medicines. Any bleeding problems you have. Any surgeries you have had. Any medical conditions you have. Whether you are pregnant or may be pregnant. How are the results reported? Your test results will be reported as either abnormal or normal. What do the results mean? A normal test result means that you do not have signs of cancer of the cervix. An abnormal result may mean that you have: Cancer. A Pap test by itself is not enough to diagnose cancer. You will have more tests done if cancer is suspected. Precancerous changes in your cervix. Inflammation of the cervix. An STI (sexually transmitted infection). A fungal infection. A parasite infection. Talk with your health care provider about what your results mean. In some cases, your health care provider may do more testing to confirm the results. Questions to ask your health care provider Ask your health care provider, or the department that is doing the test: When will my results be ready? How will I get my results? What are my treatment options? What other tests do I need? What are my next steps? Summary In general, women should have a Pap test every 3 years until they reach menopause or age 27. Your health care provider will collect a sample of cells from the surface of your cervix. This will be done using a small cotton swab, plastic spatula, or brush. In some cases, fluids (secretions) from the cervix or vagina may also be collected. This information is not   intended to replace advice given to you by your health care provider. Make sure you discuss any questions you have with your health care provider. Document Revised: 07/21/2020 Document Reviewed: 07/21/2020 Elsevier Patient Education  2023 [employer]  

## 2022-01-29 LAB — IRON,TIBC AND FERRITIN PANEL
Ferritin: 52 ng/mL (ref 15–150)
Iron Saturation: 10 % — ABNORMAL LOW (ref 15–55)
Iron: 38 ug/dL (ref 27–159)
Total Iron Binding Capacity: 365 ug/dL (ref 250–450)
UIBC: 327 ug/dL (ref 131–425)

## 2022-01-30 ENCOUNTER — Other Ambulatory Visit: Payer: Self-pay | Admitting: Nurse Practitioner

## 2022-01-30 DIAGNOSIS — D508 Other iron deficiency anemias: Secondary | ICD-10-CM

## 2022-01-30 LAB — CERVICOVAGINAL ANCILLARY ONLY
Bacterial Vaginitis (gardnerella): POSITIVE — AB
Candida Glabrata: NEGATIVE
Candida Vaginitis: POSITIVE — AB
Chlamydia: NEGATIVE
Comment: NEGATIVE
Comment: NEGATIVE
Comment: NEGATIVE
Comment: NEGATIVE
Comment: NEGATIVE
Comment: NORMAL
Neisseria Gonorrhea: NEGATIVE
Trichomonas: NEGATIVE

## 2022-01-30 MED ORDER — ACCRUFER 30 MG PO CAPS: 1.0000 | ORAL_CAPSULE | Freq: Every day | ORAL | 2 refills | Status: DC

## 2022-01-30 NOTE — Progress Notes (Signed)
Okay I am going to send a rx for Accufer - she will need to call [phone] to give her info and to make sure she is interested. She will take once a day. Continue OTC until receive the new one.

## 2022-01-31 MED ORDER — METRONIDAZOLE 500 MG PO TABS: 500.0000 mg | ORAL_TABLET | Freq: Three times a day (TID) | ORAL | 0 refills | Status: AC

## 2022-01-31 MED ORDER — FLUCONAZOLE 100 MG PO TABS: 100.0000 mg | ORAL_TABLET | Freq: Every day | ORAL | 0 refills | Status: DC

## 2022-01-31 MED ORDER — ACCRUFER 30 MG PO CAPS: 1.0000 | ORAL_CAPSULE | Freq: Every day | ORAL | 5 refills | Status: DC

## 2022-02-01 ENCOUNTER — Other Ambulatory Visit: Payer: Self-pay

## 2022-02-01 MED ORDER — ACCRUFER 30 MG PO CAPS: 1.0000 | ORAL_CAPSULE | Freq: Every day | ORAL | 5 refills | Status: DC

## 2022-02-05 LAB — CYTOLOGY - PAP
Comment: NEGATIVE
Diagnosis: UNDETERMINED — AB
High risk HPV: NEGATIVE

## 2022-02-06 ENCOUNTER — Encounter (INDEPENDENT_AMBULATORY_CARE_PROVIDER_SITE_OTHER): Payer: Medicare Other | Admitting: Family Medicine

## 2022-02-20 ENCOUNTER — Ambulatory Visit: Payer: Medicare Other | Admitting: Plastic Surgery

## 2022-02-21 ENCOUNTER — Ambulatory Visit: Payer: Medicare Other | Admitting: Plastic Surgery

## 2022-02-22 ENCOUNTER — Ambulatory Visit: Payer: Medicare Other | Admitting: Surgical

## 2022-02-22 ENCOUNTER — Ambulatory Visit: Payer: Medicare Other | Admitting: Plastic Surgery

## 2022-02-27 ENCOUNTER — Ambulatory Visit: Payer: Medicare Other | Admitting: Nurse Practitioner

## 2022-02-27 ENCOUNTER — Ambulatory Visit: Payer: No Typology Code available for payment source | Admitting: Nurse Practitioner

## 2022-02-27 ENCOUNTER — Ambulatory Visit: Payer: Medicare Other

## 2022-02-28 ENCOUNTER — Ambulatory Visit (INDEPENDENT_AMBULATORY_CARE_PROVIDER_SITE_OTHER): Payer: Medicare Other

## 2022-02-28 VITALS — Ht 66.0 in | Wt 316.0 lb

## 2022-02-28 DIAGNOSIS — Z0000 Encounter for general adult medical examination without abnormal findings: Secondary | ICD-10-CM | POA: Diagnosis not present

## 2022-02-28 NOTE — Patient Instructions (Signed)
Erika Frey , Thank you for taking time to come for your Medicare Wellness Visit. I appreciate your ongoing commitment to your health goals. Please review the following plan we discussed and let me know if I can assist you in the future.   These are the goals we discussed:  Goals      Patient Stated     01/25/2021, wants to lose weight     Patient Stated     02/28/2022, wants to lose weight        This is a list of the screening recommended for you and due dates:  Health Maintenance  Topic Date Due   COVID-19 Vaccine (4 - Pfizer series) 03/31/2020   Flu Shot  Never done   Medicare Annual Wellness Visit  02/24/2022   Pap Smear  01/28/2025   Pap Smear  01/28/2025   Tetanus Vaccine  11/03/2030   HPV Vaccine  Completed   Hepatitis C Screening: USPSTF Recommendation to screen - Ages 18-79 yo.  Completed   HIV Screening  Completed    Advanced directives: Please bring a copy of your POA (Power of Attorney) and/or Living Will to your next appointment.   Conditions/risks identified: none  Next appointment: Follow up in one year for your annual wellness visit.   Preventive Care 65-60 Years Old, Female Preventive care refers to lifestyle choices and visits with your health care provider that can promote health and wellness. Preventive care visits are also called wellness exams. What can I expect for my preventive care visit? Counseling During your preventive care visit, your health care provider may ask about your: Medical history, including: Past medical problems. Family medical history. Pregnancy history. Current health, including: Menstrual cycle. Method of birth control. Emotional well-being. Home life and relationship well-being. Sexual activity and sexual health. Lifestyle, including: Alcohol, nicotine or tobacco, and drug use. Access to firearms. Diet, exercise, and sleep habits. Work and work Statistician. Sunscreen use. Safety issues such as seatbelt and bike helmet  use. Physical exam Your health care provider may check your: Height and weight. These may be used to calculate your BMI (body mass index). BMI is a measurement that tells if you are at a healthy weight. Waist circumference. This measures the distance around your waistline. This measurement also tells if you are at a healthy weight and may help predict your risk of certain diseases, such as type 2 diabetes and high blood pressure. Heart rate and blood pressure. Body temperature. Skin for abnormal spots. What immunizations do I need? Vaccines are usually given at various ages, according to a schedule. Your health care provider will recommend vaccines for you based on your age, medical history, and lifestyle or other factors, such as travel or where you work. What tests do I need? Screening Your health care provider may recommend screening tests for certain conditions. This may include: Pelvic exam and Pap test. Lipid and cholesterol levels. Diabetes screening. This is done by checking your blood sugar (glucose) after you have not eaten for a while (fasting). Hepatitis B test. Hepatitis C test. HIV (human immunodeficiency virus) test. STI (sexually transmitted infection) testing, if you are at risk. BRCA-related cancer screening. This may be done if you have a family history of breast, ovarian, tubal, or peritoneal cancers. Talk with your health care provider about your test results, treatment options, and if necessary, the need for more tests. Follow these instructions at home: Eating and drinking  Eat a healthy diet that includes fresh fruits and vegetables,  whole grains, lean protein, and low-fat dairy products. Take vitamin and mineral supplements as recommended by your health care provider. Do not drink alcohol if: Your health care provider tells you not to drink. You are pregnant, may be pregnant, or are planning to become pregnant. If you drink alcohol: Limit how much you have to  0-1 drink a day. Know how much alcohol is in your drink. In the [place] one drink equals one 12 oz bottle of beer (355 mL), one 5 oz glass of wine (148 mL), or one 1 oz glass of hard liquor (44 mL). Lifestyle Brush your teeth every morning and night with fluoride toothpaste. Floss one time each day. Exercise for at least 30 minutes 5 or more days each week. Do not use any products that contain nicotine or tobacco. These products include cigarettes, chewing tobacco, and vaping devices, such as e-cigarettes. If you need help quitting, ask your health care provider. Do not use drugs. If you are sexually active, practice safe sex. Use a condom or other form of protection to prevent STIs. If you do not wish to become pregnant, use a form of birth control. If you plan to become pregnant, see your health care provider for a prepregnancy visit. Find healthy ways to manage stress, such as: Meditation, yoga, or listening to music. Journaling. Talking to a trusted person. Spending time with friends and family. Minimize exposure to UV radiation to reduce your risk of skin cancer. Safety Always wear your seat belt while driving or riding in a vehicle. Do not drive: If you have been drinking alcohol. Do not ride with someone who has been drinking. If you have been using any mind-altering substances or drugs. While texting. When you are tired or distracted. Wear a helmet and other protective equipment during sports activities. If you have firearms in your house, make sure you follow all gun safety procedures. Seek help if you have been physically or sexually abused. What's next? Go to your health care provider once a year for an annual wellness visit. Ask your health care provider how often you should have your eyes and teeth checked. Stay up to date on all vaccines. This information is not intended to replace advice given to you by your health care provider. Make sure you discuss any questions you have  with your health care provider. Document Revised: 10/18/2020 Document Reviewed: 10/18/2020 Elsevier Patient Education  [place].

## 2022-02-28 NOTE — Progress Notes (Signed)
I connected with Erika Frey and Erika Frey today by telephone and verified that I am speaking with the correct person using two identifiers. Location patient: home Location provider: work Persons participating in the virtual visit: Erika Frey and Erika Muellner, Patriciann Becht LPN.   I discussed the limitations, risks, security and privacy concerns of performing an evaluation and management service by telephone and the availability of in person appointments. I also discussed with the patient that there may be a patient responsible charge related to this service. The patient expressed understanding and verbally consented to this telephonic visit.    Interactive audio and video telecommunications were attempted between this provider and patient, however failed, due to patient having technical difficulties OR patient did not have access to video capability.  We continued and completed visit with audio only.     Vital signs may be patient reported or missing.  Subjective:   Erika Frey is a 26 y.o. female who presents for Medicare Annual (Subsequent) preventive examination.  Review of Systems     Cardiac Risk Factors include: obesity (BMI >30kg/m2)     Objective:    Today's Vitals   02/28/22 1056  Weight: (!) 316 lb (143.3 kg)  Height: 5\' 6"  (1.676 m)   Body mass index is 51 kg/m.     02/28/2022   11:03 AM 01/25/2021    3:38 PM 01/02/2015    3:09 PM 01/10/2014    1:31 PM 12/16/2012    7:17 AM  Advanced Directives  Does Patient Have a Medical Advance Directive? Yes Yes No No Patient does not have advance directive;Patient would not like information  Type of Public librarianAdvance Directive Healthcare Power of [place];Living will Healthcare Power of [place];Living will     Copy of Healthcare Power of Attorney in Chart? No - copy requested No - copy requested     Would patient like information on creating a medical advance directive?   No - patient declined information No - patient declined information     Current  Medications (verified) Outpatient Encounter Medications as of 02/28/2022  Medication Sig   albuterol (PROVENTIL HFA;VENTOLIN HFA) 108 (90 BASE) MCG/ACT inhaler Inhale 2 puffs into the lungs every 6 (six) hours as needed.   cetirizine (ZYRTEC) 10 MG tablet Take 1 tablet by mouth daily.   clobetasol cream (TEMOVATE) 0.05 % Apply 1 Application topically 2 (two) times daily.   Ferric Maltol (ACCRUFER) 30 MG CAPS Take 1 tablet by mouth daily.   fluconazole (DIFLUCAN) 100 MG tablet Take 1 tablet (100 mg total) by mouth daily. Take 1 tablet by mouth now repeat in 5 days   Fluocinolone Acetonide 0.01 % OIL    fluticasone (FLONASE) 50 MCG/ACT nasal spray SHAKE LIQUID AND USE 2 SPRAYS IN EACH NOSTRIL DAILY   ibuprofen (ADVIL) 600 MG tablet Take 1 tablet (600 mg total) by mouth every 6 (six) hours as needed.   levocetirizine (XYZAL) 5 MG tablet Take 5 mg by mouth daily.   Magnesium 250 MG TABS Take 1 tablet (250 mg total) by mouth daily.   montelukast (SINGULAIR) 10 MG tablet Take 1 tablet (10 mg total) by mouth at bedtime.   Norgestimate-Ethinyl Estradiol Triphasic (TRI-LO-SPRINTEC) 0.18/0.215/0.25 MG-25 MCG tab Take 1 tablet by mouth daily.   Olopatadine HCl 0.6 % SOLN Place 2 sprays into both nostrils 2 (two) times daily.   pseudoephedrine (SUDAFED) 60 MG tablet Take 1 tablet (60 mg total) by mouth every 8 (eight) hours as needed for congestion.   tacrolimus (PROTOPIC) 0.1 % ointment APPLY TOPICALLY  2 TIMES A DAY TO LARGE BODY SURFACE AREA   tiZANidine (ZANAFLEX) 4 MG tablet Take 1 tablet (4 mg total) by mouth every 6 (six) hours as needed for muscle spasms.   Vitamin D, Ergocalciferol, (DRISDOL) 1.25 MG (50000 UNIT) CAPS capsule Take 1 capsule (50,000 Units total) by mouth every 7 (seven) days.   Ashwagandha 125 MG CAPS Take 1 tablet by mouth at bedtime as needed. (Patient not taking: Reported on 02/28/2022)   No facility-administered encounter medications on file as of 02/28/2022.    Allergies  (verified) Penicillin g and Penicillins   History: Past Medical History:  Diagnosis Date   Absent menses    MOTHER STATES PT STARTED PERIOD AT AGE 61 BUT LAST PERIOD WAS 2 YRS AGO , AGE 75   Asthma    Eczema    Exotropia of left eye    Immunizations up to date    Simple constipation    OCCASIONAL   Sinus headache    Past Surgical History:  Procedure Laterality Date   FOOT SURGERY  AGE 72   MEDIAN RECTUS REPAIR Left 12/16/2012   Procedure: MEDIAN RECTUS RESECTION LEFT EYE;  Surgeon: Corinda Gubler, MD;  Location: [facility];  Service: Ophthalmology;  Laterality: Left;   MUSCLE RECESSION AND RESECTION Left 12/16/2012   Procedure: LATERAL RECTUS RECESSION LEFT EYE;  Surgeon: Corinda Gubler, MD;  Location: [facility];  Service: Ophthalmology;  Laterality: Left;   TONSILLECTOMY AND ADENOIDECTOMY  AGE 68   Family History  Problem Relation Age of Onset   Hypertension Mother    Diabetes Mother    Heart disease Mother    Sleep apnea Mother    Hypertension Maternal Grandmother    Heart disease Maternal Grandmother    Sleep apnea Other    Social History   Socioeconomic History   Marital status: Single    Spouse name: Not on file   Number of children: Not on file   Years of education: Not on file   Highest education level: High school graduate  Occupational History   Occupation: self   Tobacco Use   Smoking status: Never   Smokeless tobacco: Never   Tobacco comments:    NO SMOKER IN HOME  Vaping Use   Vaping Use: Never used  Substance and Sexual Activity   Alcohol use: No    Alcohol/week: 0.0 standard drinks of alcohol   Drug use: No   Sexual activity: Yes    Birth control/protection: Pill  Other Topics Concern   Not on file  Social History Narrative   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;      Update 09/11/2021   Lives with her mother   Right handed   Caffeine: can't have caffeine due to heart palpitations    Social Determinants of Health    Financial Resource Strain: Low Risk  (02/28/2022)   Overall Financial Resource Strain (CARDIA)    Difficulty of Paying Living Expenses: Not hard at all  Food Insecurity: No Food Insecurity (02/28/2022)   Hunger Vital Sign    Worried About Running Out of Food in the Last Year: Never true    Ran Out of Food in the Last Year: Never true  Transportation Needs: No Transportation Needs (02/28/2022)   PRAPARE - Administrator, Civil Service (Medical): No    Lack of Transportation (Non-Medical): No  Physical Activity: Sufficiently Active (02/28/2022)   Exercise Vital Sign    Days of Exercise per Week:  3 days    Minutes of Exercise per Session: 120 min  Stress: No Stress Concern Present (02/28/2022)   [place]    Feeling of Stress : Not at all  Social Connections: Not on file    Tobacco Counseling Counseling given: Not Answered Tobacco comments: NO SMOKER IN HOME   Clinical Intake:  Pre-visit preparation completed: Yes  Pain : No/denies pain     Nutritional Status: BMI > 30  Obese Nutritional Risks: None Diabetes: No  How often do you need to have someone help you when you read instructions, pamphlets, or other written materials from your doctor or pharmacy?: 1 - Never What is the last grade level you completed in school?: 12th grade  Diabetic? no  Interpreter Needed?: No  Information entered by :: NAllen LPN   Activities of Daily Living    02/28/2022   11:04 AM  In your present state of health, do you have any difficulty performing the following activities:  Hearing? 0  Vision? 0  Difficulty concentrating or making decisions? 0  Walking or climbing stairs? 0  Dressing or bathing? 0  Doing errands, shopping? 0  Preparing Food and eating ? N  Using the Toilet? N  In the past six months, have you accidently leaked urine? N  Do you have problems with loss of bowel control? N  Managing  your Medications? N  Managing your Finances? N  Housekeeping or managing your Housekeeping? N    Patient Care Team: Minette Brine, FNP as PCP - General (General Practice) Warren Danes, PA-C as Physician Assistant (Dermatology)  Indicate any recent Medical Services you may have received from other than [facility] providers in the past year (date may be approximate).     Assessment:   This is a routine wellness examination for [place].  Hearing/Vision screen Vision Screening - Comments:: Regular eye exams, [facility]  Dietary issues and exercise activities discussed: Current Exercise Habits: Home exercise routine, Type of exercise: Other - see comments (cheer class), Time (Minutes): > 60, Frequency (Times/Week): 3, Weekly Exercise (Minutes/Week): 0   Goals Addressed             This Visit's Progress    Patient Stated       02/28/2022, wants to lose weight       Depression Screen    02/28/2022   11:04 AM 01/28/2022    2:17 PM 01/28/2022    2:07 PM 01/25/2021    3:40 PM 08/02/2020    2:20 PM  PHQ 2/9 Scores  PHQ - 2 Score 0 0 0 0 0    Fall Risk    02/28/2022   11:04 AM 01/28/2022    2:17 PM 01/28/2022    2:07 PM 01/25/2021    3:39 PM 08/02/2020    2:21 PM  Fall Risk   Falls in the past year? 0 0 0 0 0  Number falls in past yr: 0 0 0  0  Injury with Fall? 0 0 0    Risk for fall due to : Medication side effect No Fall Risks No Fall Risks Medication side effect   Follow up Falls prevention discussed;Education provided;Falls evaluation completed Falls evaluation completed Falls evaluation completed Falls evaluation completed;Education provided;Falls prevention discussed     FALL RISK PREVENTION PERTAINING TO THE HOME:  Any stairs in or around the home? No  If so, are there any without handrails? N/a Home free of loose throw  rugs in walkways, pet beds, electrical cords, etc? Yes  Adequate lighting in your home to reduce risk of falls? Yes   ASSISTIVE DEVICES  UTILIZED TO PREVENT FALLS:  Life alert? No  Use of a cane, walker or w/c? No  Grab bars in the bathroom? No  Shower chair or bench in shower? No  Elevated toilet seat or a handicapped toilet? No   TIMED UP AND GO:  Was the test performed? No .      Cognitive Function:        02/28/2022   11:05 AM 01/25/2021    3:41 PM  6CIT Screen  What Year? 0 points 0 points  What month? 0 points 0 points  What time? 0 points 0 points  Count back from 20 0 points 0 points  Months in reverse 0 points 0 points  Repeat phrase 0 points 2 points  Total Score 0 points 2 points    Immunizations Immunization History  Administered Date(s) Administered   HPV 9-valent 03/07/2008, 05/17/2008, 10/31/2008   PFIZER(Purple Top)SARS-COV-2 Vaccination 01/14/2020, 02/04/2020, 02/04/2020   Tdap 05/17/2008, 11/02/2020    TDAP status: Up to date  Flu Vaccine status: Up to date  Pneumococcal vaccine status: Up to date  Covid-19 vaccine status: Completed vaccines  Qualifies for Shingles Vaccine? No   Zostavax completed  n/a   Shingrix Completed?: n/a  Screening Tests Health Maintenance  Topic Date Due   COVID-19 Vaccine (4 - Pfizer series) 03/31/2020   INFLUENZA VACCINE  Never done   Medicare Annual Wellness (AWV)  02/24/2022   PAP-Cervical Cytology Screening  01/28/2025   PAP SMEAR-Modifier  01/28/2025   TETANUS/TDAP  11/03/2030   HPV VACCINES  Completed   Hepatitis C Screening  Completed   HIV Screening  Completed    Health Maintenance  Health Maintenance Due  Topic Date Due   COVID-19 Vaccine (4 - Pfizer series) 03/31/2020   INFLUENZA VACCINE  Never done   Medicare Annual Wellness (AWV)  02/24/2022    Colorectal cancer screening: n/a  Mammogram status: n/a  Bone Density status: n/a  Lung Cancer Screening: (Low Dose CT Chest recommended if Age 66-80 years, 30 pack-year currently smoking OR have quit w/in 15years.) does not qualify.   Lung Cancer Screening Referral:  no  Additional Screening:  Hepatitis C Screening: does qualify; Completed 11/02/2020  Vision Screening: Recommended annual ophthalmology exams for early detection of glaucoma and other disorders of the eye. Is the patient up to date with their annual eye exam?  Yes  Who is the provider or what is the name of the office in which the patient attends annual eye exams? [facility] If pt is not established with a provider, would they like to be referred to a provider to establish care? No .   Dental Screening: Recommended annual dental exams for proper oral hygiene  Community Resource Referral / Chronic Care Management: CRR required this visit?  No   CCM required this visit?  No      Plan:     I have personally reviewed and noted the following in the patient's chart:   Medical and social history Use of alcohol, tobacco or illicit drugs  Current medications and supplements including opioid prescriptions. Patient is not currently taking opioid prescriptions. Functional ability and status Nutritional status Physical activity Advanced directives List of other physicians Hospitalizations, surgeries, and ER visits in previous 12 months Vitals Screenings to include cognitive, depression, and falls Referrals and appointments  In addition,  I have reviewed and discussed with patient certain preventive protocols, quality metrics, and best practice recommendations. A written personalized care plan for preventive services as well as general preventive health recommendations were provided to patient.     Kellie Simmering, LPN   [id number]   Nurse Notes: none  Due to this being a virtual visit, the after visit summary with patients personalized plan was offered to patient via mail or my-chart. Patient would like to access on my-chart

## 2022-03-21 ENCOUNTER — Other Ambulatory Visit (HOSPITAL_COMMUNITY): Payer: Self-pay

## 2022-03-21 ENCOUNTER — Telehealth: Payer: Self-pay

## 2022-03-21 ENCOUNTER — Ambulatory Visit (INDEPENDENT_AMBULATORY_CARE_PROVIDER_SITE_OTHER): Payer: Medicare Other | Admitting: Allergy

## 2022-03-21 ENCOUNTER — Encounter: Payer: Self-pay | Admitting: Allergy

## 2022-03-21 ENCOUNTER — Other Ambulatory Visit: Payer: Self-pay

## 2022-03-21 VITALS — BP 126/82 | HR 97 | Temp 98.0°F | Resp 20 | Ht 67.0 in | Wt 359.1 lb

## 2022-03-21 DIAGNOSIS — L2084 Intrinsic (allergic) eczema: Secondary | ICD-10-CM | POA: Diagnosis not present

## 2022-03-21 DIAGNOSIS — H1013 Acute atopic conjunctivitis, bilateral: Secondary | ICD-10-CM

## 2022-03-21 DIAGNOSIS — J3089 Other allergic rhinitis: Secondary | ICD-10-CM | POA: Diagnosis not present

## 2022-03-21 DIAGNOSIS — J4520 Mild intermittent asthma, uncomplicated: Secondary | ICD-10-CM

## 2022-03-21 DIAGNOSIS — L309 Dermatitis, unspecified: Secondary | ICD-10-CM | POA: Diagnosis not present

## 2022-03-21 MED ORDER — OPZELURA 1.5 % EX CREA: TOPICAL_CREAM | CUTANEOUS | 5 refills | Status: AC

## 2022-03-21 MED ORDER — LEVOCETIRIZINE DIHYDROCHLORIDE 5 MG PO TABS: 5.0000 mg | ORAL_TABLET | Freq: Every evening | ORAL | 5 refills | Status: DC

## 2022-03-21 MED ORDER — CLOBETASOL PROPIONATE 0.05 % EX CREA: 1.0000 | TOPICAL_CREAM | Freq: Two times a day (BID) | CUTANEOUS | 3 refills | Status: DC

## 2022-03-21 MED ORDER — RYALTRIS 665-25 MCG/ACT NA SUSP: NASAL | 5 refills | Status: DC

## 2022-03-21 MED ORDER — MONTELUKAST SODIUM 10 MG PO TABS: 10.0000 mg | ORAL_TABLET | Freq: Every day | ORAL | 5 refills | Status: AC

## 2022-03-21 MED ORDER — FLUOCINOLONE ACETONIDE 0.01 % OT OIL: TOPICAL_OIL | OTIC | 5 refills | Status: DC

## 2022-03-21 MED ORDER — OLOPATADINE HCL 0.2 % OP SOLN: 1.0000 [drp] | Freq: Every day | OPHTHALMIC | 5 refills | Status: AC | PRN

## 2022-03-21 NOTE — Patient Instructions (Addendum)
-   Bathe and soak for 5-10 minutes in warm water once a day. Pat dry.  Immediately apply the below cream prescribed to flared areas (red, irritated, dry, itchy, patchy, scaly, flaky) only. Wait several minutes and then apply your moisturizer all over.    To affected areas on the face and neck, apply: Opzelura ointment twice a day as needed.  This is a non-steroid ointment safe to use anywhere on the body.  Be careful to avoid the eyes. To affected areas on the body (below the face and neck), apply: Clobetasol ointment twice a day as needed.  This is a strong steroid ointment Fluocinolone oil can be used on scalp and body With ointments be careful to avoid the armpits and groin area. - Make a note of any foods that make eczema worse. - Keep finger nails trimmed. - Wet-to-dry wrap therapy discussed and handout provided as way to help better hydrate the skin.  - Discussed Rinvoq oral therapy (daily pill) for add-on eczema control.  Benefits, risk and protocol discussed today.  Will get screening labs today and if normal we can proceed with Rinvoq.  Informational brochure provided.  Will have Tammy call you regarding ordering this medication.   - Continue to avoid tomato products as this worsens your eczema.  Will check IgE level for this  - Will obtain environmental allergy panel today with lab work - Continue with: Singulair (montelukast) 10mg  daily - Start taking: Xyzal (levocetirizine) 5mg  tablet once daily. Ryaltris (olopatadine/mometasone) two sprays per nostril 1-2 times daily as needed for congestion or drainage. Pataday (olopatadine) one drop per eye once daily as needed for itchy/watery eyes. - You can use an extra dose of the antihistamine, if needed, for breakthrough symptoms.  - Consider nasal saline rinses 1-2 times daily to remove allergens from the nasal cavities as well as help with mucous clearance (this is especially helpful to do before the nasal sprays are given)  - Have access  to albuterol inhaler 2 puffs every 4-6 hours as needed for cough/wheeze/shortness of breath/chest tightness.  May use 15-20 minutes prior to activity.   Monitor frequency of use.    Follow-up in 3 months or sooner if needed

## 2022-03-21 NOTE — Telephone Encounter (Signed)
PA request received for Opzelura 1.5% cream.  PA submitted through C[facility] to [facility].  PA is awaiting determination.  Key: [id number] - PA Case ID: [id number]

## 2022-03-21 NOTE — Progress Notes (Signed)
New Patient Note  RE: Erika Frey Kerstein MRN: [id number] DOB: 07/30/1995 Date of Office Visit: 03/21/2022  Primary care provider: [name], FNP  Chief Complaint: eczema  History of present illness: Erika Frey Nordin is a 26 y.o. female presenting today for evaluation of eczema. She presents today with her mother.    She has eczema and reports flares worse on her elbows, feet, knees but can be "all over".  She has struggled with eczema control all her life.  She states the topical therapies have not been very effective for her.  She has tried different prescription ointments/cream over the years and states nothing topical has really helped.  She currently has prescriptions for protopic, fluocinolone oil (never tried this), clobetasol.  She has seen dermatology however the one she was most recently seeing closed.  She has not identified another dermatologist.  She tried Dupixent and it caused pain in her arms, shoulder pain, lightheadedness and lumps in throat.  She states she received 3 doses of Dupixent before she stopped due to these symptoms.  She was then changed to Adbry and got the loading dose but states it made her feel nauseous and sick to her stomach after the first dose thus she did not continue.  Mother states the only thing that seems to help is a concoction she makes of coconut oil and tumeric.   She does not when she eats tomato sauce she itches more.  Chlorinated water makes her skin burn more.  She has never tried wet-to-dry wraps.  She has history of asthma from childhood.  She has an albuterol inhaler and uses it when she gets a cold/illness which per mother is about every 3 months or so.    She reports allergy symptoms of itchy/watery eyes, runny/stuffy nose, sneezing that is worse with outdoor exposure and worse in the spring.  She takes singulair daily and feels it doesn't work.  She has used olopatadine nasal spray but it taste bad and thus doesn't use.  She will use flonase  when needed but hasn't had it in a while to use.  She uses Zyrtec and doesn't feel like it help.  She has seen an allergist several years ago.  Mother states she had the scratch test and was told she was allergic to "just about everything".  She did not do immunotherapy at the time.  Review of systems: Review of Systems  Constitutional: Negative.   HENT: Negative.    Eyes: Negative.   Respiratory: Negative.    Cardiovascular: Negative.   Gastrointestinal: Negative.   Musculoskeletal: Negative.   Skin:  Positive for rash.  Allergic/Immunologic: Negative.   Neurological: Negative.     All other systems negative unless noted above in HPI  Past medical history: Past Medical History:  Diagnosis Date   Absent menses    MOTHER STATES PT STARTED PERIOD AT AGE 26 BUT LAST PERIOD WAS 2 YRS AGO , AGE 80   Asthma    Eczema    Exotropia of left eye    Immunizations up to date    Simple constipation    OCCASIONAL   Sinus headache    Urticaria     Past surgical history: Past Surgical History:  Procedure Laterality Date   FOOT SURGERY  AGE 5   MEDIAN RECTUS REPAIR Left 12/16/2012   Procedure: MEDIAN RECTUS RESECTION LEFT EYE;  Surgeon: Corinda Gubler, MD;  Location: [facility];  Service: Ophthalmology;  Laterality: Left;   MUSCLE RECESSION  AND RESECTION Left 12/16/2012   Procedure: LATERAL RECTUS RECESSION LEFT EYE;  Surgeon: Corinda Gubler, MD;  Location: [facility];  Service: Ophthalmology;  Laterality: Left;   TONSILLECTOMY AND ADENOIDECTOMY  AGE 54    Family history:  Family History  Problem Relation Age of Onset   Hypertension Mother    Diabetes Mother    Heart disease Mother    Sleep apnea Mother    Hypertension Maternal Grandmother    Heart disease Maternal Grandmother    Sleep apnea Other     Social history: Lives in an apartment without carpeting with electric heating and central cooling.  Dog in the home.  No concern for water  damage, mildew or roaches in the home.  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  She denies a smoking history   Medication List: Current Outpatient Medications  Medication Sig Dispense Refill   albuterol (PROVENTIL HFA;VENTOLIN HFA) 108 (90 BASE) MCG/ACT inhaler Inhale 2 puffs into the lungs every 6 (six) hours as needed.     cetirizine (ZYRTEC) 10 MG tablet Take 1 tablet by mouth daily.     Ferric Maltol (ACCRUFER) 30 MG CAPS Take 1 tablet by mouth daily. 30 capsule 5   fluticasone (FLONASE) 50 MCG/ACT nasal spray SHAKE LIQUID AND USE 2 SPRAYS IN EACH NOSTRIL DAILY     ibuprofen (ADVIL) 600 MG tablet Take 1 tablet (600 mg total) by mouth every 6 (six) hours as needed. 30 tablet 0   levocetirizine (XYZAL) 5 MG tablet Take 1 tablet (5 mg total) by mouth every evening. 30 tablet 5   Magnesium 250 MG TABS Take 1 tablet (250 mg total) by mouth daily. 30 tablet 2   Norgestimate-Ethinyl Estradiol Triphasic (TRI-LO-SPRINTEC) 0.18/0.215/0.25 MG-25 MCG tab Take 1 tablet by mouth daily. 74 tablet 3   Olopatadine HCl (PATADAY) 0.2 % SOLN Place 1 drop into both eyes daily as needed. 2.5 mL 5   Olopatadine HCl 0.6 % SOLN Place 2 sprays into both nostrils 2 (two) times daily.     Olopatadine-Mometasone (RYALTRIS) [id number] MCG/ACT SUSP two sprays per nostril 1-2 times daily as needed for congestion or drainage 29 g 5   Ruxolitinib Phosphate (OPZELURA) 1.5 % CREA Apply to affected areas twice a day as needed 60 g 5   tacrolimus (PROTOPIC) 0.1 % ointment APPLY TOPICALLY 2 TIMES A DAY TO LARGE BODY SURFACE AREA 60 g 2   Vitamin D, Ergocalciferol, (DRISDOL) 1.25 MG (50000 UNIT) CAPS capsule Take 1 capsule (50,000 Units total) by mouth every 7 (seven) days. 12 capsule 0   clobetasol cream (TEMOVATE) 0.05 % Apply 1 Application topically 2 (two) times daily. Apply to affected area twice a day as needed. Avoid face and neck 30 g 3   fluconazole (DIFLUCAN) 100 MG tablet Take 1 tablet (100 mg total) by mouth daily. Take  1 tablet by mouth now repeat in 5 days (Patient not taking: Reported on 03/21/2022) 2 tablet 0   Fluocinolone Acetonide 0.01 % OIL Apply to scalp and body as needed 20 mL 5   montelukast (SINGULAIR) 10 MG tablet Take 1 tablet (10 mg total) by mouth at bedtime. 30 tablet 5   No current facility-administered medications for this visit.    Known medication allergies: Allergies  Allergen Reactions   Penicillin G     Other reaction(s): Unknown   Penicillins Other (See Comments)    Stomach cramps     Physical examination: Blood pressure 126/82, pulse 97, temperature  98 F (36.7 C), resp. rate 20, height 5\' 7"  (1.702 m), weight (!) 359 lb 1.6 oz (162.9 kg), SpO2 100 %.  General: Alert, interactive, in no acute distress. HEENT: PERRLA, TMs pearly gray, turbinates moderately edematous with clear discharge, post-pharynx non erythematous. Neck: Supple without lymphadenopathy. Lungs: Clear to auscultation without wheezing, rhonchi or rales. {no increased work of breathing. CV: Normal S1, S2 without murmurs. Abdomen: Nondistended, nontender. Skin: Dry, hyperpigmented, thickened patches on the elbows bilaterally, popliteal fossa laterally, ankle area and top of feet . Extremities:  No clubbing, cyanosis or edema. Neuro:   Grossly intact.  Diagnositics/Labs:  Spirometry: FEV1: 2.45 L 79%, FVC: 2.98 L 82%, ratio consistent with nonobstructive pattern  Assessment and plan: Atopic dermatitis -not controlled  - Bathe and soak for 5-10 minutes in warm water once a day. Pat dry.  Immediately apply the below cream prescribed to flared areas (red, irritated, dry, itchy, patchy, scaly, flaky) only. Wait several minutes and then apply your moisturizer all over.    To affected areas on the face and neck, apply: Opzelura ointment twice a day as needed.  This is a non-steroid ointment safe to use anywhere on the body.  Be careful to avoid the eyes. To affected areas on the body (below the face and  neck), apply: Clobetasol ointment twice a day as needed.  This is a strong steroid ointment Fluocinolone oil can be used on scalp and body With ointments be careful to avoid the armpits and groin area. - Make a note of any foods that make eczema worse. - Keep finger nails trimmed. - Wet-to-dry wrap therapy discussed and handout provided as way to help better hydrate the skin.  - Discussed Rinvoq oral therapy (daily pill) for add-on eczema control.  Benefits, risk and protocol discussed today.  Will get screening labs today and if normal we can proceed with Rinvoq.  Informational brochure provided.  Will have Tammy call you regarding ordering this medication.   - Continue to avoid tomato products as this worsens your eczema.  Will check IgE level for this  Allergic rhinitis with conjunctivitis - Will obtain environmental allergy panel today with lab work - Continue with: Singulair (montelukast) 10mg  daily - Start taking: Xyzal (levocetirizine) 5mg  tablet once daily. Ryaltris (olopatadine/mometasone) two sprays per nostril 1-2 times daily as needed for congestion or drainage. Pataday (olopatadine) one drop per eye once daily as needed for itchy/watery eyes. - You can use an extra dose of the antihistamine, if needed, for breakthrough symptoms.  - Consider nasal saline rinses 1-2 times daily to remove allergens from the nasal cavities as well as help with mucous clearance (this is especially helpful to do before the nasal sprays are given)  Mild intermittent asthma - Have access to albuterol inhaler 2 puffs every 4-6 hours as needed for cough/wheeze/shortness of breath/chest tightness.  May use 15-20 minutes prior to activity.   Monitor frequency of use.    Follow-up in 3 months or sooner if needed  I appreciate the opportunity to take part in [address] care. Please do not hesitate to contact me with questions.  Sincerely,   , MD Allergy/Immunology [facility]

## 2022-03-24 LAB — QUANTIFERON-TB GOLD PLUS
QuantiFERON Mitogen Value: >10 IU/mL
QuantiFERON Nil Value: 0.12 IU/mL
QuantiFERON TB1 Ag Value: 0.08 IU/mL
QuantiFERON TB2 Ag Value: 0.12 IU/mL
QuantiFERON-TB Gold Plus: NEGATIVE

## 2022-03-24 LAB — CBC WITH DIFFERENTIAL
Basophils Absolute: 0 10*3/uL (ref 0.0–0.2)
Basos: 0 %
EOS (ABSOLUTE): 0.1 10*3/uL (ref 0.0–0.4)
Eos: 0 %
Hematocrit: 36.8 % (ref 34.0–46.6)
Hemoglobin: 11.8 g/dL (ref 11.1–15.9)
Immature Grans (Abs): 0.1 10*3/uL (ref 0.0–0.1)
Immature Granulocytes: 1 %
Lymphocytes Absolute: 2.3 10*3/uL (ref 0.7–3.1)
Lymphs: 19 %
MCH: 23.8 pg — ABNORMAL LOW (ref 26.6–33.0)
MCHC: 32.1 g/dL (ref 31.5–35.7)
MCV: 74 fL — ABNORMAL LOW (ref 79–97)
Monocytes Absolute: 0.8 10*3/uL (ref 0.1–0.9)
Monocytes: 7 %
Neutrophils Absolute: 8.6 10*3/uL — ABNORMAL HIGH (ref 1.4–7.0)
Neutrophils: 73 %
RBC: 4.96 x10E6/uL (ref 3.77–5.28)
RDW: 16.2 % — ABNORMAL HIGH (ref 11.7–15.4)
WBC: 11.8 10*3/uL — ABNORMAL HIGH (ref 3.4–10.8)

## 2022-03-24 LAB — COMPREHENSIVE METABOLIC PANEL
ALT: 17 IU/L (ref 0–32)
AST: 16 IU/L (ref 0–40)
Albumin/Globulin Ratio: 1.2 (ref 1.2–2.2)
Albumin: 4 g/dL (ref 4.0–5.0)
Alkaline Phosphatase: 59 IU/L (ref 44–121)
BUN/Creatinine Ratio: 10 (ref 9–23)
BUN: 7 mg/dL (ref 6–20)
Bilirubin Total: 0.6 mg/dL (ref 0.0–1.2)
CO2: 24 mmol/L (ref 20–29)
Calcium: 9.4 mg/dL (ref 8.7–10.2)
Chloride: 103 mmol/L (ref 96–106)
Creatinine, Ser: 0.73 mg/dL (ref 0.57–1.00)
Globulin, Total: 3.3 g/dL (ref 1.5–4.5)
Glucose: 104 mg/dL — ABNORMAL HIGH (ref 70–99)
Potassium: 4.1 mmol/L (ref 3.5–5.2)
Sodium: 140 mmol/L (ref 134–144)
Total Protein: 7.3 g/dL (ref 6.0–8.5)
eGFR: 116 mL/min/{1.73_m2} (ref >59)

## 2022-03-24 LAB — ALLERGENS W/TOTAL IGE AREA 2
Alternaria Alternata IgE: <0.1 kU/L
Aspergillus Fumigatus IgE: <0.1 kU/L
Bermuda Grass IgE: <0.1 kU/L
Cat Dander IgE: <0.1 kU/L
Cedar, Mountain IgE: <0.1 kU/L
Cladosporium Herbarum IgE: <0.1 kU/L
Cockroach, German IgE: <0.1 kU/L
Common Silver Birch IgE: <0.1 kU/L
Cottonwood IgE: <0.1 kU/L
D Farinae IgE: 0.4 kU/L — AB
D Pteronyssinus IgE: 0.93 kU/L — AB
Dog Dander IgE: 0.52 kU/L — AB
Elm, American IgE: <0.1 kU/L
IgE (Immunoglobulin E), Serum: 344 IU/mL (ref 6–495)
Johnson Grass IgE: <0.1 kU/L
Maple/Box Elder IgE: <0.1 kU/L
Mouse Urine IgE: <0.1 kU/L
Oak, White IgE: <0.1 kU/L
Pecan, Hickory IgE: <0.1 kU/L
Penicillium Chrysogen IgE: <0.1 kU/L
Pigweed, Rough IgE: <0.1 kU/L
Ragweed, Short IgE: <0.1 kU/L
Sheep Sorrel IgE Qn: <0.1 kU/L
Timothy Grass IgE: <0.1 kU/L
White Mulberry IgE: <0.1 kU/L

## 2022-03-24 LAB — LIPID PANEL
Chol/HDL Ratio: 3.9 ratio (ref 0.0–4.4)
Cholesterol, Total: 185 mg/dL (ref 100–199)
HDL: 48 mg/dL (ref >39)
LDL Chol Calc (NIH): 118 mg/dL — ABNORMAL HIGH (ref 0–99)
Triglycerides: 106 mg/dL (ref 0–149)
VLDL Cholesterol Cal: 19 mg/dL (ref 5–40)

## 2022-03-24 LAB — ALLERGEN, TOMATO F25: Allergen Tomato, IgE: <0.1 kU/L

## 2022-03-24 LAB — HEPATITIS B CORE AB W/REFLEX: Hep B Core Total Ab: NEGATIVE

## 2022-03-24 LAB — HEPATITIS B SURFACE ANTIGEN: Hepatitis B Surface Ag: NEGATIVE

## 2022-03-24 LAB — HEPATITIS C ANTIBODY: Hep C Virus Ab: NONREACTIVE

## 2022-03-25 NOTE — Telephone Encounter (Signed)
PA has been APPROVED through 05/06/2023.  Key: [id number] - PA Case ID: [id number]

## 2022-09-09 ENCOUNTER — Ambulatory Visit: Payer: 59 | Admitting: Podiatry

## 2022-09-10 ENCOUNTER — Ambulatory Visit: Payer: Self-pay | Admitting: Nurse Practitioner

## 2022-09-23 ENCOUNTER — Ambulatory Visit: Payer: 59 | Admitting: Nurse Practitioner

## 2022-09-23 ENCOUNTER — Telehealth: Payer: Self-pay | Admitting: Nurse Practitioner

## 2022-09-23 NOTE — Telephone Encounter (Signed)
Pt called  to cancel apt left VM called pt back to reschedule no answer left VM

## 2022-10-03 ENCOUNTER — Ambulatory Visit
Admission: EM | Admit: 2022-10-03 | Discharge: 2022-10-03 | Disposition: A | Discharge status: Home / Self Care | Payer: 59 | Attending: Family Medicine | Admitting: Family Medicine

## 2022-10-03 ENCOUNTER — Ambulatory Visit (HOSPITAL_COMMUNITY): Admission: EM | Admit: 2022-10-03 | Discharge: 2022-10-03 | Discharge status: Against Medical Advice | Payer: 59

## 2022-10-03 DIAGNOSIS — L239 Allergic contact dermatitis, unspecified cause: Secondary | ICD-10-CM | POA: Diagnosis not present

## 2022-10-03 MED ORDER — PREDNISONE 20 MG PO TABS: 40.0000 mg | ORAL_TABLET | Freq: Every day | ORAL | 0 refills | Status: AC

## 2022-10-03 NOTE — ED Provider Notes (Signed)
[facility]    CSN: [id number] Arrival date & time: 10/03/22  1631      History   Chief Complaint Chief Complaint  Patient presents with   Rash    HPI Erika Frey is a 27 y.o. female.    Rash  Here for rash that is been on her face since yesterday.  Yesterday her whole body was itching but now it is just her face it is bothering her and it is irritated and burning and itching.  No fever or cough or trouble breathing   Does have a history of atopic dermatitis and has triamcinolone at home already has also been taking some Benadryl  Past Medical History:  Diagnosis Date   Absent menses    MOTHER STATES PT STARTED PERIOD AT AGE 81 BUT LAST PERIOD WAS 2 YRS AGO , AGE 15   Asthma    Eczema    Exotropia of left eye    Immunizations up to date    Simple constipation    OCCASIONAL   Sinus headache    Urticaria     Patient Active Problem List   Diagnosis Date Noted   Exotropia of left eye 12/15/2012    Past Surgical History:  Procedure Laterality Date   FOOT SURGERY  AGE 74   MEDIAN RECTUS REPAIR Left 12/16/2012   Procedure: MEDIAN RECTUS RESECTION LEFT EYE;  Surgeon: Corinda Gubler, MD;  [facility];  Service: Ophthalmology;  Laterality: Left;   MUSCLE RECESSION AND RESECTION Left 12/16/2012   Procedure: LATERAL RECTUS RECESSION LEFT EYE;  Surgeon: Corinda Gubler, MD;  Location: [facility];  Service: Ophthalmology;  Laterality: Left;   TONSILLECTOMY AND ADENOIDECTOMY  AGE 56    OB History   No obstetric history on file.      Home Medications    Prior to Admission medications   Medication Sig Start Date End Date Taking? Authorizing Provider  albuterol (PROVENTIL HFA;VENTOLIN HFA) 108 (90 BASE) MCG/ACT inhaler Inhale 2 puffs into the lungs every 6 (six) hours as needed.   Yes [provider]  cetirizine (ZYRTEC) 10 MG tablet Take 1 tablet by mouth daily. 05/30/20  Yes [provider]   clobetasol cream (TEMOVATE) 0.05 % Apply 1 Application topically 2 (two) times daily. Apply to affected area twice a day as needed. Avoid face and neck 03/21/22  Yes Padgett, Pilar Grammes, MD  fluticasone [facility]) 50 MCG/ACT nasal spray SHAKE LIQUID AND USE 2 SPRAYS IN EACH NOSTRIL DAILY 12/14/20  Yes [provider]  montelukast (SINGULAIR) 10 MG tablet Take 1 tablet (10 mg total) by mouth at bedtime. 03/21/22  Yes Padgett, Pilar Grammes, MD  Norgestimate-Ethinyl Estradiol Triphasic (TRI-LO-SPRINTEC) 0.18/0.215/0.25 MG-25 MCG tab Take 1 tablet by mouth daily. 01/28/22  Yes [name], FNP  Olopatadine HCl (PATADAY) 0.2 % SOLN Place 1 drop into both eyes daily as needed. 03/21/22  Yes Padgett, Pilar Grammes, MD  Olopatadine HCl 0.6 % SOLN Place 2 sprays into both nostrils 2 (two) times daily. 09/21/20  Yes [provider]  Olopatadine-Mometasone [facility]) [phone] MCG/ACT SUSP two sprays per nostril 1-2 times daily as needed for congestion or drainage 03/21/22  Yes Padgett, Pilar Grammes, MD  predniSONE (DELTASONE) 20 MG tablet Take 2 tablets (40 mg total) by mouth daily with breakfast for 5 days. 10/03/22 10/08/22 Yes Zenia Resides, MD  Ruxolitinib Phosphate (OPZELURA) 1.5 % CREA Apply to affected areas twice a day as needed 03/21/22  Yes  Marcelyn Bruins, MD  tacrolimus (PROTOPIC) 0.1 % ointment APPLY TOPICALLY 2 TIMES A DAY TO LARGE BODY SURFACE AREA 09/03/21  Yes Sheffield, [place] R, PA-C  Vitamin D, Ergocalciferol, (DRISDOL) 1.25 MG (50000 UNIT) CAPS capsule Take 1 capsule (50,000 Units total) by mouth every 7 (seven) days. 01/28/22  Yes [name], FNP  Fluocinolone Acetonide 0.01 % OIL Apply to scalp and body as needed 03/21/22   Marcelyn Bruins, MD  levocetirizine (XYZAL) 5 MG tablet Take 1 tablet (5 mg total) by mouth every evening. 03/21/22   Marcelyn Bruins, MD    Family History Family History  Problem Relation Age of Onset    Hypertension Mother    Diabetes Mother    Heart disease Mother    Sleep apnea Mother    Hypertension Maternal Grandmother    Heart disease Maternal Grandmother    Sleep apnea Other     Social History Social History   Tobacco Use   Smoking status: Never   Smokeless tobacco: Never   Tobacco comments:    NO SMOKER IN HOME  Vaping Use   Vaping Use: Never used  Substance Use Topics   Alcohol use: No    Alcohol/week: 0.0 standard drinks of alcohol   Drug use: No     Allergies   Penicillin g and Penicillins   Review of Systems Review of Systems  Skin:  Positive for rash.     Physical Exam Triage Vital Signs ED Triage Vitals [10/03/22 1639]  Enc Vitals Group     BP 137/82     Pulse Rate 95     Resp 16     Temp 97.9 F (36.6 C)     Temp Source Oral     SpO2 94 %     Weight      Height      Head Circumference      Peak Flow      Pain Score 6     Pain Loc      Pain Edu?      Excl. in GC?    No data found.  Updated Vital Signs BP 137/82 (BP Location: Right Arm)   Pulse 95   Temp 97.9 F (36.6 C) (Oral)   Resp 16   LMP 09/26/2022 (Exact Date)   SpO2 94%   Visual Acuity Right Eye Distance:   Left Eye Distance:   Bilateral Distance:    Right Eye Near:   Left Eye Near:    Bilateral Near:     Physical Exam Vitals reviewed.  Constitutional:      General: She is not in acute distress.    Appearance: She is not ill-appearing, toxic-appearing or diaphoretic.  HENT:     Mouth/Throat:     Mouth: Mucous membranes are moist.  Eyes:     Extraocular Movements: Extraocular movements intact.     Conjunctiva/sclera: Conjunctivae normal.     Pupils: Pupils are equal, round, and reactive to light.  Cardiovascular:     Rate and Rhythm: Normal rate and regular rhythm.  Pulmonary:     Effort: Pulmonary effort is normal. No respiratory distress.     Breath sounds: Normal breath sounds. No stridor. No wheezing, rhonchi or rales.  Musculoskeletal:     Cervical  back: Neck supple.  Lymphadenopathy:     Cervical: No cervical adenopathy.  Skin:    Coloration: Skin is not jaundiced or pale.     Comments: There is a maculopapular rash on  her forehead cheeks nose and chin.  Her frontal area is maybe a little puffy.  Neurological:     Mental Status: She is alert and oriented to person, place, and time.  Psychiatric:        Behavior: Behavior normal.      UC Treatments / Results  Labs (all labs ordered are listed, but only abnormal results are displayed) Labs Reviewed - No data to display  EKG   Radiology No results found.  Procedures Procedures (including critical care time)  Medications Ordered in UC Medications - No data to display  Initial Impression / Assessment and Plan / UC Course  I have reviewed the triage vital signs and the nursing notes.  Pertinent labs & imaging results that were available during my care of the patient were reviewed by me and considered in my medical decision making (see chart for details).        Burst prednisone is sent in for the allergic reaction.-Continue using the triamcinolone, and I recommend either Benadryl or Zyrtec as needed for itching Final Clinical Impressions(s) / UC Diagnoses   Final diagnoses:  Allergic dermatitis     Discharge Instructions      Take prednisone 20 mg--2 daily for 5 days  You can take Benadryl or Zyrtec as needed for the itching and allergy.  Follow-up with your allergist concerning this issue     ED Prescriptions     Medication Sig Dispense Auth. Provider   predniSONE (DELTASONE) 20 MG tablet Take 2 tablets (40 mg total) by mouth daily with breakfast for 5 days. 10 tablet Marlinda Mike Janace Aris, MD      PDMP not reviewed this encounter.   Zenia Resides, MD 10/03/22 [phone]

## 2022-10-03 NOTE — ED Triage Notes (Signed)
Pt reports with a itchy rash on face that started yesterday. Pt reports it has not spread or gotten worse but has not went down since yesterday. Taking benadryl with no relief.

## 2022-10-03 NOTE — Discharge Instructions (Addendum)
Take prednisone 20 mg--2 daily for 5 days  You can take Benadryl or Zyrtec as needed for the itching and allergy.  Follow-up with your allergist concerning this issue

## 2022-11-03 ENCOUNTER — Encounter (HOSPITAL_COMMUNITY): Payer: Self-pay | Admitting: Emergency Medicine

## 2022-11-03 ENCOUNTER — Emergency Department (HOSPITAL_COMMUNITY)
Admission: EM | Admit: 2022-11-03 | Discharge: 2022-11-03 | Disposition: A | Discharge status: Home / Self Care | Payer: 59 | Source: Home / Self Care | Attending: Emergency Medicine | Admitting: Emergency Medicine

## 2022-11-03 ENCOUNTER — Other Ambulatory Visit: Payer: Self-pay

## 2022-11-03 DIAGNOSIS — E1110 Type 2 diabetes mellitus with ketoacidosis without coma: Secondary | ICD-10-CM | POA: Diagnosis not present

## 2022-11-03 DIAGNOSIS — E119 Type 2 diabetes mellitus without complications: Secondary | ICD-10-CM

## 2022-11-03 DIAGNOSIS — Z794 Long term (current) use of insulin: Secondary | ICD-10-CM | POA: Diagnosis not present

## 2022-11-03 DIAGNOSIS — J45909 Unspecified asthma, uncomplicated: Secondary | ICD-10-CM | POA: Insufficient documentation

## 2022-11-03 DIAGNOSIS — E860 Dehydration: Secondary | ICD-10-CM | POA: Diagnosis not present

## 2022-11-03 DIAGNOSIS — E1165 Type 2 diabetes mellitus with hyperglycemia: Secondary | ICD-10-CM | POA: Insufficient documentation

## 2022-11-03 DIAGNOSIS — Z7985 Long-term (current) use of injectable non-insulin antidiabetic drugs: Secondary | ICD-10-CM | POA: Diagnosis not present

## 2022-11-03 DIAGNOSIS — R112 Nausea with vomiting, unspecified: Secondary | ICD-10-CM | POA: Diagnosis not present

## 2022-11-03 DIAGNOSIS — Z880 Allergy status to penicillin: Secondary | ICD-10-CM | POA: Diagnosis not present

## 2022-11-03 DIAGNOSIS — Z79899 Other long term (current) drug therapy: Secondary | ICD-10-CM | POA: Diagnosis not present

## 2022-11-03 DIAGNOSIS — R000 Tachycardia, unspecified: Secondary | ICD-10-CM | POA: Diagnosis not present

## 2022-11-03 DIAGNOSIS — Z833 Family history of diabetes mellitus: Secondary | ICD-10-CM | POA: Diagnosis not present

## 2022-11-03 DIAGNOSIS — Z7984 Long term (current) use of oral hypoglycemic drugs: Secondary | ICD-10-CM | POA: Insufficient documentation

## 2022-11-03 DIAGNOSIS — Z6841 Body Mass Index (BMI) 40.0 and over, adult: Secondary | ICD-10-CM | POA: Diagnosis not present

## 2022-11-03 DIAGNOSIS — Z8249 Family history of ischemic heart disease and other diseases of the circulatory system: Secondary | ICD-10-CM | POA: Diagnosis not present

## 2022-11-03 LAB — I-STAT VENOUS BLOOD GAS, ED
Acid-Base Excess: 0 mmol/L (ref 0.0–2.0)
Bicarbonate: 24.6 mmol/L (ref 20.0–28.0)
Calcium, Ion: 1.14 mmol/L — ABNORMAL LOW (ref 1.15–1.40)
HCT: 43 % (ref 36.0–46.0)
Hemoglobin: 14.6 g/dL (ref 12.0–15.0)
O2 Saturation: 88 %
Potassium: 5 mmol/L (ref 3.5–5.1)
Sodium: 132 mmol/L — ABNORMAL LOW (ref 135–145)
TCO2: 26 mmol/L (ref 22–32)
pCO2, Ven: 38.5 mmHg — ABNORMAL LOW (ref 44–60)
pH, Ven: 7.413 (ref 7.25–7.43)
pO2, Ven: 53 mmHg — ABNORMAL HIGH (ref 32–45)

## 2022-11-03 LAB — URINALYSIS, ROUTINE W REFLEX MICROSCOPIC
Bacteria, UA: NONE SEEN
Bilirubin Urine: NEGATIVE
Glucose, UA: >=500 mg/dL — AB
Ketones, ur: 20 mg/dL — AB
Leukocytes,Ua: NEGATIVE
Nitrite: NEGATIVE
Protein, ur: NEGATIVE mg/dL
Specific Gravity, Urine: 1.024 (ref 1.005–1.030)
pH: 5 (ref 5.0–8.0)

## 2022-11-03 LAB — CBC
HCT: 41.4 % (ref 36.0–46.0)
Hemoglobin: 13.8 g/dL (ref 12.0–15.0)
MCH: 24.9 pg — ABNORMAL LOW (ref 26.0–34.0)
MCHC: 33.3 g/dL (ref 30.0–36.0)
MCV: 74.7 fL — ABNORMAL LOW (ref 80.0–100.0)
Platelets: 379 10*3/uL (ref 150–400)
RBC: 5.54 MIL/uL — ABNORMAL HIGH (ref 3.87–5.11)
RDW: 15.9 % — ABNORMAL HIGH (ref 11.5–15.5)
WBC: 14.1 10*3/uL — ABNORMAL HIGH (ref 4.0–10.5)
nRBC: 0 % (ref 0.0–0.2)

## 2022-11-03 LAB — I-STAT CHEM 8, ED
BUN: 10 mg/dL (ref 6–20)
Calcium, Ion: 1.17 mmol/L (ref 1.15–1.40)
Chloride: 97 mmol/L — ABNORMAL LOW (ref 98–111)
Creatinine, Ser: 0.8 mg/dL (ref 0.44–1.00)
Glucose, Bld: 519 mg/dL (ref 70–99)
HCT: 45 % (ref 36.0–46.0)
Hemoglobin: 15.3 g/dL — ABNORMAL HIGH (ref 12.0–15.0)
Potassium: 4.2 mmol/L (ref 3.5–5.1)
Sodium: 133 mmol/L — ABNORMAL LOW (ref 135–145)
TCO2: 27 mmol/L (ref 22–32)

## 2022-11-03 LAB — BASIC METABOLIC PANEL
Anion gap: 14 (ref 5–15)
BUN: 10 mg/dL (ref 6–20)
CO2: 22 mmol/L (ref 22–32)
Calcium: 9.4 mg/dL (ref 8.9–10.3)
Chloride: 94 mmol/L — ABNORMAL LOW (ref 98–111)
Creatinine, Ser: 0.91 mg/dL (ref 0.44–1.00)
GFR, Estimated: >60 mL/min (ref >60)
Glucose, Bld: 528 mg/dL (ref 70–99)
Potassium: 4 mmol/L (ref 3.5–5.1)
Sodium: 130 mmol/L — ABNORMAL LOW (ref 135–145)

## 2022-11-03 LAB — CBG MONITORING, ED
Glucose-Capillary: 502 mg/dL (ref 70–99)
Glucose-Capillary: 376 mg/dL — ABNORMAL HIGH (ref 70–99)
Glucose-Capillary: 376 mg/dL — ABNORMAL HIGH (ref 70–99)
Glucose-Capillary: 341 mg/dL — ABNORMAL HIGH (ref 70–99)

## 2022-11-03 LAB — HCG, SERUM, QUALITATIVE: Preg, Serum: NEGATIVE

## 2022-11-03 LAB — BETA-HYDROXYBUTYRIC ACID: Beta-Hydroxybutyric Acid: 2.59 mmol/L — ABNORMAL HIGH (ref 0.05–0.27)

## 2022-11-03 MED ORDER — LACTATED RINGERS IV BOLUS
1000.0000 mL | Freq: Once | INTRAVENOUS | Status: AC
  Administered 2022-11-03: 1000 mL via INTRAVENOUS

## 2022-11-03 MED ORDER — INSULIN ASPART 100 UNIT/ML IJ SOLN
10.0000 [IU] | Freq: Once | INTRAMUSCULAR | Status: AC
  Administered 2022-11-03: 10 [IU] via INTRAVENOUS

## 2022-11-03 MED ORDER — LANCET DEVICE MISC: 1.0000 | Freq: Three times a day (TID) | 0 refills | Status: AC

## 2022-11-03 MED ORDER — BLOOD GLUCOSE TEST VI STRP: 1.0000 | ORAL_STRIP | Freq: Three times a day (TID) | 0 refills | Status: DC

## 2022-11-03 MED ORDER — METFORMIN HCL 500 MG PO TABS: 500.0000 mg | ORAL_TABLET | Freq: Two times a day (BID) | ORAL | 1 refills | Status: DC

## 2022-11-03 MED ORDER — INSULIN ASPART 100 UNIT/ML IJ SOLN
5.0000 [IU] | Freq: Once | INTRAMUSCULAR | Status: AC
  Administered 2022-11-03: 5 [IU] via INTRAVENOUS

## 2022-11-03 MED ORDER — LANCETS MISC. MISC: 1.0000 | Freq: Three times a day (TID) | 0 refills | Status: AC

## 2022-11-03 MED ORDER — BLOOD GLUCOSE MONITORING SUPPL DEVI: 1.0000 | Freq: Three times a day (TID) | 0 refills | Status: AC

## 2022-11-03 NOTE — ED Triage Notes (Signed)
Pt arrives via POV concern for high blood sugars. Endorses increased thirst and frequent urination for the past two weeks. No burning with urination. No Dx of DM. Checked her CBG with mothers glucometer.

## 2022-11-03 NOTE — ED Provider Notes (Signed)
[place] EMERGENCY DEPARTMENT AT [facility] Provider Note   CSN: [id number] Arrival date & time: 11/03/22  0105     History {Add pertinent medical, surgical, social history, OB history to HPI:1} Chief Complaint  Patient presents with   Hyperglycemia    Erika Frey is a 27 y.o. female.  27 year old female with a history of asthma, eczema presents to the emergency department for complaints of polyuria and polydipsia.  She has been experiencing symptoms for the past 2 weeks.  She initially thought that her frequent urination was related to increased fluid intake, though she began to have increased fatigue and muscle cramping today.  Her mother checked the patient's blood sugar at home with her own glucometer with a reading of 499.  The patient has no known history of diabetes.  No recent fevers or suspected viral illness.  Has not been on any new medications or steroids.  No associated nausea or vomiting.  The history is provided by the patient and a parent. No language interpreter was used.  Hyperglycemia      Home Medications Prior to Admission medications   Medication Sig Start Date End Date Taking? Authorizing Provider  Blood Glucose Monitoring Suppl DEVI 1 each by Does not apply route in the morning, at noon, and at bedtime. May substitute to any manufacturer covered by patient's insurance. 11/03/22  Yes Antony Madura, PA-C  Glucose Blood (BLOOD GLUCOSE TEST STRIPS) STRP 1 each by In Vitro route in the morning, at noon, and at bedtime. May substitute to any manufacturer covered by patient's insurance. 11/03/22 12/03/22 Yes Antony Madura, PA-C  Lancet Device MISC 1 each by Does not apply route in the morning, at noon, and at bedtime. May substitute to any manufacturer covered by patient's insurance. 11/03/22 12/03/22 Yes Antony Madura, PA-C  Lancets Misc. MISC 1 each by Does not apply route in the morning, at noon, and at bedtime. May substitute to any manufacturer covered by  patient's insurance. 11/03/22 12/03/22 Yes Antony Madura, PA-C  metFORMIN (GLUCOPHAGE) 500 MG tablet Take 1 tablet (500 mg total) by mouth 2 (two) times daily with a meal. 11/03/22  Yes Antony Madura, PA-C  albuterol (PROVENTIL HFA;VENTOLIN HFA) 108 (90 BASE) MCG/ACT inhaler Inhale 2 puffs into the lungs every 6 (six) hours as needed.    [provider]  cetirizine (ZYRTEC) 10 MG tablet Take 1 tablet by mouth daily. 05/30/20   [provider]  clobetasol cream (TEMOVATE) 0.05 % Apply 1 Application topically 2 (two) times daily. Apply to affected area twice a day as needed. Avoid face and neck 03/21/22   Marcelyn Bruins, MD  Fluocinolone Acetonide 0.01 % OIL Apply to scalp and body as needed 03/21/22   Marcelyn Bruins, MD  fluticasone [facility]) 50 MCG/ACT nasal spray SHAKE LIQUID AND USE 2 SPRAYS IN [facility] NOSTRIL DAILY 12/14/20   [provider]  levocetirizine (XYZAL) 5 MG tablet Take 1 tablet (5 mg total) by mouth every evening. 03/21/22   Marcelyn Bruins, MD  montelukast (SINGULAIR) 10 MG tablet Take 1 tablet (10 mg total) by mouth at bedtime. 03/21/22   Marcelyn Bruins, MD  Norgestimate-Ethinyl Estradiol Triphasic (TRI-LO-SPRINTEC) 0.18/0.215/0.25 MG-25 MCG tab Take 1 tablet by mouth daily. 01/28/22   [name], FNP  Olopatadine HCl (PATADAY) 0.2 % SOLN Place 1 drop into both eyes daily as needed. 03/21/22   Marcelyn Bruins, MD  Olopatadine HCl 0.6 % SOLN Place 2 sprays into both nostrils 2 (two) times  daily. 09/21/20   [provider]  Olopatadine-Mometasone [facility]) [phone] MCG/ACT SUSP two sprays per nostril 1-2 times daily as needed for congestion or drainage 03/21/22   Marcelyn Bruins, MD  Ruxolitinib Phosphate (OPZELURA) 1.5 % CREA Apply to affected areas twice a day as needed 03/21/22   Marcelyn Bruins, MD  tacrolimus (PROTOPIC) 0.1 % ointment APPLY TOPICALLY 2 TIMES A DAY TO LARGE BODY  SURFACE AREA 09/03/21   Sheffield, Baker R, PA-C  Vitamin D, Ergocalciferol, (DRISDOL) 1.25 MG (50000 UNIT) CAPS capsule Take 1 capsule (50,000 Units total) by mouth every 7 (seven) days. 01/28/22   [name], FNP      Allergies    Penicillin g and Penicillins    Review of Systems   Review of Systems Ten systems reviewed and are negative for acute change, except as noted in the HPI.    Physical Exam Updated Vital Signs BP 133/60 (BP Location: Right Arm)   Pulse 94   Temp 97.8 F (36.6 C) (Oral)   Resp 19   Ht 5\' 7"  (1.702 m)   Wt (!) 144.7 kg   LMP 10/22/2022   SpO2 98%   BMI 49.96 kg/m   Physical Exam Vitals and nursing note reviewed.  Constitutional:      General: She is not in acute distress.    Appearance: She is well-developed. She is not diaphoretic.     Comments: Nontoxic-appearing and in no acute distress. Obese AA female.  HENT:     Head: Normocephalic and atraumatic.     Mouth/Throat:     Mouth: Mucous membranes are dry.  Eyes:     General: No scleral icterus.    Conjunctiva/sclera: Conjunctivae normal.  Cardiovascular:     Rate and Rhythm: Regular rhythm. Tachycardia present.     Pulses: Normal pulses.  Pulmonary:     Effort: Pulmonary effort is normal. No respiratory distress.     Comments: Respirations even and unlabored Musculoskeletal:        General: Normal range of motion.     Cervical back: Normal range of motion.  Skin:    General: Skin is warm and dry.     Coloration: Skin is not pale.     Findings: No erythema or rash.  Neurological:     Mental Status: She is alert and oriented to person, place, and time.     Coordination: Coordination normal.  Psychiatric:        Mood and Affect: Mood is depressed.        Behavior: Behavior is cooperative.     ED Results / Procedures / Treatments   Labs (all labs ordered are listed, but only abnormal results are displayed) Labs Reviewed  BASIC METABOLIC PANEL - Abnormal; Notable for the following  components:      Result Value   Sodium 130 (*)    Chloride 94 (*)    Glucose, Bld 528 (*)    All other components within normal limits  CBC - Abnormal; Notable for the following components:   WBC 14.1 (*)    RBC 5.54 (*)    MCV 74.7 (*)    MCH 24.9 (*)    RDW 15.9 (*)    All other components within normal limits  URINALYSIS, ROUTINE W REFLEX MICROSCOPIC - Abnormal; Notable for the following components:   Color, Urine STRAW (*)    Glucose, UA >=500 (*)    Hgb urine dipstick SMALL (*)    Ketones, ur 20 (*)  All other components within normal limits  BETA-HYDROXYBUTYRIC ACID - Abnormal; Notable for the following components:   Beta-Hydroxybutyric Acid 2.59 (*)    All other components within normal limits  CBG MONITORING, ED - Abnormal; Notable for the following components:   Glucose-Capillary 502 (*)    All other components within normal limits  CBG MONITORING, ED - Abnormal; Notable for the following components:   Glucose-Capillary 376 (*)    All other components within normal limits  I-STAT CHEM 8, ED - Abnormal; Notable for the following components:   Sodium 133 (*)    Chloride 97 (*)    Glucose, Bld 519 (*)    Hemoglobin 15.3 (*)    All other components within normal limits  I-STAT VENOUS BLOOD GAS, ED - Abnormal; Notable for the following components:   pCO2, Ven 38.5 (*)    pO2, Ven 53 (*)    Sodium 132 (*)    Calcium, Ion 1.14 (*)    All other components within normal limits  CBG MONITORING, ED - Abnormal; Notable for the following components:   Glucose-Capillary 376 (*)    All other components within normal limits  CBG MONITORING, ED - Abnormal; Notable for the following components:   Glucose-Capillary 341 (*)    All other components within normal limits  HCG, SERUM, QUALITATIVE  HEMOGLOBIN A1C    EKG None  Radiology No results found.  Procedures Procedures  {Document cardiac monitor, telemetry assessment procedure when appropriate:1}  Medications  Ordered in ED Medications  lactated ringers bolus 1,000 mL (0 mLs Intravenous Stopped 11/03/22 0440)  insulin aspart (novoLOG) injection 10 Units (10 Units Intravenous Given 11/03/22 0352)  lactated ringers bolus 1,000 mL (0 mLs Intravenous Stopped 11/03/22 0612)  insulin aspart (novoLOG) injection 5 Units (5 Units Intravenous Given 11/03/22 0557)    ED Course/ Medical Decision Making/ A&P Clinical Course as of 11/03/22 0657  Sun Nov 03, 2022  0505 Repeat CBG 376 [KH]  0511 Starting second liter bolus. Insulin given 1 hour ago. Will hold additional insulin until further CBG trending is completed. [KH]  0511 Beta hydroxybutyrate is mildly elevated, but less than 3.  She has a normal pH on venous blood gas.  No significant anion gap.  Evaluation today not representative of DKA. [KH]  0657 Repeat CBG 341 [KH]    Clinical Course User Index [KH] Antony Madura, PA-C   {   Click here for ABCD2, HEART and other calculatorsREFRESH Note before signing :1}                          Medical Decision Making Amount and/or Complexity of Data Reviewed Labs: ordered.  Risk OTC drugs. Prescription drug management.   This patient presents to the ED for concern of ***, this involves an extensive number of treatment options, and is a complaint that carries with it a high risk of complications and morbidity.  The differential diagnosis includes ***   Co morbidities that complicate the patient evaluation  ***   Additional history obtained:  Additional history obtained from *** External records from outside source obtained and reviewed including ***   Lab Tests:  I Ordered, and personally interpreted labs.  The pertinent results include:  ***   Imaging Studies ordered:  I ordered imaging studies including ***  I independently visualized and interpreted imaging which showed *** I agree with the radiologist interpretation   Cardiac Monitoring:  The patient was maintained on a cardiac  monitor.  I personally viewed and interpreted the cardiac monitored which showed an underlying rhythm of: ***   Medicines ordered and prescription drug management:  I ordered medication including ***  for ***  Reevaluation of the patient after these medicines showed that the patient {resolved/improved/worsened:23923::"improved"} I have reviewed the patients home medicines and have made adjustments as needed   Test Considered:  ***   Critical Interventions:  ***   Consultations Obtained:  I requested consultation with the ***,  and discussed lab and imaging findings as well as pertinent plan - they recommend: ***   Problem List / ED Course:  ***   Reevaluation:  After the interventions noted above, I reevaluated the patient and found that they have :{resolved/improved/worsened:23923::"improved"}   Social Determinants of Health:  ***   Dispostion:  After consideration of the diagnostic results and the patients response to treatment, I feel that the patent would benefit from ***.    Vitals:   11/03/22 0315 11/03/22 0559 11/03/22 0622 11/03/22 0645  BP: 128/86  133/60   Pulse: (!) 111  94 94  Resp: 18  19 19   Temp:  97.8 F (36.6 C)    TempSrc:  Oral    SpO2: 95%  99% 98%  Weight:      Height:        {Document critical care time when appropriate:1} {Document review of labs and clinical decision tools ie heart score, Chads2Vasc2 etc:1}  {Document your independent review of radiology images, and any outside records:1} {Document your discussion with family members, caretakers, and with consultants:1} {Document social determinants of health affecting pt's care:1} {Document your decision making why or why not admission, treatments were needed:1} Final Clinical Impression(s) / ED Diagnoses Final diagnoses:  Diabetes mellitus, new onset (HCC)    Rx / DC Orders ED Discharge Orders          Ordered    metFORMIN (GLUCOPHAGE) 500 MG tablet  2 times daily with  meals        11/03/22 0647    Blood Glucose Monitoring Suppl DEVI  3 times daily        11/03/22 0649    Glucose Blood (BLOOD GLUCOSE TEST STRIPS) STRP  3 times daily        11/03/22 0649    Lancet Device MISC  3 times daily        11/03/22 0649    Lancets Misc. MISC  3 times daily        11/03/22 [phone]

## 2022-11-03 NOTE — Discharge Instructions (Addendum)
Follow-up with your primary care doctor for recheck of your blood sugar levels.  Take metformin as prescribed until able to be reassessed by your primary doctor.  Check your blood sugar ~3 times per day. Continue to remain well-hydrated by drinking plenty of water.  Return to the ED for new or concerning symptoms.

## 2022-11-04 ENCOUNTER — Ambulatory Visit (INDEPENDENT_AMBULATORY_CARE_PROVIDER_SITE_OTHER): Payer: 59 | Admitting: Nurse Practitioner

## 2022-11-04 ENCOUNTER — Encounter: Payer: Self-pay | Admitting: Nurse Practitioner

## 2022-11-04 VITALS — BP 130/70 | HR 105 | Temp 98.4°F | Ht 67.0 in | Wt 336.0 lb

## 2022-11-04 DIAGNOSIS — Z794 Long term (current) use of insulin: Secondary | ICD-10-CM | POA: Diagnosis not present

## 2022-11-04 DIAGNOSIS — Z6841 Body Mass Index (BMI) 40.0 and over, adult: Secondary | ICD-10-CM | POA: Diagnosis not present

## 2022-11-04 DIAGNOSIS — Z7985 Long-term (current) use of injectable non-insulin antidiabetic drugs: Secondary | ICD-10-CM

## 2022-11-04 DIAGNOSIS — E1165 Type 2 diabetes mellitus with hyperglycemia: Secondary | ICD-10-CM | POA: Diagnosis not present

## 2022-11-04 DIAGNOSIS — R110 Nausea: Secondary | ICD-10-CM | POA: Insufficient documentation

## 2022-11-04 DIAGNOSIS — R112 Nausea with vomiting, unspecified: Secondary | ICD-10-CM | POA: Diagnosis not present

## 2022-11-04 MED ORDER — SEMAGLUTIDE(0.25 OR 0.5MG/DOS) 2 MG/3ML ~~LOC~~ SOPN
0.5000 mg | PEN_INJECTOR | SUBCUTANEOUS | 1 refills | Status: DC
Start: 2022-11-04 — End: 2023-03-06

## 2022-11-04 MED ORDER — BLOOD GLUCOSE MONITOR KIT
PACK | 0 refills | Status: DC
Start: 2022-11-04 — End: 2022-11-04

## 2022-11-04 MED ORDER — ONDANSETRON HCL 4 MG PO TABS: 4.0000 mg | ORAL_TABLET | Freq: Every day | ORAL | 1 refills | Status: AC | PRN

## 2022-11-04 MED ORDER — TRESIBA FLEXTOUCH 100 UNIT/ML ~~LOC~~ SOPN
10.0000 [IU] | PEN_INJECTOR | Freq: Every day | SUBCUTANEOUS | 0 refills | Status: DC
Start: 2022-11-04 — End: 2022-11-08

## 2022-11-04 MED ORDER — PROMETHAZINE HCL 50 MG/ML IJ SOLN
25.0000 mg | Freq: Once | INTRAMUSCULAR | Status: AC
Start: 2022-11-04 — End: 2022-11-04
  Administered 2022-11-04: 25 mg via INTRAMUSCULAR

## 2022-11-04 NOTE — Progress Notes (Signed)
[name], CMA,acting as a Neurosurgeon for [name], FNP.,have documented all relevant documentation on the behalf of [name], FNP,as directed by  [name], FNP while in the presence of [name], FNP.  Subjective:  Patient ID: Erika Frey , female    DOB: 04-12-1996 , 27 y.o.   MRN: [id number]  Chief Complaint  Patient presents with   Diabetes    HPI  Patient presents today for a ER f/u.  She was feeling bad all week, was thirsty, frequency in urination, pain to her legs. Checked blood sugar was 409, at ER up to 500's. She is not eating well since being home. She is nauseated and diarrhea. She was discharged with metformin and has not been able to eat. Patient is here with her mother. Patient reports she has been taking the metformin but its making her really sick. She is having diarrhea and vomiting. Patient is really not eating much. She checked her blood sugar was 243 this morning. She reports she is feeling really tired and drained. Patients blood sugar in office today was 241. Her legs were also sore. She had been eating unhealthy diet.   He does not have a schedule and will sleep all day and eats late at night.      Past Medical History:  Diagnosis Date   Absent menses    MOTHER STATES PT STARTED PERIOD AT AGE 46 BUT LAST PERIOD WAS 2 YRS AGO , AGE 77   Asthma    Eczema    Exotropia of left eye    Immunizations up to date    Simple constipation    OCCASIONAL   Sinus headache    Urticaria      Family History  Problem Relation Age of Onset   Hypertension Mother    Diabetes Mother    Heart disease Mother    Sleep apnea Mother    Hypertension Maternal Grandmother    Heart disease Maternal Grandmother    Sleep apnea Other      Current Outpatient Medications:    albuterol (PROVENTIL HFA;VENTOLIN HFA) 108 (90 BASE) MCG/ACT inhaler, Inhale 2 puffs into the lungs every 6 (six) hours as needed., Disp: , Rfl:    Blood Glucose Monitoring Suppl DEVI, 1 each  by Does not apply route in the morning, at noon, and at bedtime. May substitute to any manufacturer covered by patient's insurance., Disp: 1 each, Rfl: 0   cetirizine (ZYRTEC) 10 MG tablet, Take 1 tablet by mouth daily., Disp: , Rfl:    cyanocobalamin (VITAMIN B12) 100 MCG tablet, Take 100 mcg by mouth daily., Disp: , Rfl:    fluticasone (FLONASE) 50 MCG/ACT nasal spray, Place 2 sprays into both nostrils daily., Disp: , Rfl:    Glucose Blood (BLOOD GLUCOSE TEST STRIPS) STRP, 1 each by In Vitro route in the morning, at noon, and at bedtime. May substitute to any manufacturer covered by patient's insurance., Disp: 100 strip, Rfl: 0   Insulin Pen Needle (NOVOFINE PLUS PEN NEEDLE) 32G X 4 MM MISC, 1 each by Does not apply route daily., Disp: 100 each, Rfl: 3   Lancet Device MISC, 1 each by Does not apply route in the morning, at noon, and at bedtime. May substitute to any manufacturer covered by patient's insurance., Disp: 1 each, Rfl: 0   Lancets Misc. MISC, 1 each by Does not apply route in the morning, at noon, and at bedtime. May substitute to any manufacturer covered by patient's insurance., Disp: 100  each, Rfl: 0   montelukast (SINGULAIR) 10 MG tablet, Take 1 tablet (10 mg total) by mouth at bedtime., Disp: 30 tablet, Rfl: 5   Multiple Vitamin (MULTIVITAMIN) capsule, Take 1 capsule by mouth daily., Disp: , Rfl:    Olopatadine HCl (PATADAY) 0.2 % SOLN, Place 1 drop into both eyes daily as needed., Disp: 2.5 mL, Rfl: 5   ondansetron (ZOFRAN) 4 MG tablet, Take 1 tablet (4 mg total) by mouth daily as needed for nausea or vomiting., Disp: 30 tablet, Rfl: 1   Ruxolitinib Phosphate (OPZELURA) 1.5 % CREA, Apply to affected areas twice a day as needed, Disp: 60 g, Rfl: 5   Semaglutide,0.25 or 0.5MG /DOS, 2 MG/3ML SOPN, Inject 0.5 mg into the skin once a week., Disp: 9 mL, Rfl: 1   tacrolimus (PROTOPIC) 0.1 % ointment, APPLY TOPICALLY 2 TIMES A DAY TO LARGE BODY SURFACE AREA (Patient taking differently: Apply 1  Application topically 2 (two) times daily. 1), Disp: 60 g, Rfl: 2   insulin glargine (LANTUS) 100 UNIT/ML Solostar Pen, Inject 40 Units into the skin daily., Disp: 15 mL, Rfl: 2   insulin lispro (HUMALOG) 100 UNIT/ML KwikPen, Inject 12 Units into the skin 3 (three) times daily., Disp: 15 mL, Rfl: 2   triamcinolone cream (KENALOG) 0.1 %, Apply 1 Application topically 2 (two) times daily., Disp: , Rfl:    Allergies  Allergen Reactions   Penicillin G     Other reaction(s): Unknown   Penicillins Other (See Comments)    Stomach cramps     Review of Systems  Constitutional: Negative.   Respiratory: Negative.    Cardiovascular: Negative.   Gastrointestinal:  Positive for nausea and vomiting. Negative for abdominal distention.  Neurological: Negative.   Psychiatric/Behavioral: Negative.       Today's Vitals   11/04/22 1538  BP: 130/70  Pulse: (!) 105  Temp: 98.4 F (36.9 C)  Weight: (!) 336 lb (152.4 kg)  Height: 5\' 7"  (1.702 m)  PainSc: 0-No pain   Body mass index is 52.63 kg/m.  Wt Readings from Last 3 Encounters:  11/13/22 (!) 347 lb 6.4 oz (157.6 kg)  11/05/22 (!) 336 lb (152.4 kg)  11/04/22 (!) 336 lb (152.4 kg)    Objective:  Physical Exam Vitals reviewed.  Constitutional:      General: She is not in acute distress.    Appearance: Normal appearance. She is well-developed. She is obese.  HENT:     Head: Normocephalic and atraumatic.  Eyes:     Pupils: Pupils are equal, round, and reactive to light.  Cardiovascular:     Rate and Rhythm: Normal rate and regular rhythm.     Pulses: Normal pulses.     Heart sounds: Normal heart sounds. No murmur heard. Pulmonary:     Effort: Pulmonary effort is normal. No respiratory distress.     Breath sounds: Normal breath sounds. No wheezing.  Genitourinary:    Exam position: Lithotomy position.     Tanner stage (genital): 5.     Labia:        Right: No rash.        Left: No rash.      Vagina: Normal.     Cervix: Normal  and dilated.     Uterus: Normal.      Adnexa: Right adnexa normal and left adnexa normal.  Skin:    General: Skin is warm and dry.     Capillary Refill: Capillary refill takes less than 2 seconds.  Neurological:  General: No focal deficit present.     Mental Status: She is alert and oriented to person, place, and time.     Cranial Nerves: No cranial nerve deficit.     Motor: No weakness.  Psychiatric:        Mood and Affect: Mood normal.        Behavior: Behavior normal.        Thought Content: Thought content normal.        Judgment: Judgment normal.      Assessment And Plan:  Type 2 diabetes mellitus with hyperglycemia, without long-term current use of insulin (HCC) Assessment & Plan: This is a new diagnosis.  Blood sugar was up 600s.  She is to start Ozempic first dose was given in the office.  She will also start Tresiba 10 units daily.  Will set her up with diabetic educator Genelle.  During office visit she had vomiting Phenergan given.  Given Dexcom and applied while in office.  Orders: -     Hemoglobin A1c -     Microalbumin / creatinine urine ratio -     Semaglutide(0.25 or 0.5MG /DOS); Inject 0.5 mg into the skin once a week.  Dispense: 9 mL; Refill: 1 -     NovoFine Plus Pen Needle; 1 each by Does not apply route daily.  Dispense: 100 each; Refill: 3 -     POCT URINALYSIS DIP (CLINITEK)  Nausea and vomiting, unspecified vomiting type Assessment & Plan: Phenergan 25 mg given in office IM.  Her mother is driving today.  Also given Zofran to take at home.  Encouraged to do clear liquids  Orders: -     Ondansetron HCl; Take 1 tablet (4 mg total) by mouth daily as needed for nausea or vomiting.  Dispense: 30 tablet; Refill: 1 -     Promethazine HCl  Class 3 severe obesity due to excess calories with body mass index (BMI) of 45.0 to 49.9 in adult, unspecified whether serious comorbidity present (HCC)    Return for uncontrolled DM check-3 months.  Patient was given  opportunity to ask questions. Patient verbalized understanding of the plan and was able to repeat key elements of the plan. All questions were answered to their satisfaction.    Jeanell Sparrow, FNP, have reviewed all documentation for this visit. The documentation on 11/04/22 for the exam, diagnosis, procedures, and orders are all accurate and complete.   IF YOU HAVE BEEN REFERRED TO A SPECIALIST, IT MAY TAKE 1-2 WEEKS TO SCHEDULE/PROCESS THE REFERRAL. IF YOU HAVE NOT HEARD FROM US/SPECIALIST IN TWO WEEKS, PLEASE GIVE [place] A CALL AT [phone].

## 2022-11-04 NOTE — Patient Instructions (Addendum)
Genelle - diabetic educator will be calling you in the next couple days.  Limit your intake of white breads, rice, potato, and pastas. Cut back on sweets, cereal (do not eat at bedtime/nighttime). Increase fiber intake, you can take an over the counter fiber supplement daily to help.

## 2022-11-05 ENCOUNTER — Inpatient Hospital Stay (HOSPITAL_COMMUNITY)
Admission: EM | Admit: 2022-11-05 | Discharge: 2022-11-08 | DRG: 638 | Disposition: A | Discharge status: Home / Self Care | Payer: 59 | Attending: Internal Medicine | Admitting: Internal Medicine

## 2022-11-05 ENCOUNTER — Encounter (HOSPITAL_COMMUNITY): Payer: Self-pay

## 2022-11-05 ENCOUNTER — Other Ambulatory Visit: Payer: Self-pay

## 2022-11-05 DIAGNOSIS — Z8249 Family history of ischemic heart disease and other diseases of the circulatory system: Secondary | ICD-10-CM | POA: Diagnosis not present

## 2022-11-05 DIAGNOSIS — Z7984 Long term (current) use of oral hypoglycemic drugs: Secondary | ICD-10-CM

## 2022-11-05 DIAGNOSIS — Z6841 Body Mass Index (BMI) 40.0 and over, adult: Secondary | ICD-10-CM

## 2022-11-05 DIAGNOSIS — E860 Dehydration: Secondary | ICD-10-CM | POA: Diagnosis not present

## 2022-11-05 DIAGNOSIS — R112 Nausea with vomiting, unspecified: Secondary | ICD-10-CM | POA: Diagnosis not present

## 2022-11-05 DIAGNOSIS — R000 Tachycardia, unspecified: Secondary | ICD-10-CM | POA: Diagnosis not present

## 2022-11-05 DIAGNOSIS — Z794 Long term (current) use of insulin: Secondary | ICD-10-CM | POA: Diagnosis not present

## 2022-11-05 DIAGNOSIS — E1011 Type 1 diabetes mellitus with ketoacidosis with coma: Secondary | ICD-10-CM

## 2022-11-05 DIAGNOSIS — J45909 Unspecified asthma, uncomplicated: Secondary | ICD-10-CM | POA: Diagnosis present

## 2022-11-05 DIAGNOSIS — Z79899 Other long term (current) drug therapy: Secondary | ICD-10-CM | POA: Diagnosis not present

## 2022-11-05 DIAGNOSIS — E1165 Type 2 diabetes mellitus with hyperglycemia: Secondary | ICD-10-CM

## 2022-11-05 DIAGNOSIS — Z880 Allergy status to penicillin: Secondary | ICD-10-CM | POA: Diagnosis not present

## 2022-11-05 DIAGNOSIS — E1110 Type 2 diabetes mellitus with ketoacidosis without coma: Secondary | ICD-10-CM | POA: Diagnosis not present

## 2022-11-05 DIAGNOSIS — Z833 Family history of diabetes mellitus: Secondary | ICD-10-CM | POA: Diagnosis not present

## 2022-11-05 DIAGNOSIS — Z7985 Long-term (current) use of injectable non-insulin antidiabetic drugs: Secondary | ICD-10-CM

## 2022-11-05 DIAGNOSIS — E66813 Obesity, class 3: Secondary | ICD-10-CM

## 2022-11-05 LAB — URINALYSIS, ROUTINE W REFLEX MICROSCOPIC
Bilirubin Urine: NEGATIVE
Glucose, UA: >=500 mg/dL — AB
Hgb urine dipstick: NEGATIVE
Ketones, ur: 80 mg/dL — AB
Leukocytes,Ua: NEGATIVE
Nitrite: NEGATIVE
Protein, ur: 30 mg/dL — AB
Specific Gravity, Urine: 1.017 (ref 1.005–1.030)
pH: 5 (ref 5.0–8.0)

## 2022-11-05 LAB — BASIC METABOLIC PANEL
Anion gap: 19 — ABNORMAL HIGH (ref 5–15)
BUN: 15 mg/dL (ref 6–20)
CO2: 11 mmol/L — ABNORMAL LOW (ref 22–32)
Calcium: 9.5 mg/dL (ref 8.9–10.3)
Chloride: 103 mmol/L (ref 98–111)
Creatinine, Ser: 1.03 mg/dL — ABNORMAL HIGH (ref 0.44–1.00)
GFR, Estimated: >60 mL/min (ref >60)
Glucose, Bld: 242 mg/dL — ABNORMAL HIGH (ref 70–99)
Potassium: 4.2 mmol/L (ref 3.5–5.1)
Sodium: 133 mmol/L — ABNORMAL LOW (ref 135–145)

## 2022-11-05 LAB — BLOOD GAS, VENOUS
Acid-base deficit: 13.5 mmol/L — ABNORMAL HIGH (ref 0.0–2.0)
Bicarbonate: 13.8 mmol/L — ABNORMAL LOW (ref 20.0–28.0)
O2 Saturation: 81.3 %
Patient temperature: 37
pCO2, Ven: 36 mmHg — ABNORMAL LOW (ref 44–60)
pH, Ven: 7.19 — CL (ref 7.25–7.43)
pO2, Ven: 55 mmHg — ABNORMAL HIGH (ref 32–45)

## 2022-11-05 LAB — POCT URINALYSIS DIP (CLINITEK)
Glucose, UA: 500 mg/dL — AB
Leukocytes, UA: NEGATIVE
Nitrite, UA: NEGATIVE
POC PROTEIN,UA: NEGATIVE
Spec Grav, UA: >=1.03 — AB (ref 1.010–1.025)
Urobilinogen, UA: 0.2 E.U./dL
pH, UA: 5.5 (ref 5.0–8.0)

## 2022-11-05 LAB — HEMOGLOBIN A1C
Est. average glucose Bld gHb Est-mCnc: 240 mg/dL
Hgb A1c MFr Bld: 10 % — ABNORMAL HIGH (ref 4.8–5.6)

## 2022-11-05 LAB — CBG MONITORING, ED
Glucose-Capillary: 234 mg/dL — ABNORMAL HIGH (ref 70–99)
Glucose-Capillary: 209 mg/dL — ABNORMAL HIGH (ref 70–99)
Glucose-Capillary: 190 mg/dL — ABNORMAL HIGH (ref 70–99)
Glucose-Capillary: 197 mg/dL — ABNORMAL HIGH (ref 70–99)
Glucose-Capillary: 197 mg/dL — ABNORMAL HIGH (ref 70–99)

## 2022-11-05 LAB — CBC
HCT: 47 % — ABNORMAL HIGH (ref 36.0–46.0)
Hemoglobin: 15 g/dL (ref 12.0–15.0)
MCH: 24.2 pg — ABNORMAL LOW (ref 26.0–34.0)
MCHC: 31.9 g/dL (ref 30.0–36.0)
MCV: 75.9 fL — ABNORMAL LOW (ref 80.0–100.0)
Platelets: 384 10*3/uL (ref 150–400)
RBC: 6.19 MIL/uL — ABNORMAL HIGH (ref 3.87–5.11)
RDW: 18 % — ABNORMAL HIGH (ref 11.5–15.5)
WBC: 16 10*3/uL — ABNORMAL HIGH (ref 4.0–10.5)
nRBC: 0 % (ref 0.0–0.2)

## 2022-11-05 LAB — GLUCOSE, CAPILLARY: Glucose-Capillary: 180 mg/dL — ABNORMAL HIGH (ref 70–99)

## 2022-11-05 LAB — HCG, SERUM, QUALITATIVE: Preg, Serum: NEGATIVE

## 2022-11-05 LAB — BETA-HYDROXYBUTYRIC ACID: Beta-Hydroxybutyric Acid: 6.39 mmol/L — ABNORMAL HIGH (ref 0.05–0.27)

## 2022-11-05 MED ORDER — DEXTROSE IN LACTATED RINGERS 5 % IV SOLN: INTRAVENOUS | Status: DC

## 2022-11-05 MED ORDER — ONDANSETRON HCL 4 MG PO TABS: 4.0000 mg | ORAL_TABLET | Freq: Four times a day (QID) | ORAL | Status: DC | PRN

## 2022-11-05 MED ORDER — DEXTROSE 50 % IV SOLN: 0.0000 mL | INTRAVENOUS | Status: DC | PRN

## 2022-11-05 MED ORDER — ACETAMINOPHEN 325 MG PO TABS
650.0000 mg | ORAL_TABLET | Freq: Four times a day (QID) | ORAL | Status: DC | PRN
  Filled 2022-11-05: qty 2

## 2022-11-05 MED ORDER — ONDANSETRON HCL 4 MG/2ML IJ SOLN: 4.0000 mg | Freq: Four times a day (QID) | INTRAMUSCULAR | Status: DC | PRN

## 2022-11-05 MED ORDER — INSULIN REGULAR(HUMAN) IN NACL 100-0.9 UT/100ML-% IV SOLN
INTRAVENOUS | Status: DC
  Administered 2022-11-05: 9 [IU]/h via INTRAVENOUS
  Administered 2022-11-06: 6 [IU]/h via INTRAVENOUS
  Filled 2022-11-05 (×2): qty 100

## 2022-11-05 MED ORDER — ENOXAPARIN SODIUM 40 MG/0.4ML IJ SOSY
40.0000 mg | PREFILLED_SYRINGE | INTRAMUSCULAR | Status: DC
  Administered 2022-11-05: 40 mg via SUBCUTANEOUS
  Filled 2022-11-05: qty 0.4

## 2022-11-05 MED ORDER — LACTATED RINGERS IV BOLUS
2000.0000 mL | Freq: Once | INTRAVENOUS | Status: AC
  Administered 2022-11-05: 2000 mL via INTRAVENOUS

## 2022-11-05 MED ORDER — CHLORHEXIDINE GLUCONATE CLOTH 2 % EX PADS
6.0000 | MEDICATED_PAD | Freq: Every day | CUTANEOUS | Status: DC
  Administered 2022-11-06 – 2022-11-08 (×2): 6 via TOPICAL

## 2022-11-05 MED ORDER — ACETAMINOPHEN 650 MG RE SUPP: 650.0000 mg | Freq: Four times a day (QID) | RECTAL | Status: DC | PRN

## 2022-11-05 MED ORDER — SODIUM CHLORIDE 0.9 % IV SOLN
25.0000 mg | Freq: Once | INTRAVENOUS | Status: AC
  Administered 2022-11-05: 25 mg via INTRAVENOUS
  Filled 2022-11-05: qty 25

## 2022-11-05 MED ORDER — POTASSIUM CHLORIDE 10 MEQ/100ML IV SOLN
10.0000 meq | INTRAVENOUS | Status: AC
  Filled 2022-11-05: qty 100

## 2022-11-05 MED ORDER — NOVOFINE PLUS PEN NEEDLE 32G X 4 MM MISC
1.0000 | Freq: Every day | 3 refills | Status: AC
Start: 2022-11-05 — End: ?

## 2022-11-05 MED ORDER — BISACODYL 5 MG PO TBEC: 5.0000 mg | DELAYED_RELEASE_TABLET | Freq: Every day | ORAL | Status: DC | PRN

## 2022-11-05 NOTE — ED Notes (Signed)
ED TO INPATIENT HANDOFF REPORT  ED Nurse Name and Phone #: Thamas Jaegers Name/Age/Gender Erika Frey 27 y.o. female Room/Bed: WA01/WA01  Code Status   Code Status: Full Code  Home/SNF/Other Home Patient oriented to: self, place, time, and situation Is this baseline? Yes   Triage Complete: Triage complete  Chief Complaint DKA (diabetic ketoacidosis) (HCC) [E11.10]  Triage Note Pt seen recently at [facility] for hyperglycemia and released. Pt concerned because blood sugar has been running high at home. Pt has been vomiting and has had decreaed PO intake, C/o fatigue and weakness. Increased thirst and frequent urination.  Pt had 10 units of insulin PTA.  Recent diagnosis of type 2 diabetes.    Allergies Allergies  Allergen Reactions   Penicillin G     Other reaction(s): Unknown   Penicillins Other (See Comments)    Stomach cramps    Level of Care/Admitting Diagnosis ED Disposition     ED Disposition  Admit   Condition  --   Comment  Hospital Area: [facility] [[id number]]  Level of Care: Stepdown [14]  Admit to SDU based on following criteria: Severe physiological/psychological symptoms:  Any diagnosis requiring assessment & intervention at least every 4 hours on an ongoing basis to obtain desired patient outcomes including stability and rehabilitation  May admit patient to [facility] or [facility] if equivalent level of care is available:: Yes  Covid Evaluation: Asymptomatic - no recent exposure (last 10 days) testing not required  Diagnosis: DKA (diabetic ketoacidosis) (HCC) [960454]  Admitting Physician: Buena Irish [[id number]]  Attending Physician: Buena Irish [phone]  Certification:: I certify this patient will need inpatient services for at least 2 midnights  Estimated Length of Stay: 4          B Medical/Surgery History Past Medical History:  Diagnosis Date   Absent menses    MOTHER STATES PT STARTED PERIOD AT AGE 89 BUT LAST  PERIOD WAS 2 YRS AGO , AGE 66   Asthma    Eczema    Exotropia of left eye    Immunizations up to date    Simple constipation    OCCASIONAL   Sinus headache    Urticaria    Past Surgical History:  Procedure Laterality Date   FOOT SURGERY  AGE 4   MEDIAN RECTUS REPAIR Left 12/16/2012   Procedure: MEDIAN RECTUS RESECTION LEFT EYE;  Surgeon: Corinda Gubler, MD;  Location: [facility];  Service: Ophthalmology;  Laterality: Left;   MUSCLE RECESSION AND RESECTION Left 12/16/2012   Procedure: LATERAL RECTUS RECESSION LEFT EYE;  Surgeon: Corinda Gubler, MD;  Location: [facility];  Service: Ophthalmology;  Laterality: Left;   TONSILLECTOMY AND ADENOIDECTOMY  AGE 67     A IV Location/Drains/Wounds Patient Lines/Drains/Airways Status     Active Line/Drains/Airways     Name Placement date Placement time Site Days   Peripheral IV 11/05/22 20 G Left Antecubital 11/05/22  1706  Antecubital  less than 1   Peripheral IV 11/05/22 20 G Anterior;Distal;Right;Upper Arm 11/05/22  2021  Arm  less than 1            Intake/Output Last 24 hours  Intake/Output Summary (Last 24 hours) at 11/05/2022 2251 Last data filed at 11/05/2022 2153 Gross per 24 hour  Intake 1715.12 ml  Output --  Net 1715.12 ml    Labs/Imaging Results for orders placed or performed during the hospital encounter of 11/05/22 (from the past 48 hour(s))  CBG monitoring, ED     Status: Abnormal   Collection Time: 11/05/22  2:50 PM  Result Value Ref Range   Glucose-Capillary 234 (H) 70 - 99 mg/dL    Comment: Glucose reference range applies only to samples taken after fasting for at least 8 hours.  Basic metabolic panel     Status: Abnormal   Collection Time: 11/05/22  3:22 PM  Result Value Ref Range   Sodium 133 (L) 135 - 145 mmol/L   Potassium 4.2 3.5 - 5.1 mmol/L   Chloride 103 98 - 111 mmol/L   CO2 11 (L) 22 - 32 mmol/L   Glucose, Bld 242 (H) 70 - 99 mg/dL    Comment: Glucose reference  range applies only to samples taken after fasting for at least 8 hours.   BUN 15 6 - 20 mg/dL   Creatinine, Ser 1.61 (H) 0.44 - 1.00 mg/dL   Calcium 9.5 8.9 - 09.6 mg/dL   GFR, Estimated >04 >54 mL/min    Comment: (NOTE) Calculated using the CKD-EPI Creatinine Equation (2021)    Anion gap 19 (H) 5 - 15    Comment: Performed at [facility], [address] [place], [place] [address]  CBC     Status: Abnormal   Collection Time: 11/05/22  3:22 PM  Result Value Ref Range   WBC 16.0 (H) 4.0 - 10.5 K/uL   RBC 6.19 (H) 3.87 - 5.11 MIL/uL   Hemoglobin 15.0 12.0 - 15.0 g/dL   HCT 91.4 (H) 78.2 - 95.6 %   MCV 75.9 (L) 80.0 - 100.0 fL   MCH 24.2 (L) 26.0 - 34.0 pg   MCHC 31.9 30.0 - 36.0 g/dL   RDW 21.3 (H) 08.6 - 57.8 %   Platelets 384 150 - 400 K/uL   nRBC 0.0 0.0 - 0.2 %    Comment: Performed at [facility], [address], [place], [place] [address]  hCG, serum, qualitative     Status: None   Collection Time: 11/05/22  3:22 PM  Result Value Ref Range   Preg, Serum NEGATIVE NEGATIVE    Comment:        THE SENSITIVITY OF THIS METHODOLOGY IS >10 mIU/mL. Performed at [facility], [address], [place], [place] [address]   Beta-hydroxybutyric acid     Status: Abnormal   Collection Time: 11/05/22  3:22 PM  Result Value Ref Range   Beta-Hydroxybutyric Acid 6.39 (H) 0.05 - 0.27 mmol/L    Comment: RESULT CONFIRMED BY MANUAL DILUTION Performed at [facility], [address], [place], [place] [address]   Urinalysis, Routine w reflex microscopic -Urine, Clean Catch     Status: Abnormal   Collection Time: 11/05/22  4:12 PM  Result Value Ref Range   Color, Urine YELLOW YELLOW   APPearance CLEAR CLEAR   Specific Gravity, Urine 1.017 1.005 - 1.030   pH 5.0 5.0 - 8.0   Glucose, UA >=500 (A) NEGATIVE mg/dL   Hgb urine dipstick NEGATIVE NEGATIVE   Bilirubin Urine NEGATIVE NEGATIVE   Ketones, ur 80 (A) NEGATIVE  mg/dL   Protein, ur 30 (A) NEGATIVE mg/dL   Nitrite NEGATIVE NEGATIVE   Leukocytes,Ua NEGATIVE NEGATIVE   RBC / HPF 0-5 0 - 5 RBC/hpf   WBC, UA 0-5 0 - 5 WBC/hpf   Bacteria, UA RARE (A) NONE SEEN   Squamous Epithelial / HPF 0-5 0 - 5 /HPF   Hyaline Casts, UA PRESENT  Comment: Performed at [facility], [address], [place], [place] [address]  Blood gas, venous (at [facility])     Status: Abnormal   Collection Time: 11/05/22  5:04 PM  Result Value Ref Range   pH, Ven 7.19 (LL) 7.25 - 7.43    Comment: CRITICAL RESULT CALLED TO, READ BACK BY AND VERIFIED WITH: RN I CORTES AT 1748 11/05/22 CRUICKSHANK A    pCO2, Ven 36 (L) 44 - 60 mmHg   pO2, Ven 55 (H) 32 - 45 mmHg   Bicarbonate 13.8 (L) 20.0 - 28.0 mmol/L   Acid-base deficit 13.5 (H) 0.0 - 2.0 mmol/L   O2 Saturation 81.3 %   Patient temperature 37.0     Comment: Performed at [facility], [address] [place], [place] [address]  CBG monitoring, ED     Status: Abnormal   Collection Time: 11/05/22  7:42 PM  Result Value Ref Range   Glucose-Capillary 209 (H) 70 - 99 mg/dL    Comment: Glucose reference range applies only to samples taken after fasting for at least 8 hours.  CBG monitoring, ED     Status: Abnormal   Collection Time: 11/05/22  8:43 PM  Result Value Ref Range   Glucose-Capillary 190 (H) 70 - 99 mg/dL    Comment: Glucose reference range applies only to samples taken after fasting for at least 8 hours.  CBG monitoring, ED     Status: Abnormal   Collection Time: 11/05/22  9:50 PM  Result Value Ref Range   Glucose-Capillary 197 (H) 70 - 99 mg/dL    Comment: Glucose reference range applies only to samples taken after fasting for at least 8 hours.   No results found.  Pending Labs Unresulted Labs (From admission, onward)     Start     Ordered   11/06/22 0500  CBC  Tomorrow morning,   R        11/05/22 2054   11/06/22 0500  Magnesium  Tomorrow morning,   R        11/05/22  2054   11/06/22 0500  Phosphorus  Tomorrow morning,   R        11/05/22 2054   11/06/22 0030  Basic metabolic panel  Now then every 4 hours,   R     Comments: Notify MD when CO2 >20    11/05/22 2054   11/05/22 2045  HIV Antibody (routine testing w rflx)  (HIV Antibody (Routine testing w reflex) panel)  Once,   R        11/05/22 2046   11/05/22 1931  Anti-islet cell antibody  Once,   URGENT        11/05/22 1930   11/05/22 1841  Basic metabolic panel  Once,   STAT        11/05/22 1840            Vitals/Pain Today's Vitals   11/05/22 1940 11/05/22 2020 11/05/22 2030 11/05/22 2105  BP: 124/89 (!) 143/101 (!) 141/104 (!) 136/90  Pulse: (!) 109 (!) 108 (!) 106 (!) 113  Resp: (!) 22 (!) 21 20 (!) 22  Temp:      TempSrc:      SpO2: 97% 99% 99% 99%  Weight:      Height:      PainSc:        Isolation Precautions No active isolations  Medications Medications  insulin regular, human (MYXREDLIN) 100 units/ 100 mL infusion (8  Units/hr Intravenous Infusion Verify 11/05/22 2153)  dextrose 5 % in lactated ringers infusion ( Intravenous New Bag/Given 11/05/22 2029)  dextrose 50 % solution 0-50 mL (has no administration in time range)  potassium chloride 10 mEq in 100 mL IVPB (10 mEq Intravenous Not Given 11/05/22 2132)  enoxaparin (LOVENOX) injection 40 mg (has no administration in time range)  acetaminophen (TYLENOL) tablet 650 mg (has no administration in time range)    Or  acetaminophen (TYLENOL) suppository 650 mg (has no administration in time range)  ondansetron (ZOFRAN) tablet 4 mg (has no administration in time range)    Or  ondansetron (ZOFRAN) injection 4 mg (has no administration in time range)  bisacodyl (DULCOLAX) EC tablet 5 mg (has no administration in time range)  promethazine (PHENERGAN) 25 mg in sodium chloride 0.9 % 50 mL IVPB (0 mg Intravenous Stopped 11/05/22 1846)  lactated ringers bolus 2,000 mL (0 mLs Intravenous Stopped 11/05/22 1936)    Mobility walks      Focused Assessments     R Recommendations: See Admitting Provider Note  Report given to:   Additional Notes:

## 2022-11-05 NOTE — ED Notes (Signed)
Awaiting second iv for other med administration

## 2022-11-05 NOTE — ED Provider Notes (Signed)
[place] EMERGENCY DEPARTMENT AT [facility] Provider Note   CSN: [id number] Arrival date & time: 11/05/22  1441     History  Chief Complaint  Patient presents with   Fatigue   Hyperglycemia    Erika Frey is a 27 y.o. female with past medical history asthma and newly diagnosed type 2 diabetes who presents to the ED complaining of uncontrolled nausea and vomiting.  She states that she was seen in the ED 4 days ago where she found out that her blood sugars were uncontrolled after having a few days of increased thirst and urination. No previous diabetes diagnosis. She was sent home on  Metformin but unable to tolerate this medication due to nausea. Followed up with her PCP and found to have A1C 10 and started on Ozempic. She has continued to have uncontrolled vomiting as well as diarrhea and inability to keep nausea medication down. She denies chest pain, shortness of breath. Minimal, generalized abdominal pain with vomiting. No previous abdominal surgeries. Notes sugar got as high as 499 over the last week when using family member's glucometer to measure this. States she feels very weak and tired.       Home Medications Prior to Admission medications   Medication Sig Start Date End Date Taking? Authorizing Provider  cetirizine (ZYRTEC) 10 MG tablet Take 1 tablet by mouth daily. 05/30/20  Yes [provider]  cyanocobalamin (VITAMIN B12) 100 MCG tablet Take 100 mcg by mouth daily.   Yes [provider]  fluticasone (FLONASE) 50 MCG/ACT nasal spray Place 2 sprays into both nostrils daily. 12/14/20  Yes [provider]  insulin degludec (TRESIBA FLEXTOUCH) 100 UNIT/ML FlexTouch Pen Inject 10 Units into the skin daily. 11/04/22  Yes [name], FNP  montelukast (SINGULAIR) 10 MG tablet Take 1 tablet (10 mg total) by mouth at bedtime. 03/21/22  Yes Padgett, Pilar Grammes, MD  Multiple Vitamin (MULTIVITAMIN) capsule Take 1 capsule by mouth daily.   Yes  [provider]  Olopatadine HCl (PATADAY) 0.2 % SOLN Place 1 drop into both eyes daily as needed. 03/21/22  Yes Padgett, Pilar Grammes, MD  ondansetron (ZOFRAN) 4 MG tablet Take 1 tablet (4 mg total) by mouth daily as needed for nausea or vomiting. 11/04/22 11/04/23 Yes [name], FNP  Ruxolitinib Phosphate (OPZELURA) 1.5 % CREA Apply to affected areas twice a day as needed 03/21/22  Yes Padgett, Pilar Grammes, MD  Semaglutide,0.25 or 0.5MG /DOS, 2 MG/3ML SOPN Inject 0.5 mg into the skin once a week. 11/04/22  Yes [name], FNP  tacrolimus (PROTOPIC) 0.1 % ointment APPLY TOPICALLY 2 TIMES A DAY TO LARGE BODY SURFACE AREA Patient taking differently: Apply 1 Application topically 2 (two) times daily. 1 09/03/21  Yes Sheffield, Kelli R, PA-C  triamcinolone cream (KENALOG) 0.1 % Apply 1 Application topically 2 (two) times daily. 07/17/20  Yes [provider]  albuterol (PROVENTIL HFA;VENTOLIN HFA) 108 (90 BASE) MCG/ACT inhaler Inhale 2 puffs into the lungs every 6 (six) hours as needed.    [provider]  Blood Glucose Monitoring Suppl DEVI 1 each by Does not apply route in the morning, at noon, and at bedtime. May substitute to any manufacturer covered by patient's insurance. 11/03/22   Antony Madura, PA-C  Glucose Blood (BLOOD GLUCOSE TEST STRIPS) STRP 1 each by In Vitro route in the morning, at noon, and at bedtime. May substitute to any manufacturer covered by patient's insurance. 11/03/22 12/03/22  Antony Madura, PA-C  Insulin Pen Needle (NOVOFINE  PLUS PEN NEEDLE) 32G X 4 MM MISC 1 each by Does not apply route daily. 11/05/22   [name], FNP  Lancet Device MISC 1 each by Does not apply route in the morning, at noon, and at bedtime. May substitute to any manufacturer covered by patient's insurance. 11/03/22 12/03/22  Antony Madura, PA-C  Lancets Misc. MISC 1 each by Does not apply route in the morning, at noon, and at bedtime. May substitute to any manufacturer covered by  patient's insurance. 11/03/22 12/03/22  Antony Madura, PA-C      Allergies    Penicillin g and Penicillins    Review of Systems   Review of Systems  All other systems reviewed and are negative.   Physical Exam Updated Vital Signs BP (!) 167/127   Pulse (!) 126   Temp 98.4 F (36.9 C) (Oral)   Resp (!) 21   Ht 5\' 7"  (1.702 m)   Wt (!) 152.4 kg   LMP 10/22/2022   SpO2 97%   BMI 52.63 kg/m  Physical Exam Vitals and nursing note reviewed.  Constitutional:      General: She is in acute distress.     Appearance: Normal appearance. She is ill-appearing.     Comments: Generally weak  HENT:     Head: Normocephalic and atraumatic.     Mouth/Throat:     Mouth: Mucous membranes are dry.  Eyes:     Conjunctiva/sclera: Conjunctivae normal.  Cardiovascular:     Rate and Rhythm: Regular rhythm. Tachycardia present.     Heart sounds: No murmur heard. Pulmonary:     Effort: Tachypnea present.     Breath sounds: Normal breath sounds. No stridor. No decreased breath sounds, wheezing, rhonchi or rales.  Abdominal:     General: Abdomen is flat. There is no distension.     Palpations: Abdomen is soft.     Tenderness: There is no abdominal tenderness. There is no right CVA tenderness, left CVA tenderness, guarding or rebound.  Musculoskeletal:        General: No deformity. Normal range of motion.     Cervical back: Neck supple.     Right lower leg: No edema.     Left lower leg: No edema.  Skin:    General: Skin is warm and dry.     Capillary Refill: Capillary refill takes less than 2 seconds.     Coloration: Skin is not jaundiced or pale.     Findings: No rash.  Neurological:     General: No focal deficit present.     Mental Status: She is alert and oriented to person, place, and time.  Psychiatric:        Mood and Affect: Affect is flat.        Behavior: Behavior normal. Behavior is cooperative.     ED Results / Procedures / Treatments   Labs (all labs ordered are listed, but  only abnormal results are displayed) Labs Reviewed  BASIC METABOLIC PANEL - Abnormal; Notable for the following components:      Result Value   Sodium 133 (*)    CO2 11 (*)    Glucose, Bld 242 (*)    Creatinine, Ser 1.03 (*)    Anion gap 19 (*)    All other components within normal limits  CBC - Abnormal; Notable for the following components:   WBC 16.0 (*)    RBC 6.19 (*)    HCT 47.0 (*)    MCV 75.9 (*)  MCH 24.2 (*)    RDW 18.0 (*)    All other components within normal limits  URINALYSIS, ROUTINE W REFLEX MICROSCOPIC - Abnormal; Notable for the following components:   Glucose, UA >=500 (*)    Ketones, ur 80 (*)    Protein, ur 30 (*)    Bacteria, UA RARE (*)    All other components within normal limits  BLOOD GAS, VENOUS - Abnormal; Notable for the following components:   pH, Ven 7.19 (*)    pCO2, Ven 36 (*)    pO2, Ven 55 (*)    Bicarbonate 13.8 (*)    Acid-base deficit 13.5 (*)    All other components within normal limits  BETA-HYDROXYBUTYRIC ACID - Abnormal; Notable for the following components:   Beta-Hydroxybutyric Acid 6.39 (*)    All other components within normal limits  CBG MONITORING, ED - Abnormal; Notable for the following components:   Glucose-Capillary 234 (*)    All other components within normal limits  HCG, SERUM, QUALITATIVE  BASIC METABOLIC PANEL    EKG EKG Interpretation Date/Time:  Tuesday November 05 2022 15:09:12 EDT Ventricular Rate:  118 PR Interval:  120 QRS Duration:  79 QT Interval:  319 QTC Calculation: 447 R Axis:   159  Text Interpretation: Right and left arm electrode reversal, interpretation assumes no reversal Sinus tachycardia Right axis deviation Borderline Q waves in lateral leads Borderline repolarization abnormality Confirmed by Linwood Dibbles [phone]) on 11/05/2022 3:16:10 PM  Radiology No results found.  Procedures .Critical Care  Performed by: Tonette Lederer, PA-C Authorized by: Tonette Lederer, PA-C   Critical care  provider statement:    Critical care time (minutes):  45   Critical care start time:  11/05/2022 3:20 PM   Critical care end time:  11/05/2022 7:00 PM   Critical care time was exclusive of:  Separately billable procedures and treating other patients and teaching time   Critical care was necessary to treat or prevent imminent or life-threatening deterioration of the following conditions:  Cardiac failure, circulatory failure, renal failure, respiratory failure, sepsis, metabolic crisis, hepatic failure, endocrine crisis and dehydration   Critical care was time spent personally by me on the following activities:  Development of treatment plan with patient or surrogate, discussions with consultants, evaluation of patient's response to treatment, examination of patient, ordering and review of laboratory studies, ordering and review of radiographic studies, ordering and performing treatments and interventions, pulse oximetry, re-evaluation of patient's condition, review of old charts, blood draw for specimens and obtaining history from patient or surrogate   I assumed direction of critical care for this patient from another provider in my specialty: no     Care discussed with: admitting provider       Medications Ordered in ED Medications  insulin regular, human (MYXREDLIN) 100 units/ 100 mL infusion (has no administration in time range)  dextrose 5 % in lactated ringers infusion (has no administration in time range)  dextrose 50 % solution 0-50 mL (has no administration in time range)  potassium chloride 10 mEq in 100 mL IVPB (has no administration in time range)  promethazine (PHENERGAN) 25 mg in sodium chloride 0.9 % 50 mL IVPB (0 mg Intravenous Stopped 11/05/22 1846)  lactated ringers bolus 2,000 mL (2,000 mLs Intravenous New Bag/Given 11/05/22 1720)    ED Course/ Medical Decision Making/ A&P  Medical Decision Making Amount and/or Complexity of Data Reviewed Labs:  ordered. Decision-making details documented in ED Course. Radiology: ordered. Decision-making details documented in ED Course. ECG/medicine tests: ordered. Decision-making details documented in ED Course.  Risk Prescription drug management. Decision regarding hospitalization.   Medical Decision Making:   Erika [place] is a 27 y.o. female who presented to the ED today with hyperglycemia detailed above.    Additional history discussed with patient's family/caregivers.  Complete initial physical exam performed, notably the patient was tachycardia, tachypneic. LCTA. Alert and oriented.  Abdomen soft and nontender.  Moderately dry mucous membranes.    Reviewed and confirmed nursing documentation for past medical history, family history, social history.    Initial Assessment:   With the patient's presentation, differential diagnosis includes but is not limited to DKA, HHS, electrolyte/metabolic disturbance, AKI, dehydration, gastroenteritis, viral syndrome, acute abdomen, UTI, infectious process.  This is most consistent with an acute complicated illness  Initial Plan:  Screening labs including CBC and Metabolic panel to evaluate for infectious or metabolic etiology of disease.  Urinalysis with reflex culture ordered to evaluate for UTI or relevant urologic/nephrologic pathology.  EKG to evaluate for cardiac pathology BHB, VBG to assess for DKA Fluids to help with hyperglycemia Objective evaluation as below reviewed   Initial Study Results:   Laboratory  All laboratory results reviewed without evidence of clinically relevant pathology.   Exceptions include: pH 7.19, sodium 133, glucose in the low 200s, WBC 16, BHB 6.39  EKG EKG was reviewed independently. Sinus tachycardia, no acute ST-T changes, no STEMI.    Consults: Case discussed with Dr. Gasper Sells, who recommended D5/insulin drip, accepts admission.   Final Assessment and Plan:   27 year old female presents to ED c/o uncontrolled  blood sugars, nausea, and vomiting.  Newly diagnosed with type 2 diabetes.  Patient tachycardic, slightly tachypneic on initial exam with moderately dry mucous membranes.  She does appear ill.  Abdomen soft and nontender.  Reviewed labs from previous visit.  At that visit, patient had an ALT, slightly elevated EHB.  She was sent home with metformin.  Has not been tolerating this secondary to side effects including nausea and diarrhea.  Has trialed antiemetics at home as well but still not tolerating medications.  Seen by PCP this week.  A1c 10.  Started on Ozempic.  States unable to eat or drink due to symptoms. Generally weak on exam but no focal deficits. Alert and oriented. Do not suspect HHS. Labs reveal increased BHB, pH 7.19, anion gap of 19. Nausea controlled with ED medications. Tachycardia improved, tachypnea resolved. LCTA. With worsening labs and difficulty tolerating p.o., discussed with hospitalist who accepts admission and recommends starting insulin drip and D5.  Discussed plan with patient and mother at bedside who are agreeable with admission.    Clinical Impression:  1. Diabetic ketoacidosis without coma associated with type 2 diabetes mellitus (HCC)   2. Dehydration   3. Nausea and vomiting, unspecified vomiting type      Admit           Final Clinical Impression(s) / ED Diagnoses Final diagnoses:  Diabetic ketoacidosis without coma associated with type 2 diabetes mellitus (HCC)  Dehydration  Nausea and vomiting, unspecified vomiting type    Rx / DC Orders ED Discharge Orders     None         Tonette Lederer, PA-C 11/05/22 1926    Vanetta Mulders, MD 11/07/22 1253

## 2022-11-05 NOTE — H&P (Addendum)
History and Physical    Patient: Erika Frey ZOX:[id number] DOB: May 17, 1995 DOA: 11/05/2022 DOS: the patient was seen and examined on 11/05/2022 PCP: [name], FNP  Patient coming from: Home  Chief Complaint:  Chief Complaint  Patient presents with   Fatigue   Hyperglycemia   HPI: Erika Frey and Erika Frey is a 27 y.o. female with medical history significant for morbid obesity, mild eczema and mild asthma.  My history comes from the patient's mother as the patient is too lethargic to give history at this time.  The patient was discharged from the hospital 5 days ago after being diagnosed with new onset diabetes mellitus.  She was discharged on metformin.  The mother reports that since discharge the patient has been sleeping almost non stop every day.  She has also been vomiting repeatedly.  She got into see her primary care physician yesterday follow-up from the hospital and her new diagnosis.  The doctor took her off the metformin and started her on Lantus 10 units daily.  The mother knows lots of people in the medical field and was advised to bring her in to the hospital because of her decreased level of consciousness. In the emergency department the patient was found to be in DKA.  She was started on an insulin drip and will be admitted.  I talked to the mother at length about the patient's diagnosis and treatment.  The mother also has diabetes but she has never heard of DKA.  Review of Systems: unable to review all systems due to the inability of the patient to answer questions. Past Medical History:  Diagnosis Date   Absent menses    MOTHER STATES PT STARTED PERIOD AT AGE 89 BUT LAST PERIOD WAS 2 YRS AGO , AGE 27   Asthma    Eczema    Exotropia of left eye    Immunizations up to date    Simple constipation    OCCASIONAL   Sinus headache    Urticaria    Past Surgical History:  Procedure Laterality Date   FOOT SURGERY  AGE 66   MEDIAN RECTUS REPAIR Left 12/16/2012   Procedure: MEDIAN  RECTUS RESECTION LEFT EYE;  Surgeon: Corinda Gubler, MD;  Location: [facility];  Service: Ophthalmology;  Laterality: Left;   MUSCLE RECESSION AND RESECTION Left 12/16/2012   Procedure: LATERAL RECTUS RECESSION LEFT EYE;  Surgeon: Corinda Gubler, MD;  Location: [facility];  Service: Ophthalmology;  Laterality: Left;   TONSILLECTOMY AND ADENOIDECTOMY  AGE 6   Social History:  reports that she has never smoked. She has never used smokeless tobacco. She reports that she does not drink alcohol and does not use drugs.  Allergies  Allergen Reactions   Penicillin G     Other reaction(s): Unknown   Penicillins Other (See Comments)    Stomach cramps    Family History  Problem Relation Age of Onset   Hypertension Mother    Diabetes Mother    Heart disease Mother    Sleep apnea Mother    Hypertension Maternal Grandmother    Heart disease Maternal Grandmother    Sleep apnea Other     Prior to Admission medications   Medication Sig Start Date End Date Taking? Authorizing Provider  cetirizine (ZYRTEC) 10 MG tablet Take 1 tablet by mouth daily. 05/30/20  Yes [provider]  cyanocobalamin (VITAMIN B12) 100 MCG tablet Take 100 mcg by mouth daily.   Yes [provider]  fluticasone Aleda Grana)  50 MCG/ACT nasal spray Place 2 sprays into both nostrils daily. 12/14/20  Yes [provider]  insulin degludec (TRESIBA FLEXTOUCH) 100 UNIT/ML FlexTouch Pen Inject 10 Units into the skin daily. 11/04/22  Yes [name], FNP  montelukast (SINGULAIR) 10 MG tablet Take 1 tablet (10 mg total) by mouth at bedtime. 03/21/22  Yes Padgett, Pilar Grammes, MD  Multiple Vitamin (MULTIVITAMIN) capsule Take 1 capsule by mouth daily.   Yes [provider]  Olopatadine HCl (PATADAY) 0.2 % SOLN Place 1 drop into both eyes daily as needed. 03/21/22  Yes Padgett, Pilar Grammes, MD  ondansetron (ZOFRAN) 4 MG tablet Take 1 tablet (4 mg total) by mouth  daily as needed for nausea or vomiting. 11/04/22 11/04/23 Yes [name], FNP  Ruxolitinib Phosphate (OPZELURA) 1.5 % CREA Apply to affected areas twice a day as needed 03/21/22  Yes Padgett, Pilar Grammes, MD  Semaglutide,0.25 or 0.5MG /DOS, 2 MG/3ML SOPN Inject 0.5 mg into the skin once a week. 11/04/22  Yes [name], FNP  tacrolimus (PROTOPIC) 0.1 % ointment APPLY TOPICALLY 2 TIMES A DAY TO LARGE BODY SURFACE AREA Patient taking differently: Apply 1 Application topically 2 (two) times daily. 1 09/03/21  Yes Sheffield, Kelli R, PA-C  triamcinolone cream (KENALOG) 0.1 % Apply 1 Application topically 2 (two) times daily. 07/17/20  Yes [provider]  albuterol (PROVENTIL HFA;VENTOLIN HFA) 108 (90 BASE) MCG/ACT inhaler Inhale 2 puffs into the lungs every 6 (six) hours as needed.    [provider]  Blood Glucose Monitoring Suppl DEVI 1 each by Does not apply route in the morning, at noon, and at bedtime. May substitute to any manufacturer covered by patient's insurance. 11/03/22   Antony Madura, PA-C  Glucose Blood (BLOOD GLUCOSE TEST STRIPS) STRP 1 each by In Vitro route in the morning, at noon, and at bedtime. May substitute to any manufacturer covered by patient's insurance. 11/03/22 12/03/22  Antony Madura, PA-C  Insulin Pen Needle (NOVOFINE PLUS PEN NEEDLE) 32G X 4 MM MISC 1 each by Does not apply route daily. 11/05/22   [name], FNP  Lancet Device MISC 1 each by Does not apply route in the morning, at noon, and at bedtime. May substitute to any manufacturer covered by patient's insurance. 11/03/22 12/03/22  Antony Madura, PA-C  Lancets Misc. MISC 1 each by Does not apply route in the morning, at noon, and at bedtime. May substitute to any manufacturer covered by patient's insurance. 11/03/22 12/03/22  Antony Madura, PA-C    Physical Exam: Vitals:   11/05/22 1938 11/05/22 1940 11/05/22 2020 11/05/22 2030  BP:  124/89 (!) 143/101 (!) 141/104  Pulse:  (!) 109 (!) 108 (!) 106  Resp:   (!) 22 (!) 21 20  Temp: 97.9 F (36.6 C)     TempSrc: Oral     SpO2:  97% 99% 99%  Weight:      Height:       Physical Exam:  General: lethargic HEENT: Normocephalic, atraumatic Cardiovascular: Normal rate and rhythm. Distal pulses intact. Pulmonary: Normal pulmonary effort, normal breath sounds Gastrointestinal: Nondistended abdomen, soft, non-tender, normoactive bowel sounds Musculoskeletal:Normal ROM, no lower ext edema Lymphadenopathy: No cervical LAD. Skin: Skin is warm and dry. Neuro: lethargic.  She will briefly open her eyes and I saw her ambulate to the bathroom. PSYCH: unable to cooperate  Data Reviewed:  Results for orders placed or performed during the hospital encounter of 11/05/22 (from the past 24 hour(s))  CBG monitoring, ED  Status: Abnormal   Collection Time: 11/05/22  2:50 PM  Result Value Ref Range   Glucose-Capillary 234 (H) 70 - 99 mg/dL  Basic metabolic panel     Status: Abnormal   Collection Time: 11/05/22  3:22 PM  Result Value Ref Range   Sodium 133 (L) 135 - 145 mmol/L   Potassium 4.2 3.5 - 5.1 mmol/L   Chloride 103 98 - 111 mmol/L   CO2 11 (L) 22 - 32 mmol/L   Glucose, Bld 242 (H) 70 - 99 mg/dL   BUN 15 6 - 20 mg/dL   Creatinine, Ser 1.30 (H) 0.44 - 1.00 mg/dL   Calcium 9.5 8.9 - 86.5 mg/dL   GFR, Estimated >78 >46 mL/min   Anion gap 19 (H) 5 - 15  CBC     Status: Abnormal   Collection Time: 11/05/22  3:22 PM  Result Value Ref Range   WBC 16.0 (H) 4.0 - 10.5 K/uL   RBC 6.19 (H) 3.87 - 5.11 MIL/uL   Hemoglobin 15.0 12.0 - 15.0 g/dL   HCT 96.2 (H) 95.2 - 84.1 %   MCV 75.9 (L) 80.0 - 100.0 fL   MCH 24.2 (L) 26.0 - 34.0 pg   MCHC 31.9 30.0 - 36.0 g/dL   RDW 32.4 (H) 40.1 - 02.7 %   Platelets 384 150 - 400 K/uL   nRBC 0.0 0.0 - 0.2 %  hCG, serum, qualitative     Status: None   Collection Time: 11/05/22  3:22 PM  Result Value Ref Range   Preg, Serum NEGATIVE NEGATIVE  Beta-hydroxybutyric acid     Status: Abnormal   Collection Time:  11/05/22  3:22 PM  Result Value Ref Range   Beta-Hydroxybutyric Acid 6.39 (H) 0.05 - 0.27 mmol/L  Urinalysis, Routine w reflex microscopic -Urine, Clean Catch     Status: Abnormal   Collection Time: 11/05/22  4:12 PM  Result Value Ref Range   Color, Urine YELLOW YELLOW   APPearance CLEAR CLEAR   Specific Gravity, Urine 1.017 1.005 - 1.030   pH 5.0 5.0 - 8.0   Glucose, UA >=500 (A) NEGATIVE mg/dL   Hgb urine dipstick NEGATIVE NEGATIVE   Bilirubin Urine NEGATIVE NEGATIVE   Ketones, ur 80 (A) NEGATIVE mg/dL   Protein, ur 30 (A) NEGATIVE mg/dL   Nitrite NEGATIVE NEGATIVE   Leukocytes,Ua NEGATIVE NEGATIVE   RBC / HPF 0-5 0 - 5 RBC/hpf   WBC, UA 0-5 0 - 5 WBC/hpf   Bacteria, UA RARE (A) NONE SEEN   Squamous Epithelial / HPF 0-5 0 - 5 /HPF   Hyaline Casts, UA PRESENT   Blood gas, venous (at [facility] and [facility])     Status: Abnormal   Collection Time: 11/05/22  5:04 PM  Result Value Ref Range   pH, Ven 7.19 (LL) 7.25 - 7.43   pCO2, Ven 36 (L) 44 - 60 mmHg   pO2, Ven 55 (H) 32 - 45 mmHg   Bicarbonate 13.8 (L) 20.0 - 28.0 mmol/L   Acid-base deficit 13.5 (H) 0.0 - 2.0 mmol/L   O2 Saturation 81.3 %   Patient temperature 37.0   CBG monitoring, ED     Status: Abnormal   Collection Time: 11/05/22  7:42 PM  Result Value Ref Range   Glucose-Capillary 209 (H) 70 - 99 mg/dL  CBG monitoring, ED     Status: Abnormal   Collection Time: 11/05/22  8:43 PM  Result Value Ref Range   Glucose-Capillary 190 (H) 70 - 99 mg/dL  Assessment and Plan: DKA - management per protocol Patient presented with anion gap of 19 and pH of 7.1  Patient has been placed on insulin infusion Patient is being hydrated aggressively with intravenous isotonic fluids Performing serial chemistries and serial beta hydroxybutyrate levels Patient is currently n.p.o. with exception of ice chips and sips of water Patient will remain on insulin infusion until anion gap is closed This is possibly DM Type 1.5.  Will check antibodies  to islet cells. She will need insulin rather than oral agents in the future.    Advance Care Planning:   Code Status: Full Code  The patient is not able to answer questions at this time so she will be full code.  Consults: none  Family Communication: Discussed with patient's mother with whom she lives  Severity of Illness: The appropriate patient status for this patient is INPATIENT. Inpatient status is judged to be reasonable and necessary in order to provide the required intensity of service to ensure the patient's safety. The patient's presenting symptoms, physical exam findings, and initial radiographic and laboratory data in the context of their chronic comorbidities is felt to place them at high risk for further clinical deterioration. Furthermore, it is not anticipated that the patient will be medically stable for discharge from the hospital within 2 midnights of admission.   * I certify that at the point of admission it is my clinical judgment that the patient will require inpatient hospital care spanning beyond 2 midnights from the point of admission due to high intensity of service, high risk for further deterioration and high frequency of surveillance required.*  Author: Buena Irish, MD 11/05/2022 8:55 PM  For on call review [email or web address].

## 2022-11-05 NOTE — ED Notes (Signed)
Dr. Adelene Idler contacted regarding potassium order in [facility]. Verbal order to hold potassium IV per Dr. Adelene Idler.

## 2022-11-05 NOTE — ED Triage Notes (Signed)
Pt seen recently at [facility] for hyperglycemia and released. Pt concerned because blood sugar has been running high at home. Pt has been vomiting and has had decreaed PO intake, C/o fatigue and weakness. Increased thirst and frequent urination.  Pt had 10 units of insulin PTA.  Recent diagnosis of type 2 diabetes.

## 2022-11-06 DIAGNOSIS — E1110 Type 2 diabetes mellitus with ketoacidosis without coma: Secondary | ICD-10-CM | POA: Diagnosis not present

## 2022-11-06 LAB — GLUCOSE, CAPILLARY
Glucose-Capillary: 155 mg/dL — ABNORMAL HIGH (ref 70–99)
Glucose-Capillary: 173 mg/dL — ABNORMAL HIGH (ref 70–99)
Glucose-Capillary: 180 mg/dL — ABNORMAL HIGH (ref 70–99)
Glucose-Capillary: 181 mg/dL — ABNORMAL HIGH (ref 70–99)
Glucose-Capillary: 182 mg/dL — ABNORMAL HIGH (ref 70–99)
Glucose-Capillary: 182 mg/dL — ABNORMAL HIGH (ref 70–99)
Glucose-Capillary: 183 mg/dL — ABNORMAL HIGH (ref 70–99)
Glucose-Capillary: 184 mg/dL — ABNORMAL HIGH (ref 70–99)
Glucose-Capillary: 189 mg/dL — ABNORMAL HIGH (ref 70–99)
Glucose-Capillary: 195 mg/dL — ABNORMAL HIGH (ref 70–99)
Glucose-Capillary: 205 mg/dL — ABNORMAL HIGH (ref 70–99)
Glucose-Capillary: 211 mg/dL — ABNORMAL HIGH (ref 70–99)
Glucose-Capillary: 214 mg/dL — ABNORMAL HIGH (ref 70–99)
Glucose-Capillary: 233 mg/dL — ABNORMAL HIGH (ref 70–99)

## 2022-11-06 LAB — CBC
HCT: 42.8 % (ref 36.0–46.0)
Hemoglobin: 13.7 g/dL (ref 12.0–15.0)
MCH: 24.6 pg — ABNORMAL LOW (ref 26.0–34.0)
MCHC: 32 g/dL (ref 30.0–36.0)
MCV: 77 fL — ABNORMAL LOW (ref 80.0–100.0)
Platelets: 329 10*3/uL (ref 150–400)
RBC: 5.56 MIL/uL — ABNORMAL HIGH (ref 3.87–5.11)
RDW: 17.4 % — ABNORMAL HIGH (ref 11.5–15.5)
WBC: 14.3 10*3/uL — ABNORMAL HIGH (ref 4.0–10.5)
nRBC: 0 % (ref 0.0–0.2)

## 2022-11-06 LAB — BASIC METABOLIC PANEL
Anion gap: 13 (ref 5–15)
Anion gap: 12 (ref 5–15)
Anion gap: 13 (ref 5–15)
Anion gap: 12 (ref 5–15)
BUN: 11 mg/dL (ref 6–20)
BUN: 10 mg/dL (ref 6–20)
BUN: 8 mg/dL (ref 6–20)
BUN: 8 mg/dL (ref 6–20)
CO2: 16 mmol/L — ABNORMAL LOW (ref 22–32)
CO2: 16 mmol/L — ABNORMAL LOW (ref 22–32)
CO2: 16 mmol/L — ABNORMAL LOW (ref 22–32)
CO2: 19 mmol/L — ABNORMAL LOW (ref 22–32)
Calcium: 9.1 mg/dL (ref 8.9–10.3)
Calcium: 9 mg/dL (ref 8.9–10.3)
Calcium: 8.8 mg/dL — ABNORMAL LOW (ref 8.9–10.3)
Calcium: 9.2 mg/dL (ref 8.9–10.3)
Chloride: 104 mmol/L (ref 98–111)
Chloride: 106 mmol/L (ref 98–111)
Chloride: 107 mmol/L (ref 98–111)
Chloride: 107 mmol/L (ref 98–111)
Creatinine, Ser: 0.85 mg/dL (ref 0.44–1.00)
Creatinine, Ser: 0.89 mg/dL (ref 0.44–1.00)
Creatinine, Ser: 0.77 mg/dL (ref 0.44–1.00)
Creatinine, Ser: 0.84 mg/dL (ref 0.44–1.00)
GFR, Estimated: >60 mL/min (ref >60)
GFR, Estimated: >60 mL/min (ref >60)
GFR, Estimated: >60 mL/min (ref >60)
GFR, Estimated: >60 mL/min (ref >60)
Glucose, Bld: 190 mg/dL — ABNORMAL HIGH (ref 70–99)
Glucose, Bld: 179 mg/dL — ABNORMAL HIGH (ref 70–99)
Glucose, Bld: 183 mg/dL — ABNORMAL HIGH (ref 70–99)
Glucose, Bld: 182 mg/dL — ABNORMAL HIGH (ref 70–99)
Potassium: 4 mmol/L (ref 3.5–5.1)
Potassium: 3.6 mmol/L (ref 3.5–5.1)
Potassium: 3.3 mmol/L — ABNORMAL LOW (ref 3.5–5.1)
Potassium: 3.2 mmol/L — ABNORMAL LOW (ref 3.5–5.1)
Sodium: 133 mmol/L — ABNORMAL LOW (ref 135–145)
Sodium: 134 mmol/L — ABNORMAL LOW (ref 135–145)
Sodium: 136 mmol/L (ref 135–145)
Sodium: 138 mmol/L (ref 135–145)

## 2022-11-06 LAB — MRSA NEXT GEN BY PCR, NASAL: MRSA by PCR Next Gen: NOT DETECTED

## 2022-11-06 LAB — MICROALBUMIN / CREATININE URINE RATIO
Creatinine, Urine: 41.8 mg/dL
Microalb/Creat Ratio: 56 mg/g creat — ABNORMAL HIGH (ref 0–29)
Microalbumin, Urine: 23.2 ug/mL

## 2022-11-06 LAB — PHOSPHORUS: Phosphorus: 2.5 mg/dL (ref 2.5–4.6)

## 2022-11-06 LAB — HIV ANTIBODY (ROUTINE TESTING W REFLEX): HIV Screen 4th Generation wRfx: NONREACTIVE

## 2022-11-06 LAB — MAGNESIUM: Magnesium: 2.2 mg/dL (ref 1.7–2.4)

## 2022-11-06 MED ORDER — INSULIN ASPART 100 UNIT/ML IJ SOLN
5.0000 [IU] | Freq: Three times a day (TID) | INTRAMUSCULAR | Status: DC
  Administered 2022-11-06 (×2): 5 [IU] via SUBCUTANEOUS

## 2022-11-06 MED ORDER — INSULIN GLARGINE-YFGN 100 UNIT/ML ~~LOC~~ SOLN
20.0000 [IU] | Freq: Every day | SUBCUTANEOUS | Status: DC
  Administered 2022-11-06: 20 [IU] via SUBCUTANEOUS
  Filled 2022-11-06 (×2): qty 0.2

## 2022-11-06 MED ORDER — INSULIN ASPART 100 UNIT/ML IJ SOLN
0.0000 [IU] | Freq: Every day | INTRAMUSCULAR | Status: DC
  Administered 2022-11-06 – 2022-11-07 (×2): 2 [IU] via SUBCUTANEOUS

## 2022-11-06 MED ORDER — INSULIN ASPART 100 UNIT/ML IJ SOLN
0.0000 [IU] | Freq: Three times a day (TID) | INTRAMUSCULAR | Status: DC
  Administered 2022-11-06: 5 [IU] via SUBCUTANEOUS
  Administered 2022-11-07: 8 [IU] via SUBCUTANEOUS
  Administered 2022-11-07 – 2022-11-08 (×3): 5 [IU] via SUBCUTANEOUS

## 2022-11-06 MED ORDER — INSULIN STARTER KIT- PEN NEEDLES (ENGLISH)
1.0000 | Freq: Once | Status: AC
  Administered 2022-11-06: 1
  Filled 2022-11-06: qty 1

## 2022-11-06 MED ORDER — ORAL CARE MOUTH RINSE: 15.0000 mL | OROMUCOSAL | Status: DC | PRN

## 2022-11-06 MED ORDER — ENOXAPARIN SODIUM 80 MG/0.8ML IJ SOSY
75.0000 mg | PREFILLED_SYRINGE | INTRAMUSCULAR | Status: DC
  Administered 2022-11-06 – 2022-11-07 (×2): 75 mg via SUBCUTANEOUS
  Filled 2022-11-06 (×2): qty 0.8

## 2022-11-06 MED ORDER — LIVING WELL WITH DIABETES BOOK
Freq: Once | Status: AC
  Filled 2022-11-06: qty 1

## 2022-11-06 MED ORDER — POTASSIUM CHLORIDE CRYS ER 20 MEQ PO TBCR
40.0000 meq | EXTENDED_RELEASE_TABLET | ORAL | Status: AC
  Administered 2022-11-06 (×2): 40 meq via ORAL
  Filled 2022-11-06 (×2): qty 2

## 2022-11-06 NOTE — Plan of Care (Addendum)
Nutrition Education Note  RD consulted for nutrition education regarding diabetes.   Lab Results  Component Value Date   HGBA1C 10.0 (H) 11/04/2022   Patient sleeping at time of visit, mom at bedside wanting and willing to receive diet education/handouts.  Mom reports patient usually only eats around 1 full meal a day. Has been sleeping a lot recently and wakes up around 2pm, has a snack, and then a dinner late at night. Common intake includes breads, pasta, cereal, desserts. Drinks water of and vitamin water at home but when she goes out to eat will often get a lemonade or sweet tea. Has not been able to eat a lot the past week due to vomiting and sleeping a lot.  RD provided "Carbohydrate Counting for People with Diabetes", "Plate Method for Diabetes", and "Using Nutrition Labels: Carbohydrate" handouts from the Academy of Nutrition and Dietetics. Discussed different food groups and their effects on blood sugar, emphasizing carbohydrate-containing foods. Provided list of carbohydrate containing foods and reviewed importance of looking at nutrition labels.  Discussed importance of controlled and consistent carbohydrate intake throughout the day. Provided examples of ways to balance meals/snacks and encouraged intake of high-fiber, whole grain complex carbohydrates. Teach back method used.  Expect fair compliance.  Body mass index is 52.63 kg/m. Pt meets criteria for Morbid Obesity based on current BMI.  Current diet order is Carb Modified, patient is consuming approximately 95% of meals at this time. Labs and medications reviewed. RD contact information provided. Encouraged mom to review diet education handouts and have patient review handouts and inform RN to reach out to RD if any questions arise. No further nutrition interventions warranted at this time. If additional nutrition issues arise, please re-consult RD.   Shelle Iron RD, LDN For contact information, refer to [facility].

## 2022-11-06 NOTE — Progress Notes (Addendum)
PROGRESS NOTE    Erika Frey and Erika Frey  ZOX:[id number] DOB: 04-23-1996 DOA: 11/05/2022 PCP: [name], FNP    Brief Narrative:   Erika Frey is a 27 y.o. female with past medical history significant for morbid obesity, asthma, eczema, recently diagnosed with type 2 diabetes mellitus who presented to [facility] ED on 7/2 with fatigue, weakness, nausea/vomiting and decreased oral intake. Patient was recently discharged on metformin for new diagnosis of diabetes.  Since discharge, mother reports has been sleeping more frequently and now with nausea and vomiting.  Seen by her PCP day prior to admission and was started on Lantus 10 units daily.  Given her progressive symptoms, patient was brought to the ED for further evaluation and management.  In the ED, temperature 98.4 F, HR 126, RR 24, BP 118/95, SpO2 97% on room air.  WBC 16.0, hemoglobin 15.0, platelets 384.  Sodium 133, potassium 4.2, chloride 103, CO2 11, glucose 242, BUN 15, creatinine 1.03.  Anion gap 19.  Urinalysis with 80 ketones, greater than 500 glucose.  Patient was started on insulin drip, given IV fluid resuscitation.  TRH consulted for admission for further evaluation management of DKA  Assessment & Plan:   Diabetic ketoacidosis Type 2 diabetes mellitus Patient presenting to ED with progressive weakness, fatigue associated with nausea and vomiting.  Recently diagnosed with type 2 diabetes mellitus and started on metformin outpatient.  VBG with pH 7.19.  Glucose 242 with anion gap 19 on admission.  Initially started on insulin drip which has now been transitioned to subcutaneous insulin. -- Diabetic educator following, appreciate assistance -- Hemoglobin A1c 10.0 on 11/04/2022, poorly controlled -- Semglee 20u [place] daily -- Novolog 5u TIDAC -- Moderate SSI for coverage -- CBG before every meal/at bedtime -- Consistent carbohydrate diet -- Nutrition consult -- Transfer to med/surg unit today  Morbid obesity Body mass  index is 52.63 kg/m.  Discussed with patient needs for aggressive lifestyle changes/weight loss as this complicates all facets of care.  Outpatient follow-up with PCP.      DVT prophylaxis: Lovenox    Code Status: Full Code Family Communication: Updated mother present at bedside this morning  Disposition Plan:  Level of care: Med-Surg Status is: Inpatient Remains inpatient appropriate because: Transitioning from insulin drip today, anticipate discharge home in 1-2 days    Consultants:  None  Procedures:  None  Antimicrobials:  None   Subjective: Patient seen examined bedside, resting calmly.  Lying in bed.  Family present.  Glucose much better controlled, continues on insulin drip.  Discussed with patient, family will transition to subcutaneous insulin this morning.  Extensively discussed need for dietary changes, nutrition consult and diabetic educator consult currently pending.  No other specific questions or concerns at this time.  Denies headache, no dizziness, no chest pain, no palpitations, no shortness of breath, no abdominal pain, no fever/chills/night sweats, no nausea/vomiting/diarrhea, no focal weakness, no paresthesias.  No acute events overnight per nursing staff.  Objective: Vitals:   11/06/22 1100 11/06/22 1200 11/06/22 1215 11/06/22 1400  BP: 121/65 127/83  131/81  Pulse: 96 (!) 105    Resp: 17 19 20 18   Temp:   98 F (36.7 C)   TempSrc:   Oral   SpO2: 100% 99%    Weight:      Height:        Intake/Output Summary (Last 24 hours) at 11/06/2022 1536 Last data filed at 11/06/2022 0900 Gross per 24 hour  Intake 3320.18 ml  Output --  Net 3320.18 ml   Filed Weights   11/05/22 1501  Weight: (!) 152.4 kg    Examination:  Physical Exam: GEN: NAD, alert and oriented x 3, obese HEENT: NCAT, PERRL, EOMI, sclera clear, MMM PULM: CTAB w/o wheezes/crackles, normal respiratory effort, on room air CV: RRR w/o M/G/R GI: abd soft, NTND, NABS, no R/G/M MSK: no  peripheral edema, muscle strength globally intact 5/5 bilateral upper/lower extremities NEURO: CN II-XII intact, no focal deficits, sensation to light touch intact PSYCH: normal mood/affect Integumentary: dry/intact, no rashes or wounds    Data Reviewed: I have personally reviewed following labs and imaging studies  CBC: Recent Labs  Lab 11/03/22 0140 11/03/22 0156 11/03/22 0419 11/05/22 1522 11/06/22 0456  WBC  --  14.1*  --  16.0* 14.3*  HGB 15.3* 13.8 14.6 15.0 13.7  HCT 45.0 41.4 43.0 47.0* 42.8  MCV  --  74.7*  --  75.9* 77.0*  PLT  --  379  --  384 329   Basic Metabolic Panel: Recent Labs  Lab 11/05/22 1522 11/06/22 0018 11/06/22 0456 11/06/22 0824 11/06/22 1208  NA 133* 133* 134* 136 138  K 4.2 4.0 3.6 3.3* 3.2*  CL 103 104 106 107 107  CO2 11* 16* 16* 16* 19*  GLUCOSE 242* 190* 179* 183* 182*  BUN 15 11 10 8 8   CREATININE 1.03* 0.85 0.89 0.77 0.84  CALCIUM 9.5 9.1 9.0 8.8* 9.2  MG  --   --  2.2  --   --   PHOS  --   --  2.5  --   --    GFR: Estimated Creatinine Clearance: 155.5 mL/min (by C-G formula based on SCr of 0.84 mg/dL). Liver Function Tests: No results for input(s): "AST", "ALT", "ALKPHOS", "BILITOT", "PROT", "ALBUMIN" in the last 168 hours. No results for input(s): "LIPASE", "AMYLASE" in the last 168 hours. No results for input(s): "AMMONIA" in the last 168 hours. Coagulation Profile: No results for input(s): "INR", "PROTIME" in the last 168 hours. Cardiac Enzymes: No results for input(s): "CKTOTAL", "CKMB", "CKMBINDEX", "TROPONINI" in the last 168 hours. BNP (last 3 results) No results for input(s): "PROBNP" in the last 8760 hours. HbA1C: Recent Labs    11/04/22 1728  HGBA1C 10.0*   CBG: Recent Labs  Lab 11/06/22 0648 11/06/22 0757 11/06/22 0857 11/06/22 1010 11/06/22 1118  GLUCAP 183* 173* 182* 181* 205*   Lipid Profile: No results for input(s): "CHOL", "HDL", "LDLCALC", "TRIG", "CHOLHDL", "LDLDIRECT" in the last 72  hours. Thyroid Function Tests: No results for input(s): "TSH", "T4TOTAL", "FREET4", "T3FREE", "THYROIDAB" in the last 72 hours. Anemia Panel: No results for input(s): "VITAMINB12", "FOLATE", "FERRITIN", "TIBC", "IRON", "RETICCTPCT" in the last 72 hours. Sepsis Labs: No results for input(s): "PROCALCITON", "LATICACIDVEN" in the last 168 hours.  Recent Results (from the past 240 hour(s))  MRSA Next Gen by PCR, Nasal     Status: None   Collection Time: 11/05/22 11:37 PM   Specimen: Nasal Mucosa; Nasal Swab  Result Value Ref Range Status   MRSA by PCR Next Gen NOT DETECTED NOT DETECTED Final    Comment: (NOTE) The GeneXpert MRSA Assay (FDA approved for NASAL specimens only), is one component of a comprehensive MRSA colonization surveillance program. It is not intended to diagnose MRSA infection nor to guide or monitor treatment for MRSA infections. Test performance is not FDA approved in patients less than 75 years old. Performed at [facility], [address], [place], [place] [address]  Radiology Studies: No results found.      Scheduled Meds:  Chlorhexidine Gluconate Cloth  6 each Topical Q0600   enoxaparin (LOVENOX) injection  75 mg Subcutaneous Q24H   insulin aspart  0-15 Units Subcutaneous TID WC   insulin aspart  0-5 Units Subcutaneous QHS   insulin aspart  5 Units Subcutaneous TID WC   insulin glargine-yfgn  20 Units Subcutaneous Daily   potassium chloride  40 mEq Oral Q3H   Continuous Infusions:   LOS: 1 day    Time spent: 56 minutes spent on chart review, discussion with nursing staff, consultants, updating family and interview/physical exam; more than 50% of that time was spent in counseling and/or coordination of care.    Alvira Philips [place], DO [facility] Available via Epic secure chat 7am-7pm After these hours, please refer to coverage provider listed on [email or web address] 11/06/2022, 3:36 PM

## 2022-11-06 NOTE — Inpatient Diabetes Management (Signed)
Inpatient Diabetes Program Recommendations  AACE/ADA: New Consensus Statement on Inpatient Glycemic Control (2015)  Target Ranges:  Prepandial:   less than 140 mg/dL      Peak postprandial:   less than 180 mg/dL (1-2 hours)      Critically ill patients:  140 - 180 mg/dL   Lab Results  Component Value Date   GLUCAP 173 (H) 11/06/2022   HGBA1C 10.0 (H) 11/04/2022    Review of Glycemic Control  Diabetes history: DM2 - recently diagnosed Outpatient Diabetes medications: Tresiba 10 every day, metformini 500 mg BID, Ozempic 0.5 mg weekly Current orders for Inpatient glycemic control: Semglee 20 every day, Novolog 0-15 TID and 0-5 HS + 5 units TID  HgbA1C - 10.0%  Recently on Prednisone 40 mg every day x 5 days in June 2024  Transitioned off IV insulin.  Inpatient Diabetes Program Recommendations:    Consider increasing Semglee to 25 units every day  Consider increasing Novolog to 8 units TID with meals  Spoke with patient about new diabetes diagnosis.  Discussed A1C and explained what an A1C is. Lab results pending. Discussed basic pathophysiology of DM Type 2, basic home care, importance of checking CBGs and maintaining good CBG control to prevent long-term and short-term complications. Reviewed glucose and A1C goals. Reviewed signs and symptoms of hyperglycemia and hypoglycemia along with treatment for both. Discussed impact of nutrition, exercise, stress, sickness, and medications on diabetes control. Reviewed Living Well with diabetes booklet and encouraged patient to read through entire book. Educated patient on insulin pen use at home. Reviewed contents of insulin flexpen starter kit. Reviewed all steps if insulin pen including attachment of needle, 2-unit air shot, dialing up dose, giving injection, removing needle, disposal of sharps, storage of unused insulin, disposal of insulin etc. Patient able to provide successful return demonstration. Also reviewed troubleshooting with  insulin pen. MD to give patient Rxs for insulin pens and insulin pen needles.  Pt seems very motivated to take care of her diabetes. States she will be going to the gym and wants to get back in shape. Long discussion regarding healthy eating, the plate method and using portion control. Pt states she's ready to make these changes. Will give Libre samples to take home for monitoring blood sugars. Will also need meter and supplies. Mother assisting with making appt with PCP for pt to see after discharge. All questions answered.  Thank you. Ailene Ards, RD, LDN, CDCES Inpatient Diabetes Coordinator [phone]

## 2022-11-06 NOTE — Progress Notes (Signed)
Per unit RN pt now has sufficient PIV access.

## 2022-11-07 DIAGNOSIS — E1110 Type 2 diabetes mellitus with ketoacidosis without coma: Secondary | ICD-10-CM | POA: Diagnosis not present

## 2022-11-07 LAB — GLUCOSE, CAPILLARY
Glucose-Capillary: 232 mg/dL — ABNORMAL HIGH (ref 70–99)
Glucose-Capillary: 235 mg/dL — ABNORMAL HIGH (ref 70–99)
Glucose-Capillary: 216 mg/dL — ABNORMAL HIGH (ref 70–99)
Glucose-Capillary: 220 mg/dL — ABNORMAL HIGH (ref 70–99)

## 2022-11-07 LAB — BASIC METABOLIC PANEL
Anion gap: 11 (ref 5–15)
BUN: 8 mg/dL (ref 6–20)
CO2: 19 mmol/L — ABNORMAL LOW (ref 22–32)
Calcium: 8.8 mg/dL — ABNORMAL LOW (ref 8.9–10.3)
Chloride: 103 mmol/L (ref 98–111)
Creatinine, Ser: 0.75 mg/dL (ref 0.44–1.00)
GFR, Estimated: >60 mL/min (ref >60)
Glucose, Bld: 225 mg/dL — ABNORMAL HIGH (ref 70–99)
Potassium: 3.7 mmol/L (ref 3.5–5.1)
Sodium: 133 mmol/L — ABNORMAL LOW (ref 135–145)

## 2022-11-07 MED ORDER — INSULIN ASPART 100 UNIT/ML IJ SOLN
8.0000 [IU] | Freq: Three times a day (TID) | INTRAMUSCULAR | Status: DC
  Administered 2022-11-07 (×3): 8 [IU] via SUBCUTANEOUS

## 2022-11-07 MED ORDER — INSULIN GLARGINE-YFGN 100 UNIT/ML ~~LOC~~ SOLN
30.0000 [IU] | Freq: Every day | SUBCUTANEOUS | Status: DC
  Administered 2022-11-07: 30 [IU] via SUBCUTANEOUS
  Filled 2022-11-07 (×2): qty 0.3

## 2022-11-07 NOTE — Progress Notes (Signed)
PROGRESS NOTE    Erika [place] Meckler  NWG:[id number] DOB: 01-08-1996 DOA: 11/05/2022 PCP: [name], FNP    Brief Narrative:   Erika Frey is a 27 y.o. female with past medical history significant for morbid obesity, asthma, eczema, recently diagnosed with type 2 diabetes mellitus who presented to [facility] ED on 7/2 with fatigue, weakness, nausea/vomiting and decreased oral intake. Patient was recently discharged on metformin for new diagnosis of diabetes.  Since discharge, mother reports has been sleeping more frequently and now with nausea and vomiting.  Seen by her PCP day prior to admission and was started on Lantus 10 units daily.  Given her progressive symptoms, patient was brought to the ED for further evaluation and management.  In the ED, temperature 98.4 F, HR 126, RR 24, BP 118/95, SpO2 97% on room air.  WBC 16.0, hemoglobin 15.0, platelets 384.  Sodium 133, potassium 4.2, chloride 103, CO2 11, glucose 242, BUN 15, creatinine 1.03.  Anion gap 19.  Urinalysis with 80 ketones, greater than 500 glucose.  Patient was started on insulin drip, given IV fluid resuscitation.  TRH consulted for admission for further evaluation management of DKA  Assessment & Plan:   Diabetic ketoacidosis Type 2 diabetes mellitus Patient presenting to ED with progressive weakness, fatigue associated with nausea and vomiting.  Recently diagnosed with type 2 diabetes mellitus and started on metformin outpatient.  VBG with pH 7.19.  Glucose 242 with anion gap 19 on admission.  Initially started on insulin drip which has now been transitioned to subcutaneous insulin. -- Diabetic educator following, appreciate assistance -- Hemoglobin A1c 10.0 on 11/04/2022, poorly controlled -- Semglee 30u Nordic daily -- Novolog 8u TIDAC -- Moderate SSI for coverage -- CBG before every meal/at bedtime -- Consistent carbohydrate diet -- Nutrition consult  Morbid obesity Body mass index is 52.63 kg/m.  Discussed with  patient needs for aggressive lifestyle changes/weight loss as this complicates all facets of care.  Outpatient follow-up with PCP.      DVT prophylaxis: Lovenox    Code Status: Full Code Family Communication: Updated mother present at bedside this morning  Disposition Plan:  Level of care: Med-Surg Status is: Inpatient Remains inpatient appropriate because: Needs further titration of insulin regimen as Newstart, and better control of glucose before ready for discharge home, anticipate 1-2 days    Consultants:  None  Procedures:  None  Antimicrobials:  None   Subjective: Patient seen examined bedside, resting calmly.  Lying in bed.  Sleeping but arousable.  Family present.  Glucose improved but not optimally controlled.  Increasing Semglee and NovoLog doses today.  No other specific questions or concerns at this time.  Denies headache, no dizziness, no chest pain, no palpitations, no shortness of breath, no abdominal pain, no fever/chills/night sweats, no nausea/vomiting/diarrhea, no focal weakness, no paresthesias.  No acute events overnight per nursing staff.  Objective: Vitals:   11/06/22 2204 11/07/22 0144 11/07/22 0536 11/07/22 1001  BP: 118/80 109/68 123/75 106/84  Pulse: (!) 106 98 89   Resp: 18 18 17 17   Temp: 98.1 F (36.7 C) 98.9 F (37.2 C) 98 F (36.7 C) 98.6 F (37 C)  TempSrc: Oral Oral Oral   SpO2: 100% 100% 98% 100%  Weight:      Height:        Intake/Output Summary (Last 24 hours) at 11/07/2022 1111 Last data filed at 11/07/2022 1006 Gross per 24 hour  Intake 1336.54 ml  Output --  Net 1336.54 ml  Filed Weights   11/05/22 1501  Weight: (!) 152.4 kg    Examination:  Physical Exam: GEN: NAD, alert and oriented x 3, obese HEENT: NCAT, PERRL, EOMI, sclera clear, MMM PULM: CTAB w/o wheezes/crackles, normal respiratory effort, on room air CV: RRR w/o M/G/R GI: abd soft, NTND, NABS, no R/G/M MSK: no peripheral edema, muscle strength globally  intact 5/5 bilateral upper/lower extremities NEURO: CN II-XII intact, no focal deficits, sensation to light touch intact PSYCH: normal mood/affect Integumentary: dry/intact, no rashes or wounds    Data Reviewed: I have personally reviewed following labs and imaging studies  CBC: Recent Labs  Lab 11/03/22 0140 11/03/22 0156 11/03/22 0419 11/05/22 1522 11/06/22 0456  WBC  --  14.1*  --  16.0* 14.3*  HGB 15.3* 13.8 14.6 15.0 13.7  HCT 45.0 41.4 43.0 47.0* 42.8  MCV  --  74.7*  --  75.9* 77.0*  PLT  --  379  --  384 329   Basic Metabolic Panel: Recent Labs  Lab 11/06/22 0018 11/06/22 0456 11/06/22 0824 11/06/22 1208 11/07/22 0331  NA 133* 134* 136 138 133*  K 4.0 3.6 3.3* 3.2* 3.7  CL 104 106 107 107 103  CO2 16* 16* 16* 19* 19*  GLUCOSE 190* 179* 183* 182* 225*  BUN 11 10 8 8 8   CREATININE 0.85 0.89 0.77 0.84 0.75  CALCIUM 9.1 9.0 8.8* 9.2 8.8*  MG  --  2.2  --   --   --   PHOS  --  2.5  --   --   --    GFR: Estimated Creatinine Clearance: 163.3 mL/min (by C-G formula based on SCr of 0.75 mg/dL). Liver Function Tests: No results for input(s): "AST", "ALT", "ALKPHOS", "BILITOT", "PROT", "ALBUMIN" in the last 168 hours. No results for input(s): "LIPASE", "AMYLASE" in the last 168 hours. No results for input(s): "AMMONIA" in the last 168 hours. Coagulation Profile: No results for input(s): "INR", "PROTIME" in the last 168 hours. Cardiac Enzymes: No results for input(s): "CKTOTAL", "CKMB", "CKMBINDEX", "TROPONINI" in the last 168 hours. BNP (last 3 results) No results for input(s): "PROBNP" in the last 8760 hours. HbA1C: Recent Labs    11/04/22 1728  HGBA1C 10.0*   CBG: Recent Labs  Lab 11/06/22 1118 11/06/22 1306 11/06/22 1731 11/06/22 2145 11/07/22 0747  GLUCAP 205* 155* 233* 214* 232*   Lipid Profile: No results for input(s): "CHOL", "HDL", "LDLCALC", "TRIG", "CHOLHDL", "LDLDIRECT" in the last 72 hours. Thyroid Function Tests: No results for  input(s): "TSH", "T4TOTAL", "FREET4", "T3FREE", "THYROIDAB" in the last 72 hours. Anemia Panel: No results for input(s): "VITAMINB12", "FOLATE", "FERRITIN", "TIBC", "IRON", "RETICCTPCT" in the last 72 hours. Sepsis Labs: No results for input(s): "PROCALCITON", "LATICACIDVEN" in the last 168 hours.  Recent Results (from the past 240 hour(s))  MRSA Next Gen by PCR, Nasal     Status: None   Collection Time: 11/05/22 11:37 PM   Specimen: Nasal Mucosa; Nasal Swab  Result Value Ref Range Status   MRSA by PCR Next Gen NOT DETECTED NOT DETECTED Final    Comment: (NOTE) The GeneXpert MRSA Assay (FDA approved for NASAL specimens only), is one component of a comprehensive MRSA colonization surveillance program. It is not intended to diagnose MRSA infection nor to guide or monitor treatment for MRSA infections. Test performance is not FDA approved in patients less than 34 years old. Performed at [facility], [address], [place], [place] [address]  Radiology Studies: No results found.      Scheduled Meds:  Chlorhexidine Gluconate Cloth  6 each Topical Q0600   enoxaparin (LOVENOX) injection  75 mg Subcutaneous Q24H   insulin aspart  0-15 Units Subcutaneous TID WC   insulin aspart  0-5 Units Subcutaneous QHS   insulin aspart  8 Units Subcutaneous TID WC   insulin glargine-yfgn  30 Units Subcutaneous Daily   Continuous Infusions:   LOS: 2 days    Time spent: 51 minutes spent on chart review, discussion with nursing staff, consultants, updating family and interview/physical exam; more than 50% of that time was spent in counseling and/or coordination of care.    Alvira Philips [place], DO [facility] Available via Epic secure chat 7am-7pm After these hours, please refer to coverage provider listed on [email or web address] 11/07/2022, 11:11 AM

## 2022-11-07 NOTE — Care Management Important Message (Signed)
Important Message  Patient Details IM Letter given. Name: [place]mer MRN: [id number] Date of Birth: 02/27/1996   Medicare Important Message Given:  Yes     Caren Macadam 11/07/2022, 2:04 PM

## 2022-11-07 NOTE — Evaluation (Signed)
Physical Therapy Evaluation Patient Details Name: Erika Frey MRN: [id number] DOB: 31-Dec-1995 Today's Date: 11/07/2022  History of Present Illness  27 yo female patient who was discharged from the hospital 5 days prior to this admissions after being dx of  new onset diabetes mellitus.  pt re-admitted d/t  decr LOC and vomiting, dx with DKA. PMH: exotropia L eye and surgery x2, asthma, foot surgery  Clinical Impression  Patient evaluated by Physical Therapy with no further acute PT needs identified. All education has been completed and the patient has no further questions.  Pt is independent with mobility; reviewed activity and lifestyle changes, monitoring HR/target HR when exercising, RPE etc; pt states she has a new membership to the [facility]. Encouraged pt to utilize her apple watch and it's features.   See below for any follow-up Physical Therapy or equipment needs. PT is signing off. Thank you for this referral.         Assistance Recommended at Discharge None  If plan is discharge home, recommend the following:  Can travel by private vehicle           Equipment Recommendations None recommended by PT  Recommendations for Other Services       Functional Status Assessment Patient has not had a recent decline in their functional status     Precautions / Restrictions Precautions Precautions: Fall Restrictions Weight Bearing Restrictions: No      Mobility  Bed Mobility               General bed mobility comments: NT- pt reports no difficulty    Transfers Overall transfer level: Needs assistance Equipment used: Rolling walker (2 wheels) Transfers: Sit to/from Stand Sit to Stand: Modified independent (Device/Increase time), Independent                Ambulation/Gait Ambulation/Gait assistance: Independent, Modified independent (Device/Increase time) Gait Distance (Feet): 360 Feet   Gait Pattern/deviations: WFL(Within Functional Limits)           Stairs            Wheelchair Mobility     Tilt Bed    Modified Rankin (Stroke Patients Only)       Balance Overall balance assessment: Independent                                           Pertinent Vitals/Pain Pain Assessment Pain Assessment: No/denies pain    Home Living Family/patient expects to be discharged to:: Private residence Living Arrangements: Parent     Home Access: Level entry       Home Layout: One level Home Equipment: None      Prior Function Prior Level of Function : Independent/Modified Independent                     Hand Dominance        Extremity/Trunk Assessment   Upper Extremity Assessment Upper Extremity Assessment: Overall WFL for tasks assessed    Lower Extremity Assessment Lower Extremity Assessment: Overall WFL for tasks assessed       Communication   Communication: No difficulties  Cognition Arousal/Alertness: Awake/alert Behavior During Therapy: WFL for tasks assessed/performed Overall Cognitive Status: Within Functional Limits for tasks assessed  General Comments      Exercises     Assessment/Plan    PT Assessment Patient does not need any further PT services  PT Problem List         PT Treatment Interventions      PT Goals (Current goals can be found in the Care Plan section)  Acute Rehab PT Goals Patient Stated Goal: home soon, feel better PT Goal Formulation: All assessment and education complete, DC therapy    Frequency       Co-evaluation               AM-PAC PT "6 Clicks" Mobility  Outcome Measure Help needed turning from your back to your side while in a flat bed without using bedrails?: None Help needed moving from lying on your back to sitting on the side of a flat bed without using bedrails?: None Help needed moving to and from a bed to a chair (including a wheelchair)?: None Help needed  standing up from a chair using your arms (e.g., wheelchair or bedside chair)?: None Help needed to walk in hospital room?: None Help needed climbing 3-5 steps with a railing? : None 6 Click Score: 24    End of Session   Activity Tolerance: Patient tolerated treatment well Patient left: in chair;with call bell/phone within reach        Time: 1049-1111 PT Time Calculation (min) (ACUTE ONLY): 22 min   Charges:   PT Evaluation $PT Eval Low Complexity: 1 Low   PT General Charges $$ ACUTE PT VISIT: 1 Visit         Erika Frey, PT  Acute Rehab Dept ([facility]) [phone]  11/07/2022   [facility] 11/07/2022, 1:24 PM

## 2022-11-07 NOTE — Plan of Care (Signed)
  Problem: Education: Goal: Knowledge of General Education information will improve Description: Including pain rating scale, medication(s)/side effects and non-pharmacologic comfort measures Outcome: Progressing   Problem: Clinical Measurements: Goal: Ability to maintain clinical measurements within normal limits will improve Outcome: Progressing   Problem: Safety: Goal: Ability to remain free from injury will improve Outcome: Progressing   

## 2022-11-07 NOTE — Progress Notes (Signed)
   11/07/22 1148  TOC Brief Assessment  Insurance and Status Reviewed  Patient has primary care physician Yes  Home environment has been reviewed home with parents  Prior level of function: independent  Prior/Current Home Services No current home services  Social Determinants of Health Reivew SDOH reviewed no interventions necessary  Readmission risk has been reviewed Yes  Transition of care needs no transition of care needs at this time

## 2022-11-08 DIAGNOSIS — E1110 Type 2 diabetes mellitus with ketoacidosis without coma: Secondary | ICD-10-CM | POA: Diagnosis not present

## 2022-11-08 LAB — BASIC METABOLIC PANEL
Anion gap: 12 (ref 5–15)
BUN: 11 mg/dL (ref 6–20)
CO2: 19 mmol/L — ABNORMAL LOW (ref 22–32)
Calcium: 9.1 mg/dL (ref 8.9–10.3)
Chloride: 103 mmol/L (ref 98–111)
Creatinine, Ser: 0.77 mg/dL (ref 0.44–1.00)
GFR, Estimated: >60 mL/min (ref >60)
Glucose, Bld: 246 mg/dL — ABNORMAL HIGH (ref 70–99)
Potassium: 3.5 mmol/L (ref 3.5–5.1)
Sodium: 134 mmol/L — ABNORMAL LOW (ref 135–145)

## 2022-11-08 LAB — GLUCOSE, CAPILLARY: Glucose-Capillary: 249 mg/dL — ABNORMAL HIGH (ref 70–99)

## 2022-11-08 LAB — MAGNESIUM: Magnesium: 1.8 mg/dL (ref 1.7–2.4)

## 2022-11-08 MED ORDER — INSULIN GLARGINE-YFGN 100 UNIT/ML ~~LOC~~ SOLN
40.0000 [IU] | Freq: Every day | SUBCUTANEOUS | Status: DC
  Administered 2022-11-08: 40 [IU] via SUBCUTANEOUS
  Filled 2022-11-08: qty 0.4

## 2022-11-08 MED ORDER — INSULIN ASPART 100 UNIT/ML IJ SOLN
12.0000 [IU] | Freq: Three times a day (TID) | INTRAMUSCULAR | Status: DC
  Administered 2022-11-08: 12 [IU] via SUBCUTANEOUS

## 2022-11-08 MED ORDER — POTASSIUM CHLORIDE CRYS ER 20 MEQ PO TBCR
40.0000 meq | EXTENDED_RELEASE_TABLET | Freq: Once | ORAL | Status: AC
  Administered 2022-11-08: 40 meq via ORAL
  Filled 2022-11-08: qty 2

## 2022-11-08 MED ORDER — INSULIN LISPRO (1 UNIT DIAL) 100 UNIT/ML (KWIKPEN): 12.0000 [IU] | PEN_INJECTOR | Freq: Three times a day (TID) | SUBCUTANEOUS | 2 refills | Status: DC

## 2022-11-08 MED ORDER — INSULIN GLARGINE 100 UNIT/ML SOLOSTAR PEN: 40.0000 [IU] | PEN_INJECTOR | Freq: Every day | SUBCUTANEOUS | 2 refills | Status: DC

## 2022-11-08 NOTE — Plan of Care (Signed)
  Problem: Activity: Goal: Risk for activity intolerance will decrease Outcome: Progressing   Problem: Nutrition: Goal: Adequate nutrition will be maintained Outcome: Progressing   Problem: Safety: Goal: Ability to remain free from injury will improve Outcome: Progressing   

## 2022-11-08 NOTE — Inpatient Diabetes Management (Signed)
Recommendations for discharge:  Lantus 40 every day  Novolog 12 units TID  Libre 3 CGM  Supplies/Misc in Diabetes Coordination Discharge Recommendations  Reviewed all aspects of diabetes care, insulin pen administration, diet, monitoring, and importance of f/u with PCP. Answered all questions. Pt appears confident in managing blood sugars at home.   Will place Bryan 3 sample prior to discharge.  Thank you. Ailene Ards, RD, LDN, CDCES Inpatient Diabetes Coordinator [phone]

## 2022-11-08 NOTE — Plan of Care (Signed)
  Problem: Education: Goal: Knowledge of General Education information will improve Description: Including pain rating scale, medication(s)/side effects and non-pharmacologic comfort measures Outcome: Adequate for Discharge   Problem: Health Behavior/Discharge Planning: Goal: Ability to manage health-related needs will improve Outcome: Adequate for Discharge   Problem: Clinical Measurements: Goal: Ability to maintain clinical measurements within normal limits will improve Outcome: Adequate for Discharge Goal: Will remain free from infection Outcome: Adequate for Discharge Goal: Diagnostic test results will improve Outcome: Adequate for Discharge Goal: Respiratory complications will improve Outcome: Adequate for Discharge Goal: Cardiovascular complication will be avoided Outcome: Adequate for Discharge   Problem: Activity: Goal: Risk for activity intolerance will decrease 11/08/2022 1116 by Alice Rieger A, LPN Outcome: Adequate for Discharge 11/08/2022 0740 by Delila Spence, LPN Outcome: Progressing   Problem: Nutrition: Goal: Adequate nutrition will be maintained 11/08/2022 1116 by Delila Spence, LPN Outcome: Adequate for Discharge 11/08/2022 0740 by Delila Spence, LPN Outcome: Progressing   Problem: Coping: Goal: Level of anxiety will decrease Outcome: Adequate for Discharge   Problem: Elimination: Goal: Will not experience complications related to bowel motility Outcome: Adequate for Discharge Goal: Will not experience complications related to urinary retention Outcome: Adequate for Discharge   Problem: Pain Managment: Goal: General experience of comfort will improve Outcome: Adequate for Discharge   Problem: Safety: Goal: Ability to remain free from injury will improve 11/08/2022 1116 by Delila Spence, LPN Outcome: Adequate for Discharge 11/08/2022 0740 by Delila Spence, LPN Outcome: Progressing   Problem: Skin Integrity: Goal: Risk for impaired skin  integrity will decrease Outcome: Adequate for Discharge   Problem: Education: Goal: Ability to describe self-care measures that may prevent or decrease complications (Diabetes Survival Skills Education) will improve Outcome: Adequate for Discharge Goal: Individualized Educational Video(s) Outcome: Adequate for Discharge   Problem: Coping: Goal: Ability to adjust to condition or change in health will improve Outcome: Adequate for Discharge   Problem: Fluid Volume: Goal: Ability to maintain a balanced intake and output will improve Outcome: Adequate for Discharge   Problem: Health Behavior/Discharge Planning: Goal: Ability to identify and utilize available resources and services will improve Outcome: Adequate for Discharge Goal: Ability to manage health-related needs will improve Outcome: Adequate for Discharge   Problem: Metabolic: Goal: Ability to maintain appropriate glucose levels will improve Outcome: Adequate for Discharge   Problem: Nutritional: Goal: Maintenance of adequate nutrition will improve Outcome: Adequate for Discharge Goal: Progress toward achieving an optimal weight will improve Outcome: Adequate for Discharge   Problem: Skin Integrity: Goal: Risk for impaired skin integrity will decrease Outcome: Adequate for Discharge   Problem: Tissue Perfusion: Goal: Adequacy of tissue perfusion will improve Outcome: Adequate for Discharge

## 2022-11-08 NOTE — Discharge Summary (Signed)
Physician Discharge Summary  Erika [place] RUE:[id number] DOB: 06-Jul-1995 DOA: 11/05/2022  PCP: [name], FNP  Admit date: 11/05/2022 Discharge date: 11/08/2022  Admitted From: Home Disposition: Home  Recommendations for Outpatient Follow-up:  Follow up with PCP in 1-2 weeks Started on Lantus 40 units apparently daily, Humalog 12 units 3 times daily AC Patient encouraged to maintain glucose log to bring to next PCP visit, anticipate will likely need further titration of insulin regimen Continue to encourage dietary restrictions Recommends outpatient sleep study if not already accomplished for suspected sleep apnea given her body habitus  Home Health: No Equipment/Devices: Lifestyle libre glucose monitor  Discharge Condition: Stable CODE STATUS: Full code Diet recommendation: Consistent carbohydrate diet  History of present illness:  Erika Frey is a 27 y.o. female with past medical history significant for morbid obesity, asthma, eczema, recently diagnosed with type 2 diabetes mellitus who presented to [facility] ED on 7/2 with fatigue, weakness, nausea/vomiting and decreased oral intake. Patient was recently discharged on metformin for new diagnosis of diabetes.  Since discharge, mother reports has been sleeping more frequently and now with nausea and vomiting.  Seen by her PCP day prior to admission and was started on Lantus 10 units daily.  Given her progressive symptoms, patient was brought to the ED for further evaluation and management.   In the ED, temperature 98.4 F, HR 126, RR 24, BP 118/95, SpO2 97% on room air.  WBC 16.0, hemoglobin 15.0, platelets 384.  Sodium 133, potassium 4.2, chloride 103, CO2 11, glucose 242, BUN 15, creatinine 1.03.  Anion gap 19.  Urinalysis with 80 ketones, greater than 500 glucose.  Patient was started on insulin drip, given IV fluid resuscitation.  TRH consulted for admission for further evaluation management of DKA  Hospital  course:  Diabetic ketoacidosis Type 2 diabetes mellitus Patient presenting to ED with progressive weakness, fatigue associated with nausea and vomiting.  Recently diagnosed with type 2 diabetes mellitus and started on metformin outpatient.  VBG with pH 7.19.  Glucose 242 with anion gap 19 on admission.  Initially started on insulin drip which has now been transitioned to subcutaneous insulin.  Diabetic educator was consulted and followed during hospital course.  Dietitian was also consulted for instruction regarding dietary restrictions.  Hemoglobin A1c was 10.0 on 11/04/2022 correlating with very poorly controlled diabetes.  Patient's insulin was titrated up during hospitalization and will discharge on Lantus 40 units obviously daily, Humalog 12 units 3 times daily AC.  Patient encouraged to maintain glucose log to bring to next PCP visit for likely need of further insulin titration.  Continue to encourage carbohydrate restricted diet.   Morbid obesity Body mass index is 52.63 kg/m.  Discussed with patient needs for aggressive lifestyle changes/weight loss as this complicates all facets of care.  Outpatient follow-up with PCP.   Discharge Diagnoses:  Principal Problem:   DKA (diabetic ketoacidosis) (HCC) Active Problems:   Class 3 severe obesity due to excess calories with body mass index (BMI) of 45.0 to 49.9 in adult [facility])    Discharge Instructions  Discharge Instructions     Call MD for:  difficulty breathing, headache or visual disturbances   Complete by: As directed    Call MD for:  extreme fatigue   Complete by: As directed    Call MD for:  persistant dizziness or light-headedness   Complete by: As directed    Call MD for:  persistant nausea and vomiting   Complete by: As directed  Call MD for:  severe uncontrolled pain   Complete by: As directed    Call MD for:  temperature >100.4   Complete by: As directed    Diet - low sodium heart healthy   Complete by: As directed     Increase activity slowly   Complete by: As directed       Allergies as of 11/08/2022       Reactions   Penicillin G    Other reaction(s): Unknown   Penicillins Other (See Comments)   Stomach cramps        Medication List     STOP taking these medications    Tresiba FlexTouch 100 UNIT/ML FlexTouch Pen Generic drug: insulin degludec       TAKE these medications    albuterol 108 (90 Base) MCG/ACT inhaler Commonly known as: VENTOLIN HFA Inhale 2 puffs into the lungs every 6 (six) hours as needed.   Blood Glucose Monitoring Suppl Devi 1 each by Does not apply route in the morning, at noon, and at bedtime. May substitute to any manufacturer covered by patient's insurance.   BLOOD GLUCOSE TEST STRIPS Strp 1 each by In Vitro route in the morning, at noon, and at bedtime. May substitute to any manufacturer covered by patient's insurance.   cetirizine 10 MG tablet Commonly known as: ZYRTEC Take 1 tablet by mouth daily.   cyanocobalamin 100 MCG tablet Commonly known as: VITAMIN B12 Take 100 mcg by mouth daily.   fluticasone 50 MCG/ACT nasal spray Commonly known as: FLONASE Place 2 sprays into both nostrils daily.   insulin glargine 100 UNIT/ML Solostar Pen Commonly known as: LANTUS Inject 40 Units into the skin daily.   insulin lispro 100 UNIT/ML KwikPen Commonly known as: HUMALOG Inject 12 Units into the skin 3 (three) times daily.   Lancet Device Misc 1 each by Does not apply route in the morning, at noon, and at bedtime. May substitute to any manufacturer covered by patient's insurance.   Lancets Misc. Misc 1 each by Does not apply route in the morning, at noon, and at bedtime. May substitute to any manufacturer covered by patient's insurance.   montelukast 10 MG tablet Commonly known as: SINGULAIR Take 1 tablet (10 mg total) by mouth at bedtime.   multivitamin capsule Take 1 capsule by mouth daily.   NovoFine Plus Pen Needle 32G X 4 MM Misc Generic  drug: Insulin Pen Needle 1 each by Does not apply route daily.   Olopatadine HCl 0.2 % Soln Commonly known as: Pataday Place 1 drop into both eyes daily as needed.   ondansetron 4 MG tablet Commonly known as: Zofran Take 1 tablet (4 mg total) by mouth daily as needed for nausea or vomiting.   Opzelura 1.5 % Crea Generic drug: Ruxolitinib Phosphate Apply to affected areas twice a day as needed   Semaglutide(0.25 or 0.5MG /DOS) 2 MG/3ML Sopn Inject 0.5 mg into the skin once a week.   tacrolimus 0.1 % ointment Commonly known as: PROTOPIC APPLY TOPICALLY 2 TIMES A DAY TO LARGE BODY SURFACE AREA What changed: See the new instructions.   triamcinolone cream 0.1 % Commonly known as: KENALOG Apply 1 Application topically 2 (two) times daily.        Follow-up Information     [name], FNP. Schedule an appointment as soon as possible for a visit in 1 week(s).   Specialty: General Practice Contact information: [address] [place] [address] [phone]  Allergies  Allergen Reactions   Penicillin G     Other reaction(s): Unknown   Penicillins Other (See Comments)    Stomach cramps    Consultations: none   Procedures/Studies: No results found.   Subjective: Patient seen examined bedside, resting calmly.  Lying in bed.  Mother present.  Discharging home.  Encouraged to maintain glucose log to bring to next PCP visit within 1-2 weeks.  Anticipate likely need of further titration of insulin regimen.  No other specific complaints or concerns at this time.  Denies headache, no dizziness, no chest pain, no palpitations, no shortness of breath, no abdominal pain, no fever/chills/night sweats, no nausea cefonicid diarrhea, no focal weakness, no fatigue, no paresthesias.  No acute events overnight per nursing staff.  Discharge Exam: Vitals:   11/07/22 2202 11/08/22 0508  BP: 138/74 136/81  Pulse:  100  Resp: 17 17  Temp: 98.6 F  (37 C) 98.8 F (37.1 C)  SpO2: 99% 100%   Vitals:   11/07/22 1001 11/07/22 1344 11/07/22 2202 11/08/22 0508  BP: 106/84 135/85 138/74 136/81  Pulse:  (!) 106  100  Resp: 17 18 17 17   Temp: 98.6 F (37 C) 97.7 F (36.5 C) 98.6 F (37 C) 98.8 F (37.1 C)  TempSrc:      SpO2: 100% 97% 99% 100%  Weight:      Height:        Physical Exam: GEN: NAD, alert and oriented x 3, obese HEENT: NCAT, PERRL, EOMI, sclera clear, MMM PULM: CTAB w/o wheezes/crackles, normal respiratory effort, on room air CV: RRR w/o M/G/R GI: abd soft, NTND, NABS, no R/G/M MSK: no peripheral edema, muscle strength globally intact 5/5 bilateral upper/lower extremities NEURO: CN II-XII intact, no focal deficits, sensation to light touch intact PSYCH: normal mood/affect Integumentary: dry/intact, no rashes or wounds    The results of significant diagnostics from this hospitalization (including imaging, microbiology, ancillary and laboratory) are listed below for reference.     Microbiology: Recent Results (from the past 240 hour(s))  MRSA Next Gen by PCR, Nasal     Status: None   Collection Time: 11/05/22 11:37 PM   Specimen: Nasal Mucosa; Nasal Swab  Result Value Ref Range Status   MRSA by PCR Next Gen NOT DETECTED NOT DETECTED Final    Comment: (NOTE) The GeneXpert MRSA Assay (FDA approved for NASAL specimens only), is one component of a comprehensive MRSA colonization surveillance program. It is not intended to diagnose MRSA infection nor to guide or monitor treatment for MRSA infections. Test performance is not FDA approved in patients less than 48 years old. Performed at [facility], [address], [place], [place] [address]      Labs: BNP (last 3 results) No results for input(s): "BNP" in the last 8760 hours. Basic Metabolic Panel: Recent Labs  Lab 11/06/22 0456 11/06/22 0824 11/06/22 1208 11/07/22 0331 11/08/22 0330  NA 134* 136 138 133* 134*  K 3.6 3.3* 3.2*  3.7 3.5  CL 106 107 107 103 103  CO2 16* 16* 19* 19* 19*  GLUCOSE 179* 183* 182* 225* 246*  BUN 10 8 8 8 11   CREATININE 0.89 0.77 0.84 0.75 0.77  CALCIUM 9.0 8.8* 9.2 8.8* 9.1  MG 2.2  --   --   --  1.8  PHOS 2.5  --   --   --   --    Liver Function Tests: No results for input(s): "AST", "ALT", "ALKPHOS", "BILITOT", "PROT", "ALBUMIN" in  the last 168 hours. No results for input(s): "LIPASE", "AMYLASE" in the last 168 hours. No results for input(s): "AMMONIA" in the last 168 hours. CBC: Recent Labs  Lab 11/03/22 0140 11/03/22 0156 11/03/22 0419 11/05/22 1522 11/06/22 0456  WBC  --  14.1*  --  16.0* 14.3*  HGB 15.3* 13.8 14.6 15.0 13.7  HCT 45.0 41.4 43.0 47.0* 42.8  MCV  --  74.7*  --  75.9* 77.0*  PLT  --  379  --  384 329   Cardiac Enzymes: No results for input(s): "CKTOTAL", "CKMB", "CKMBINDEX", "TROPONINI" in the last 168 hours. BNP: Invalid input(s): "POCBNP" CBG: Recent Labs  Lab 11/07/22 0747 11/07/22 1148 11/07/22 1651 11/07/22 2201 11/08/22 0749  GLUCAP 232* 235* 216* 220* 249*   D-Dimer No results for input(s): "DDIMER" in the last 72 hours. Hgb A1c No results for input(s): "HGBA1C" in the last 72 hours. Lipid Profile No results for input(s): "CHOL", "HDL", "LDLCALC", "TRIG", "CHOLHDL", "LDLDIRECT" in the last 72 hours. Thyroid function studies No results for input(s): "TSH", "T4TOTAL", "T3FREE", "THYROIDAB" in the last 72 hours.  Invalid input(s): "FREET3" Anemia work up No results for input(s): "VITAMINB12", "FOLATE", "FERRITIN", "TIBC", "IRON", "RETICCTPCT" in the last 72 hours. Urinalysis    Component Value Date/Time   COLORURINE YELLOW 11/05/2022 1612   APPEARANCEUR CLEAR 11/05/2022 1612   LABSPEC 1.017 11/05/2022 1612   PHURINE 5.0 11/05/2022 1612   GLUCOSEU >=500 (A) 11/05/2022 1612   HGBUR NEGATIVE 11/05/2022 1612   BILIRUBINUR small (A) 11/05/2022 1727   BILIRUBINUR NEGATIVE 11/05/2022 1612   BILIRUBINUR negative 09/12/2020 1511    KETONESUR large (80) (A) 11/05/2022 1727   KETONESUR 80 (A) 11/05/2022 1612   PROTEINUR 30 (A) 11/05/2022 1612   UROBILINOGEN 0.2 11/05/2022 1727   NITRITE Negative 11/05/2022 1727   NITRITE NEGATIVE 11/05/2022 1612   LEUKOCYTESUR Negative 11/05/2022 1727   LEUKOCYTESUR NEGATIVE 11/05/2022 1612   Sepsis Labs Recent Labs  Lab 11/03/22 0156 11/05/22 1522 11/06/22 0456  WBC 14.1* 16.0* 14.3*   Microbiology Recent Results (from the past 240 hour(s))  MRSA Next Gen by PCR, Nasal     Status: None   Collection Time: 11/05/22 11:37 PM   Specimen: Nasal Mucosa; Nasal Swab  Result Value Ref Range Status   MRSA by PCR Next Gen NOT DETECTED NOT DETECTED Final    Comment: (NOTE) The GeneXpert MRSA Assay (FDA approved for NASAL specimens only), is one component of a comprehensive MRSA colonization surveillance program. It is not intended to diagnose MRSA infection nor to guide or monitor treatment for MRSA infections. Test performance is not FDA approved in patients less than 85 years old. Performed at [facility], [address] [place], [place] [address]      Time coordinating discharge: Over 30 minutes  SIGNED:   Alvira Philips [place], DO  [facility] 11/08/2022, 10:23 AM

## 2022-11-11 ENCOUNTER — Telehealth: Payer: Self-pay

## 2022-11-11 ENCOUNTER — Ambulatory Visit (INDEPENDENT_AMBULATORY_CARE_PROVIDER_SITE_OTHER): Payer: 59 | Admitting: Podiatry

## 2022-11-11 ENCOUNTER — Ambulatory Visit (INDEPENDENT_AMBULATORY_CARE_PROVIDER_SITE_OTHER): Payer: 59

## 2022-11-11 DIAGNOSIS — M79671 Pain in right foot: Secondary | ICD-10-CM | POA: Diagnosis not present

## 2022-11-11 DIAGNOSIS — M79672 Pain in left foot: Secondary | ICD-10-CM

## 2022-11-11 DIAGNOSIS — E0843 Diabetes mellitus due to underlying condition with diabetic autonomic (poly)neuropathy: Secondary | ICD-10-CM

## 2022-11-11 DIAGNOSIS — M2141 Flat foot [pes planus] (acquired), right foot: Secondary | ICD-10-CM

## 2022-11-11 DIAGNOSIS — M2142 Flat foot [pes planus] (acquired), left foot: Secondary | ICD-10-CM

## 2022-11-11 LAB — ANTI-ISLET CELL ANTIBODY: Pancreatic Islet Cell Antibody: NEGATIVE

## 2022-11-11 NOTE — Progress Notes (Signed)
Chief Complaint  Patient presents with   Foot Orthotics    Patient came in today for Diabetic shoes or orthotics , A1c- 10.0 BG- 179    HPI: 27 y.o. female PMHx recently diagnosed diabetes mellitus, A1c was 10.0 on 11/04/2022 as well as eczema and chronic bilateral foot pain presenting today for diabetic foot evaluation.  Patient states that she is now working closely with her PCP to manage her diabetes.  She also has a long history of chronic bilateral foot pain.  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  Past Medical History:  Diagnosis Date   Absent menses    MOTHER STATES PT STARTED PERIOD AT AGE 89 BUT LAST PERIOD WAS 2 YRS AGO , AGE 89   Asthma    Eczema    Exotropia of left eye    Immunizations up to date    Simple constipation    OCCASIONAL   Sinus headache    Urticaria     Past Surgical History:  Procedure Laterality Date   FOOT SURGERY  AGE 50   MEDIAN RECTUS REPAIR Left 12/16/2012   Procedure: MEDIAN RECTUS RESECTION LEFT EYE;  Surgeon: Corinda Gubler, MD;  Location: [facility];  Service: Ophthalmology;  Laterality: Left;   MUSCLE RECESSION AND RESECTION Left 12/16/2012   Procedure: LATERAL RECTUS RECESSION LEFT EYE;  Surgeon: Corinda Gubler, MD;  Location: [facility];  Service: Ophthalmology;  Laterality: Left;   TONSILLECTOMY AND ADENOIDECTOMY  AGE 29    Allergies  Allergen Reactions   Penicillin G     Other reaction(s): Unknown   Penicillins Other (See Comments)    Stomach cramps     Physical Exam: General: The patient is alert and oriented x3 in no acute distress.  Dermatology: Skin is warm, dry and supple bilateral lower extremities.  Hyperkeratotic skin lesions noted to the bilateral forefoot consistent with patient's given history of eczema.  No open wounds  Vascular: Palpable pedal pulses bilaterally. Capillary refill within normal limits.  No appreciable edema.  No erythema.  Neurological: Light  touch and protective threshold diminished  Musculoskeletal Exam: No pedal deformities noted  Radiographic Exam B/L feet 11/11/2022:  Normal osseous mineralization. Joint spaces preserved.  No fractures or osseous irregularities noted.  There are some mild arch collapse with collapse of the medial longitudinal arch of the foot and a decreased calcaneal inclination angle  Assessment/Plan of Care: 1.  Diabetes mellitus with peripheral polyneuropathy; uncontrolled 2.  Pes planus both 3.  History of Achilles tendon lengthening as a child bilateral  -Patient evaluated.   X-rays reviewed -Comprehensive diabetic foot exam performed today.  -Continue close management with PCP for diabetes -Advise against going barefoot.  Patient admits to walking around barefoot or only in socks around the house.  Recommend good supportive shoes and sneakers -Appointment with diabetic shoe department for diabetic shoes and custom molded Plastizote insoles -Return to clinic for diabetic shoe fitting.  Annually with me     Felecia Shelling, DPM [facility]  Dr. Felecia Shelling, DPM    [address], [place] [address]                Office (  [phone]  Fax [phone]

## 2022-11-11 NOTE — Transitions of Care (Post Inpatient/ED Visit) (Signed)
   11/11/2022  Name: [place] MRN: [id number] DOB: 07-Apr-1996  Today's TOC FU Call Status: Today's TOC FU Call Status:: Unsuccessul Call (1st Attempt) Unsuccessful Call (1st Attempt) Date: 11/11/22  Attempted to reach the patient regarding the most recent Inpatient/ED visit.  Follow Up Plan: Additional outreach attempts will be made to reach the patient to complete the Transitions of Care (Post Inpatient/ED visit) call.      Antionette Fairy, RN,BSN,CCM [facility] Care Management Care Management Community Coordinator Direct Phone: [phone] Toll Free: [phone] Fax: [phone]

## 2022-11-11 NOTE — Transitions of Care (Post Inpatient/ED Visit) (Signed)
11/11/2022  Name: [place] MRN: [id number] DOB: 1995/10/18  Today's TOC FU Call Status: Today's TOC FU Call Status:: Successful TOC FU Call Competed TOC FU Call Complete Date: 11/11/22  Transition Care Management Follow-up Telephone Call Date of Discharge: 11/08/22 Discharge Facility: [facility]) Type of Discharge: Inpatient Admission Primary Inpatient Discharge Diagnosis:: DKA How have you been since you were released from the hospital?: Better Any questions or concerns?: No  Items Reviewed: Did you receive and understand the discharge instructions provided?: Yes Medications obtained,verified, and reconciled?: Yes (Medications Reviewed) Any new allergies since your discharge?: No Dietary orders reviewed?: No Do you have support at home?: No  Medications Reviewed Today: Medications Reviewed Today     Reviewed by Card, Bryson Ha, CPhT (Pharmacy Technician) on 11/05/22 at 1845  Med List Status: Complete   Medication Order Taking? Sig Documenting Provider Last Dose Status Informant  albuterol (PROVENTIL HFA;VENTOLIN HFA) 108 (90 BASE) MCG/ACT inhaler [id number] No Inhale 2 puffs into the lungs every 6 (six) hours as needed. [provider] unknown Active Self, Pharmacy Records  Blood Glucose Monitoring Suppl DEVI [id number]  1 each by Does not apply route in the morning, at noon, and at bedtime. May substitute to any manufacturer covered by patient's insurance. Antony Madura, PA-C  Active Self, Pharmacy Records  cetirizine (ZYRTEC) 10 MG tablet [id number] Yes Take 1 tablet by mouth daily. [provider] Past Month Active Self, Pharmacy Records  cyanocobalamin (VITAMIN B12) 100 MCG tablet [id number] Yes Take 100 mcg by mouth daily. [provider] Past Month Active Self, Pharmacy Records  fluticasone [facility]) 50 MCG/ACT nasal spray [id number] Yes Place 2 sprays into both nostrils daily. [provider] Past Month Active Self, Pharmacy Records   Glucose Blood (BLOOD GLUCOSE TEST STRIPS) STRP [id number]  1 each by In Vitro route in the morning, at noon, and at bedtime. May substitute to any manufacturer covered by patient's insurance. Antony Madura, PA-C  Active Self, Pharmacy Records  insulin degludec [employer]) 100 UNIT/ML FlexTouch Pen [id number] Yes Inject 10 Units into the skin daily. [name], FNP 11/05/2022 Active Self, Pharmacy Records  Insulin Pen Needle (NOVOFINE PLUS PEN NEEDLE) 32G X 4 MM MISC [id number]  1 each by Does not apply route daily. [name], FNP  Active Self, Pharmacy Records  Lancet Device MISC [id number]  1 each by Does not apply route in the morning, at noon, and at bedtime. May substitute to any manufacturer covered by patient's insurance. Antony Madura, PA-C  Active Self, Pharmacy Records  Lancets Misc. MISC [id number]  1 each by Does not apply route in the morning, at noon, and at bedtime. May substitute to any manufacturer covered by patient's insurance. Antony Madura, PA-C  Active Self, Pharmacy Records  montelukast (SINGULAIR) 10 MG tablet [id number] Yes Take 1 tablet (10 mg total) by mouth at bedtime. Marcelyn Bruins, MD Past Month Active Self, Pharmacy Records  Multiple Vitamin (MULTIVITAMIN) capsule [id number] Yes Take 1 capsule by mouth daily. [provider] Past Week Active Self, Pharmacy Records  Olopatadine HCl (PATADAY) 0.2 % SOLN [id number] Yes Place 1 drop into both eyes daily as needed. Marcelyn Bruins, MD Past Week Active Self, Pharmacy Records  ondansetron [facility]) 4 MG tablet [id number] Yes Take 1 tablet (4 mg total) by mouth daily as needed for nausea or vomiting. [name], FNP 11/05/2022 Active Self, Pharmacy Records  Ruxolitinib Phosphate (OPZELURA) 1.5 % CREA [id number] Yes Apply to affected areas twice a day as needed Ezel,  Pilar Grammes, MD 11/04/2022 Active Self, Pharmacy Records  Semaglutide,0.25 or 0.5MG /DOS, 2 MG/3ML SOPN [id number] Yes Inject 0.5 mg  into the skin once a week. [name], FNP 11/04/2022 Active Self, Pharmacy Records  tacrolimus (PROTOPIC) 0.1 % ointment [id number] Yes APPLY TOPICALLY 2 TIMES A DAY TO LARGE BODY SURFACE AREA  Patient taking differently: Apply 1 Application topically 2 (two) times daily. 1   Sheffield, Kelli R, [place] Past Week Active Self, Pharmacy Records  triamcinolone cream (KENALOG) 0.1 % [id number] Yes Apply 1 Application topically 2 (two) times daily. [provider] Past Week Active Self, Pharmacy Records            Home Care and Equipment/Supplies: Were Home Health Services Ordered?: No Any new equipment or medical supplies ordered?: No  Functional Questionnaire: Do you need assistance with bathing/showering or dressing?: No Do you need assistance with meal preparation?: No Do you need assistance with eating?: No Do you have difficulty maintaining continence: No Do you need assistance with getting out of bed/getting out of a chair/moving?: No Do you have difficulty managing or taking your medications?: No  Follow up appointments reviewed: PCP Follow-up appointment confirmed?: Yes Date of PCP follow-up appointment?: 11/13/22 Specialist Hospital Follow-up appointment confirmed?: No Do you need transportation to your follow-up appointment?: No Do you understand care options if your condition(s) worsen?: Yes-patient verbalized understanding    SIGNATURE Lisabeth Devoid, CMA

## 2022-11-13 ENCOUNTER — Ambulatory Visit (INDEPENDENT_AMBULATORY_CARE_PROVIDER_SITE_OTHER): Payer: 59 | Admitting: Family Medicine

## 2022-11-13 ENCOUNTER — Encounter: Payer: Self-pay | Admitting: Family Medicine

## 2022-11-13 VITALS — BP 120/82 | HR 99 | Temp 98.5°F | Ht 67.0 in | Wt 347.4 lb

## 2022-11-13 DIAGNOSIS — Z6841 Body Mass Index (BMI) 40.0 and over, adult: Secondary | ICD-10-CM

## 2022-11-13 DIAGNOSIS — E1165 Type 2 diabetes mellitus with hyperglycemia: Secondary | ICD-10-CM | POA: Diagnosis not present

## 2022-11-13 DIAGNOSIS — E1110 Type 2 diabetes mellitus with ketoacidosis without coma: Secondary | ICD-10-CM

## 2022-11-13 DIAGNOSIS — Z794 Long term (current) use of insulin: Secondary | ICD-10-CM

## 2022-11-13 DIAGNOSIS — E860 Dehydration: Secondary | ICD-10-CM

## 2022-11-13 NOTE — Patient Instructions (Signed)

## 2022-11-13 NOTE — Progress Notes (Signed)
I,[name], CMA,acting as a Neurosurgeon for [employer], NP.,have documented all relevant documentation on the behalf of Tram Wrenn, NP,as directed by  Kathyann Spaugh Moshe Salisbury, NP while in the presence of Rowen Wilmer, NP.  Subjective:  Patient ID: Erika Frey , female    DOB: Oct 17, 1995 , 27 y.o.   MRN: [id number]  Chief Complaint  Patient presents with   hospital f/u    HPI  Patient presents today for a ER follow up. She went to the ER on 11/05/22 for hyperglycemia where she was admitted for diabetic ketoacidosis and she was discharged on 11/08/22. Patient is in the office today with her mother, and she states she is feeling so much better. She was discharged with a Dexcom glucose monitor on her upper arm that is connected to her phone. Patient states she has been monitoring her BS closely and has reduced her concentrated sweets,breads from her meal plan. Patient has a meeting with the Diabetes Educator today for teaching on diet and medication use,storage.     Past Medical History:  Diagnosis Date   Absent menses    MOTHER STATES PT STARTED PERIOD AT AGE 83 BUT LAST PERIOD WAS 2 YRS AGO , AGE 53   Asthma    Eczema    Exotropia of left eye    Immunizations up to date    Simple constipation    OCCASIONAL   Sinus headache    Urticaria      Family History  Problem Relation Age of Onset   Hypertension Mother    Diabetes Mother    Heart disease Mother    Sleep apnea Mother    Hypertension Maternal Grandmother    Heart disease Maternal Grandmother    Sleep apnea Other      Current Outpatient Medications:    albuterol (PROVENTIL HFA;VENTOLIN HFA) 108 (90 BASE) MCG/ACT inhaler, Inhale 2 puffs into the lungs every 6 (six) hours as needed., Disp: , Rfl:    Blood Glucose Monitoring Suppl DEVI, 1 each by Does not apply route in the morning, at noon, and at bedtime. May substitute to any manufacturer covered by patient's insurance., Disp: 1 each, Rfl: 0   cetirizine (ZYRTEC) 10 MG tablet,  Take 1 tablet by mouth daily., Disp: , Rfl:    cyanocobalamin (VITAMIN B12) 100 MCG tablet, Take 100 mcg by mouth daily., Disp: , Rfl:    fluticasone (FLONASE) 50 MCG/ACT nasal spray, Place 2 sprays into both nostrils daily., Disp: , Rfl:    Glucose Blood (BLOOD GLUCOSE TEST STRIPS) STRP, 1 each by In Vitro route in the morning, at noon, and at bedtime. May substitute to any manufacturer covered by patient's insurance., Disp: 100 strip, Rfl: 0   insulin glargine (LANTUS) 100 UNIT/ML Solostar Pen, Inject 40 Units into the skin daily., Disp: 15 mL, Rfl: 2   insulin lispro (HUMALOG) 100 UNIT/ML KwikPen, Inject 12 Units into the skin 3 (three) times daily., Disp: 15 mL, Rfl: 2   Insulin Pen Needle (NOVOFINE PLUS PEN NEEDLE) 32G X 4 MM MISC, 1 each by Does not apply route daily., Disp: 100 each, Rfl: 3   Lancet Device MISC, 1 each by Does not apply route in the morning, at noon, and at bedtime. May substitute to any manufacturer covered by patient's insurance., Disp: 1 each, Rfl: 0   Lancets Misc. MISC, 1 each by Does not apply route in the morning, at noon, and at bedtime. May substitute to any manufacturer covered by patient's insurance., Disp: 100  each, Rfl: 0   montelukast (SINGULAIR) 10 MG tablet, Take 1 tablet (10 mg total) by mouth at bedtime., Disp: 30 tablet, Rfl: 5   Multiple Vitamin (MULTIVITAMIN) capsule, Take 1 capsule by mouth daily., Disp: , Rfl:    Olopatadine HCl (PATADAY) 0.2 % SOLN, Place 1 drop into both eyes daily as needed., Disp: 2.5 mL, Rfl: 5   ondansetron (ZOFRAN) 4 MG tablet, Take 1 tablet (4 mg total) by mouth daily as needed for nausea or vomiting., Disp: 30 tablet, Rfl: 1   Ruxolitinib Phosphate (OPZELURA) 1.5 % CREA, Apply to affected areas twice a day as needed, Disp: 60 g, Rfl: 5   Semaglutide,0.25 or 0.5MG /DOS, 2 MG/3ML SOPN, Inject 0.5 mg into the skin once a week., Disp: 9 mL, Rfl: 1   tacrolimus (PROTOPIC) 0.1 % ointment, APPLY TOPICALLY 2 TIMES A DAY TO LARGE BODY  SURFACE AREA (Patient taking differently: Apply 1 Application topically 2 (two) times daily. 1), Disp: 60 g, Rfl: 2   triamcinolone cream (KENALOG) 0.1 %, Apply 1 Application topically 2 (two) times daily., Disp: , Rfl:    Allergies  Allergen Reactions   Penicillin G     Other reaction(s): Unknown   Penicillins Other (See Comments)    Stomach cramps     Review of Systems  Constitutional: Negative.   Eyes: Negative.   Respiratory: Negative.    Endocrine: Negative for polydipsia, polyphagia and polyuria.  Musculoskeletal: Negative.   Skin: Negative.   Neurological: Negative.   Psychiatric/Behavioral: Negative.       Today's Vitals   11/13/22 1428  BP: 120/82  Pulse: 99  Temp: 98.5 F (36.9 C)  Weight: (!) 347 lb 6.4 oz (157.6 kg)  Height: 5\' 7"  (1.702 m)  PainSc: 0-No pain   Body mass index is 54.41 kg/m.  Wt Readings from Last 3 Encounters:  11/13/22 (!) 347 lb 6.4 oz (157.6 kg)  11/05/22 (!) 336 lb (152.4 kg)  11/04/22 (!) 336 lb (152.4 kg)     Objective:  Physical Exam Cardiovascular:     Rate and Rhythm: Normal rate and regular rhythm.  Pulmonary:     Effort: Pulmonary effort is normal.     Breath sounds: Normal breath sounds.  Skin:    General: Skin is warm and dry.  Neurological:     Mental Status: She is alert.  Psychiatric:        Mood and Affect: Mood normal.         Assessment And Plan:  Type 2 diabetes mellitus with hyperglycemia, with long-term current use of insulin (HCC)  Class 3 severe obesity due to excess calories without serious comorbidity with body mass index (BMI) of 50.0 to 59.9 in adult [facility])     Return for keep next appt as scheduled.  Patient was given opportunity to ask questions. Patient verbalized understanding of the plan and was able to repeat key elements of the plan. All questions were answered to their satisfaction.  Tonga Prout Moshe Salisbury, NP  I, Cesareo Vickrey Moshe Salisbury, NP, have reviewed all documentation for this visit. The documentation  on 11/21/22 for the exam, diagnosis, procedures, and orders are all accurate and complete.   IF YOU HAVE BEEN REFERRED TO A SPECIALIST, IT MAY TAKE 1-2 WEEKS TO SCHEDULE/PROCESS THE REFERRAL. IF YOU HAVE NOT HEARD FROM US/SPECIALIST IN TWO WEEKS, PLEASE GIVE [place] A CALL AT [phone].   THE PATIENT IS ENCOURAGED TO PRACTICE SOCIAL DISTANCING DUE TO THE COVID-19 PANDEMIC.

## 2022-11-19 DIAGNOSIS — R112 Nausea with vomiting, unspecified: Secondary | ICD-10-CM | POA: Insufficient documentation

## 2022-11-19 NOTE — Assessment & Plan Note (Signed)
Phenergan 25 mg given in office IM.  Her mother is driving today.  Also given Zofran to take at home.  Encouraged to do clear liquids

## 2022-11-19 NOTE — Assessment & Plan Note (Addendum)
This is a new diagnosis.  Blood sugar was up 600s.  She is to start Ozempic first dose was given in the office.  She will also start Tresiba 10 units daily.  Will set her up with diabetic educator Genelle.  During office visit she had vomiting Phenergan given.  Given Dexcom and applied while in office.

## 2022-11-21 ENCOUNTER — Encounter: Payer: Self-pay | Admitting: Family Medicine

## 2022-11-21 NOTE — Assessment & Plan Note (Signed)
Reduce Humalog insulin to 5 units TID with meals

## 2022-11-21 NOTE — Assessment & Plan Note (Signed)
She is encouraged to strive for BMI less than 30 to decrease cardiac risk. Advised to aim for at least 150 minutes of exercise per week.  

## 2022-11-23 ENCOUNTER — Other Ambulatory Visit: Payer: Self-pay | Admitting: Nurse Practitioner

## 2022-11-26 ENCOUNTER — Other Ambulatory Visit: Payer: Self-pay

## 2022-11-26 DIAGNOSIS — Z794 Long term (current) use of insulin: Secondary | ICD-10-CM

## 2022-11-26 MED ORDER — FREESTYLE LIBRE 3 SENSOR MISC
1.0000 | 2 refills | Status: DC
Start: 2022-11-26 — End: 2022-11-27

## 2022-11-27 ENCOUNTER — Other Ambulatory Visit: Payer: Self-pay

## 2022-11-27 DIAGNOSIS — Z794 Long term (current) use of insulin: Secondary | ICD-10-CM

## 2022-11-27 MED ORDER — FREESTYLE LIBRE 3 SENSOR MISC
1.0000 | 2 refills | Status: DC
Start: 2022-11-27 — End: 2022-12-24

## 2022-12-04 ENCOUNTER — Ambulatory Visit: Payer: 59

## 2022-12-04 NOTE — Progress Notes (Signed)
Patient presents to the office today for diabetic shoe and insole measuring.  Patient was measured with brannock device to determine size and width for 1 pair of extra depth shoes and foam casted for 3 pair of insoles.   Documentation of medical necessity will be sent to patient's treating diabetic doctor to verify and sign.   Patient's diabetic provider: Dr Copen Bores PCP: Fleet Contras MD   Shoes and insoles will be ordered at that time and patient will be notified for an appointment for fitting when they arrive.   Shoe size (per patient): 11   Brannock measurement: 11.5  Patient shoe selection-   Shoe choice:   [id number]  Shoe size ordered: 12 2E  Items to be fit when in ABN signed  Erika Frey Cped, CFo, CFm

## 2022-12-24 ENCOUNTER — Ambulatory Visit (INDEPENDENT_AMBULATORY_CARE_PROVIDER_SITE_OTHER): Payer: 59 | Admitting: Nurse Practitioner

## 2022-12-24 ENCOUNTER — Encounter: Payer: Self-pay | Admitting: Nurse Practitioner

## 2022-12-24 VITALS — BP 120/70 | HR 93 | Temp 98.4°F | Ht 67.0 in | Wt 332.0 lb

## 2022-12-24 DIAGNOSIS — M79671 Pain in right foot: Secondary | ICD-10-CM | POA: Diagnosis not present

## 2022-12-24 DIAGNOSIS — E1165 Type 2 diabetes mellitus with hyperglycemia: Secondary | ICD-10-CM | POA: Diagnosis not present

## 2022-12-24 DIAGNOSIS — R21 Rash and other nonspecific skin eruption: Secondary | ICD-10-CM | POA: Diagnosis not present

## 2022-12-24 DIAGNOSIS — M79672 Pain in left foot: Secondary | ICD-10-CM

## 2022-12-24 DIAGNOSIS — Z6841 Body Mass Index (BMI) 40.0 and over, adult: Secondary | ICD-10-CM

## 2022-12-24 DIAGNOSIS — Z794 Long term (current) use of insulin: Secondary | ICD-10-CM | POA: Diagnosis not present

## 2022-12-24 MED ORDER — IBUPROFEN 600 MG PO TABS: 600.0000 mg | ORAL_TABLET | Freq: Four times a day (QID) | ORAL | 2 refills | Status: DC | PRN

## 2022-12-24 MED ORDER — GVOKE HYPOPEN 2-PACK 0.5 MG/0.1ML ~~LOC~~ SOAJ: 0.1000 mL | SUBCUTANEOUS | 3 refills | Status: AC | PRN

## 2022-12-24 MED ORDER — INSULIN GLARGINE 100 UNIT/ML SOLOSTAR PEN
10.0000 [IU] | PEN_INJECTOR | Freq: Every day | SUBCUTANEOUS | Status: DC
Start: 2022-12-24 — End: 2023-09-11

## 2022-12-24 MED ORDER — FREESTYLE LIBRE 3 SENSOR MISC: 1.0000 | 2 refills | Status: DC

## 2022-12-24 MED ORDER — TOLNAFTATE 1 % EX AERP
INHALATION_SPRAY | Freq: Two times a day (BID) | CUTANEOUS | 1 refills | Status: AC
Start: 2022-12-24 — End: ?

## 2022-12-24 NOTE — Progress Notes (Signed)
[name], CMA,acting as a Neurosurgeon for [name], FNP.,have documented all relevant documentation on the behalf of [name], FNP,as directed by  [name], FNP while in the presence of [name], FNP.  Subjective:  Patient ID: Erika Frey , female    DOB: 25-Jan-1996 , 27 y.o.   MRN: [id number]  Chief Complaint  Patient presents with   Diabetes    HPI  Patient presents today for therapeutic shoes, Patient reports compliance with medications. Patient denies any chest pain, SOB, or headaches. Patient reports her sugars have dropping throughout the night, patient admits to not eating as much. Patient reports she needs a refill on ibuprofen. She is no longer taking Humalog. Sunday morning at 10a it dropped to 49. She is not eating as much since taking the Ozempic.   BP Readings from Last 3 Encounters: 12/24/22 : 120/70 11/13/22 : 120/82 11/08/22 : 136/81       Past Medical History:  Diagnosis Date   Absent menses    MOTHER STATES PT STARTED PERIOD AT AGE 60 BUT LAST PERIOD WAS 2 YRS AGO , AGE 23   Asthma    Eczema    Exotropia of left eye    Immunizations up to date    Simple constipation    OCCASIONAL   Sinus headache    Urticaria      Family History  Problem Relation Age of Onset   Hypertension Mother    Diabetes Mother    Heart disease Mother    Sleep apnea Mother    Hypertension Maternal Grandmother    Heart disease Maternal Grandmother    Sleep apnea Other      Current Outpatient Medications:    albuterol (PROVENTIL HFA;VENTOLIN HFA) 108 (90 BASE) MCG/ACT inhaler, Inhale 2 puffs into the lungs every 6 (six) hours as needed., Disp: , Rfl:    Blood Glucose Monitoring Suppl DEVI, 1 each by Does not apply route in the morning, at noon, and at bedtime. May substitute to any manufacturer covered by patient's insurance., Disp: 1 each, Rfl: 0   cetirizine (ZYRTEC) 10 MG tablet, Take 1 tablet by mouth daily., Disp: , Rfl:    cyanocobalamin (VITAMIN B12)  100 MCG tablet, Take 100 mcg by mouth daily., Disp: , Rfl:    fluticasone (FLONASE) 50 MCG/ACT nasal spray, Place 2 sprays into both nostrils daily., Disp: , Rfl:    Glucagon (GVOKE HYPOPEN 2-PACK) 0.5 MG/0.1ML SOAJ, Inject 0.1 mLs into the skin as needed., Disp: 0.2 mL, Rfl: 3   Insulin Pen Needle (NOVOFINE PLUS PEN NEEDLE) 32G X 4 MM MISC, 1 each by Does not apply route daily., Disp: 100 each, Rfl: 3   montelukast (SINGULAIR) 10 MG tablet, Take 1 tablet (10 mg total) by mouth at bedtime., Disp: 30 tablet, Rfl: 5   Multiple Vitamin (MULTIVITAMIN) capsule, Take 1 capsule by mouth daily., Disp: , Rfl:    Olopatadine HCl (PATADAY) 0.2 % SOLN, Place 1 drop into both eyes daily as needed., Disp: 2.5 mL, Rfl: 5   ondansetron (ZOFRAN) 4 MG tablet, Take 1 tablet (4 mg total) by mouth daily as needed for nausea or vomiting., Disp: 30 tablet, Rfl: 1   Ruxolitinib Phosphate (OPZELURA) 1.5 % CREA, Apply to affected areas twice a day as needed, Disp: 60 g, Rfl: 5   Semaglutide,0.25 or 0.5MG /DOS, 2 MG/3ML SOPN, Inject 0.5 mg into the skin once a week., Disp: 9 mL, Rfl: 1   tacrolimus (PROTOPIC) 0.1 % ointment, APPLY TOPICALLY  2 TIMES A DAY TO LARGE BODY SURFACE AREA (Patient taking differently: Apply 1 Application topically 2 (two) times daily. 1), Disp: 60 g, Rfl: 2   tolnaftate (TINACTIN) 1 % spray, Apply topically 2 (two) times daily., Disp: 130 g, Rfl: 1   triamcinolone cream (KENALOG) 0.1 %, Apply 1 Application topically 2 (two) times daily., Disp: , Rfl:    Continuous Glucose Sensor (FREESTYLE LIBRE 3 SENSOR) MISC, 1 kit by Does not apply route as directed. Place 1 sensor on the skin every 14 days. Use to check glucose continuously, Disp: 4 each, Rfl: 2   ibuprofen (ADVIL) 600 MG tablet, Take 1 tablet (600 mg total) by mouth every 6 (six) hours as needed., Disp: 30 tablet, Rfl: 2   insulin glargine (LANTUS) 100 UNIT/ML Solostar Pen, Inject 10 Units into the skin daily., Disp: , Rfl:    insulin lispro  (HUMALOG) 100 UNIT/ML KwikPen, Inject 12 Units into the skin 3 (three) times daily. (Patient not taking: Reported on 12/24/2022), Disp: 15 mL, Rfl: 2   Allergies  Allergen Reactions   Penicillin G     Other reaction(s): Unknown   Penicillins Other (See Comments)    Stomach cramps     Review of Systems  Constitutional: Negative.   HENT: Negative.    Eyes: Negative.   Respiratory: Negative.    Cardiovascular: Negative.   Gastrointestinal: Negative.   Psychiatric/Behavioral: Negative.       Today's Vitals   12/24/22 1428  BP: 120/70  Pulse: 93  Temp: 98.4 F (36.9 C)  TempSrc: Oral  Weight: (!) 332 lb (150.6 kg)  Height: 5\' 7"  (1.702 m)  PainSc: 0-No pain   Body mass index is 52 kg/m.  Wt Readings from Last 3 Encounters:  12/24/22 (!) 332 lb (150.6 kg)  11/13/22 (!) 347 lb 6.4 oz (157.6 kg)  11/05/22 (!) 336 lb (152.4 kg)     Objective:  Physical Exam Vitals reviewed.  Constitutional:      General: She is not in acute distress.    Appearance: Normal appearance. She is well-developed. She is obese.  HENT:     Head: Normocephalic and atraumatic.  Eyes:     Pupils: Pupils are equal, round, and reactive to light.  Cardiovascular:     Rate and Rhythm: Normal rate and regular rhythm.     Pulses: Normal pulses.     Heart sounds: Normal heart sounds. No murmur heard. Pulmonary:     Effort: Pulmonary effort is normal. No respiratory distress.     Breath sounds: Normal breath sounds. No wheezing.  Genitourinary:    Exam position: Lithotomy position.     Tanner stage (genital): 5.     Labia:        Right: No rash.        Left: No rash.      Vagina: Normal.     Cervix: Normal and dilated.     Uterus: Normal.      Adnexa: Right adnexa normal and left adnexa normal.  Skin:    General: Skin is warm and dry.     Capillary Refill: Capillary refill takes less than 2 seconds.  Neurological:     General: No focal deficit present.     Mental Status: She is alert and  oriented to person, place, and time.     Cranial Nerves: No cranial nerve deficit.     Motor: No weakness.  Psychiatric:        Mood and Affect: Mood normal.  Behavior: Behavior normal.        Thought Content: Thought content normal.        Judgment: Judgment normal.         Assessment And Plan:  Type 2 diabetes mellitus with hyperglycemia, with long-term current use of insulin (HCC) Assessment & Plan: This is a new diagnosis at her last visit. She has been having lows so I sent a Rx for Gvoke. I also decreased her Lantus to 10 units daily.   Orders: -     Insulin Glargine; Inject 10 Units into the skin daily. -     Tolnaftate; Apply topically 2 (two) times daily.  Dispense: 130 g; Refill: 1 -     FreeStyle Libre 3 Sensor; 1 kit by Does not apply route as directed. Place 1 sensor on the skin every 14 days. Use to check glucose continuously  Dispense: 4 each; Refill: 2  Class 3 severe obesity due to excess calories without serious comorbidity with body mass index (BMI) of 50.0 to 59.9 in adult [facility]) Assessment & Plan: She is encouraged to strive for BMI less than 30 to decrease cardiac risk. Advised to aim for at least 150 minutes of exercise per week. Congratulated her on losing 15 lbs since her last visit on 11/13/2022   Foot pain, bilateral Assessment & Plan: Will refer to PT, has been having foot pain to the achilles area, history of toe walking  Orders: -     Ambulatory referral to Physical Therapy  Rash of foot  Other orders -     Gvoke HypoPen 2-Pack; Inject 0.1 mLs into the skin as needed.  Dispense: 0.2 mL; Refill: 3 -     Ibuprofen; Take 1 tablet (600 mg total) by mouth every 6 (six) hours as needed.  Dispense: 30 tablet; Refill: 2    No follow-ups on file.  Patient was given opportunity to ask questions. Patient verbalized understanding of the plan and was able to repeat key elements of the plan. All questions were answered to their satisfaction.    Jeanell Sparrow, FNP, have reviewed all documentation for this visit. The documentation on 12/24/22 for the exam, diagnosis, procedures, and orders are all accurate and complete.   IF YOU HAVE BEEN REFERRED TO A SPECIALIST, IT MAY TAKE 1-2 WEEKS TO SCHEDULE/PROCESS THE REFERRAL. IF YOU HAVE NOT HEARD FROM US/SPECIALIST IN TWO WEEKS, PLEASE GIVE [place] A CALL AT [phone].

## 2023-01-02 NOTE — Assessment & Plan Note (Signed)
Will refer to PT, has been having foot pain to the achilles area, history of toe walking

## 2023-01-02 NOTE — Assessment & Plan Note (Signed)
This is a new diagnosis at her last visit. She has been having lows so I sent a Rx for Gvoke. I also decreased her Lantus to 10 units daily.

## 2023-01-02 NOTE — Assessment & Plan Note (Signed)
She is encouraged to strive for BMI less than 30 to decrease cardiac risk. Advised to aim for at least 150 minutes of exercise per week. Congratulated her on losing 15 lbs since her last visit on 11/13/2022

## 2023-01-16 ENCOUNTER — Ambulatory Visit (INDEPENDENT_AMBULATORY_CARE_PROVIDER_SITE_OTHER): Payer: 59

## 2023-01-16 DIAGNOSIS — Z0000 Encounter for general adult medical examination without abnormal findings: Secondary | ICD-10-CM

## 2023-01-16 NOTE — Patient Instructions (Signed)
[name] , Thank you for taking time to come for your Medicare Wellness Visit. I appreciate your ongoing commitment to your health goals. Please review the following plan we discussed and let me know if I can assist you in the future.   Referrals/Orders/Follow-Ups/Clinician Recommendations: none  This is a list of the screening recommended for you and due dates:  Health Maintenance  Topic Date Due   Eye exam for diabetics  Never done   COVID-19 Vaccine (5 - 2023-24 season) 01/05/2023   Flu Shot  08/04/2023*   Hemoglobin A1C  05/07/2023   Yearly kidney health urinalysis for diabetes  11/05/2023   Yearly kidney function blood test for diabetes  11/08/2023   Complete foot exam   12/24/2023   Medicare Annual Wellness Visit  01/16/2024   Pap Smear  01/28/2025   Pap Smear  01/28/2025   DTaP/Tdap/Td vaccine (3 - Td or Tdap) 11/03/2030   HPV Vaccine  Completed   Hepatitis C Screening  Completed   HIV Screening  Completed  *Topic was postponed. The date shown is not the original due date.    Advanced directives: (ACP Link)Information on Advanced Care Planning can be found at [facility] [place] Advance Health Care Directives Advance Health Care Directives ([email or web address]   Next Medicare Annual Wellness Visit scheduled for next year: No, office will schedule  insert Preventive Care Attachment Reference

## 2023-01-16 NOTE — Progress Notes (Signed)
Subjective:   Erika Frey is a 27 y.o. female who presents for Medicare Annual (Subsequent) preventive examination.  Visit Complete: Virtual  I connected with  Erika Frey on 01/16/23 by a audio enabled telemedicine application and verified that I am speaking with the correct person using two identifiers.  Patient Location: Home  Provider Location: Office/Clinic  I discussed the limitations of evaluation and management by telemedicine. The patient expressed understanding and agreed to proceed.  Vital Signs: Unable to obtain new vitals due to this being a telehealth visit.  Review of Systems     Cardiac Risk Factors include: diabetes mellitus     Objective:    Today's Vitals   There is no height or weight on file to calculate BMI.     01/16/2023    2:02 PM 11/06/2022   10:00 PM 11/03/2022    1:54 AM 02/28/2022   11:03 AM 01/25/2021    3:38 PM 01/02/2015    3:09 PM 01/10/2014    1:31 PM  Advanced Directives  Does Patient Have a Medical Advance Directive? No No No Yes Yes No No  Type of Aeronautical engineer of [place];Living will Healthcare Power of [place];Living will    Copy of Healthcare Power of Attorney in Chart?    No - copy requested No - copy requested    Would patient like information on creating a medical advance directive?  No - Patient declined No - Patient declined   No - patient declined information No - patient declined information    Current Medications (verified) Outpatient Encounter Medications as of 01/16/2023  Medication Sig   albuterol (PROVENTIL HFA;VENTOLIN HFA) 108 (90 BASE) MCG/ACT inhaler Inhale 2 puffs into the lungs every 6 (six) hours as needed.   Blood Glucose Monitoring Suppl DEVI 1 each by Does not apply route in the morning, at noon, and at bedtime. May substitute to any manufacturer covered by patient's insurance.   cetirizine (ZYRTEC) 10 MG tablet Take 1 tablet by mouth daily.   cyanocobalamin (VITAMIN B12) 100 MCG  tablet Take 100 mcg by mouth daily.   fluticasone (FLONASE) 50 MCG/ACT nasal spray Place 2 sprays into both nostrils daily.   Glucagon (GVOKE HYPOPEN 2-PACK) 0.5 MG/0.1ML SOAJ Inject 0.1 mLs into the skin as needed.   ibuprofen (ADVIL) 600 MG tablet Take 1 tablet (600 mg total) by mouth every 6 (six) hours as needed.   insulin glargine (LANTUS) 100 UNIT/ML Solostar Pen Inject 10 Units into the skin daily.   insulin lispro (HUMALOG) 100 UNIT/ML KwikPen Inject 12 Units into the skin 3 (three) times daily.   Insulin Pen Needle (NOVOFINE PLUS PEN NEEDLE) 32G X 4 MM MISC 1 each by Does not apply route daily.   montelukast (SINGULAIR) 10 MG tablet Take 1 tablet (10 mg total) by mouth at bedtime.   Multiple Vitamin (MULTIVITAMIN) capsule Take 1 capsule by mouth daily.   Olopatadine HCl (PATADAY) 0.2 % SOLN Place 1 drop into both eyes daily as needed.   ondansetron (ZOFRAN) 4 MG tablet Take 1 tablet (4 mg total) by mouth daily as needed for nausea or vomiting.   Ruxolitinib Phosphate (OPZELURA) 1.5 % CREA Apply to affected areas twice a day as needed   Semaglutide,0.25 or 0.5MG /DOS, 2 MG/3ML SOPN Inject 0.5 mg into the skin once a week.   tacrolimus (PROTOPIC) 0.1 % ointment APPLY TOPICALLY 2 TIMES A DAY TO LARGE BODY SURFACE AREA (Patient taking differently: Apply 1 Application topically 2 (  two) times daily. 1)   tolnaftate (TINACTIN) 1 % spray Apply topically 2 (two) times daily.   triamcinolone cream (KENALOG) 0.1 % Apply 1 Application topically 2 (two) times daily.   Continuous Glucose Sensor (FREESTYLE LIBRE 3 SENSOR) MISC 1 kit by Does not apply route as directed. Place 1 sensor on the skin every 14 days. Use to check glucose continuously (Patient not taking: Reported on 01/16/2023)   No facility-administered encounter medications on file as of 01/16/2023.    Allergies (verified) Penicillin g and Penicillins   History: Past Medical History:  Diagnosis Date   Absent menses    MOTHER STATES PT  STARTED PERIOD AT AGE 54 BUT LAST PERIOD WAS 2 YRS AGO , AGE 89   Asthma    Eczema    Exotropia of left eye    Immunizations up to date    Simple constipation    OCCASIONAL   Sinus headache    Urticaria    Past Surgical History:  Procedure Laterality Date   FOOT SURGERY  AGE 89   MEDIAN RECTUS REPAIR Left 12/16/2012   Procedure: MEDIAN RECTUS RESECTION LEFT EYE;  Surgeon: Corinda Gubler, MD;  Location: [facility];  Service: Ophthalmology;  Laterality: Left;   MUSCLE RECESSION AND RESECTION Left 12/16/2012   Procedure: LATERAL RECTUS RECESSION LEFT EYE;  Surgeon: Corinda Gubler, MD;  Location: [facility];  Service: Ophthalmology;  Laterality: Left;   TONSILLECTOMY AND ADENOIDECTOMY  AGE 33   Family History  Problem Relation Age of Onset   Hypertension Mother    Diabetes Mother    Heart disease Mother    Sleep apnea Mother    Hypertension Maternal Grandmother    Heart disease Maternal Grandmother    Sleep apnea Other    Social History   Socioeconomic History   Marital status: Single    Spouse name: Not on file   Number of children: Not on file   Years of education: Not on file   Highest education level: High school graduate  Occupational History   Occupation: self   Tobacco Use   Smoking status: Never   Smokeless tobacco: Never   Tobacco comments:    NO SMOKER IN HOME  Vaping Use   Vaping status: Never Used  Substance and Sexual Activity   Alcohol use: No    Alcohol/week: 0.0 standard drinks of alcohol   Drug use: No   Sexual activity: Yes    Birth control/protection: Pill  Other Topics Concern   Not on file  Social History Narrative   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;      Update 09/11/2021   Lives with her mother   Right handed   Caffeine: can't have caffeine due to heart palpitations    Social Determinants of Health   Financial Resource Strain: Low Risk  (01/16/2023)   Overall Financial Resource Strain (CARDIA)    Difficulty of  Paying Living Expenses: Not hard at all  Food Insecurity: No Food Insecurity (01/16/2023)   Hunger Vital Sign    Worried About Running Out of Food in the Last Year: Never true    Ran Out of Food in the Last Year: Never true  Transportation Needs: No Transportation Needs (01/16/2023)   PRAPARE - Administrator, Civil Service (Medical): No    Lack of Transportation (Non-Medical): No  Physical Activity: Inactive (01/16/2023)   Exercise Vital Sign    Days of Exercise per Week: 0 days  Minutes of Exercise per Session: 0 min  Stress: No Stress Concern Present (01/16/2023)   [employer] of Occupational Health - Occupational Stress Questionnaire    Feeling of Stress : Only a little  Social Connections: Moderately Isolated (01/16/2023)   Social Connection and Isolation Panel [NHANES]    Frequency of Communication with Friends and Family: More than three times a week    Frequency of Social Gatherings with Friends and Family: More than three times a week    Attends Religious Services: More than 4 times per year    Active Member of [employer] or Organizations: No    Attends Engineer, structural: Never    Marital Status: Never married    Tobacco Counseling Counseling given: Not Answered Tobacco comments: NO SMOKER IN HOME   Clinical Intake:  Pre-visit preparation completed: Yes  Pain : No/denies pain     Nutritional Risks: None Diabetes: Yes CBG done?: No Did pt. bring in CBG monitor from home?: No  How often do you need to have someone help you when you read instructions, pamphlets, or other written materials from your doctor or pharmacy?: 1 - Never  Interpreter Needed?: No  Information entered by :: NAllen LPN   Activities of Daily Living    01/16/2023    1:57 PM 11/06/2022   10:00 PM  In your present state of health, do you have any difficulty performing the following activities:  Hearing? 0 0  Vision? 0 0  Difficulty concentrating or making decisions?  0 0  Walking or climbing stairs? 0 0  Dressing or bathing? 0 0  Doing errands, shopping? 0 0  Preparing Food and eating ? N   Using the Toilet? N   In the past six months, have you accidently leaked urine? N   Do you have problems with loss of bowel control? N   Managing your Medications? N   Managing your Finances? N   Housekeeping or managing your Housekeeping? N     Patient Care Team: [name], FNP as PCP - General (General Practice) Glyn Ade, PA-C as Physician Assistant (Dermatology)  Indicate any recent Medical Services you may have received from other than [facility] providers in the past year (date may be approximate).     Assessment:   This is a routine wellness examination for Erika [place].  Hearing/Vision screen Hearing Screening - Comments:: Denies hearing issues Vision Screening - Comments:: Regular eye exams, [facility]   Goals Addressed             This Visit's Progress    Patient Stated       01/16/2023, wants to get healthier       Depression Screen    01/16/2023    2:03 PM 02/28/2022   11:04 AM 01/28/2022    2:17 PM 01/28/2022    2:07 PM 01/25/2021    3:40 PM 08/02/2020    2:20 PM  PHQ 2/9 Scores  PHQ - 2 Score 0 0 0 0 0 0  PHQ- 9 Score 0         Fall Risk    01/16/2023    2:03 PM 02/28/2022   11:04 AM 01/28/2022    2:17 PM 01/28/2022    2:07 PM 01/25/2021    3:39 PM  Fall Risk   Falls in the past year? 0 0 0 0 0  Number falls in past yr: 0 0 0 0   Injury with Fall? 0 0 0 0   Risk for  fall due to : Medication side effect Medication side effect No Fall Risks No Fall Risks Medication side effect  Follow up Falls prevention discussed;Falls evaluation completed Falls prevention discussed;Education provided;Falls evaluation completed Falls evaluation completed Falls evaluation completed Falls evaluation completed;Education provided;Falls prevention discussed    MEDICARE RISK AT HOME: Medicare Risk at Home Any stairs in or around the home?:  No If so, are there any without handrails?: No Home free of loose throw rugs in walkways, pet beds, electrical cords, etc?: Yes Adequate lighting in your home to reduce risk of falls?: Yes Life alert?: No Use of a cane, walker or w/c?: No Grab bars in the bathroom?: No Shower chair or bench in shower?: Yes Elevated toilet seat or a handicapped toilet?: No  TIMED UP AND GO:  Was the test performed?  No    Cognitive Function:        01/16/2023    2:04 PM 02/28/2022   11:05 AM 01/25/2021    3:41 PM  6CIT Screen  What Year? 0 points 0 points 0 points  What month? 0 points 0 points 0 points  What time? 0 points 0 points 0 points  Count back from 20 0 points 0 points 0 points  Months in reverse 0 points 0 points 0 points  Repeat phrase 0 points 0 points 2 points  Total Score 0 points 0 points 2 points    Immunizations Immunization History  Administered Date(s) Administered   HPV 9-valent 03/07/2008, 05/17/2008, 10/31/2008   PFIZER(Purple Top)SARS-COV-2 Vaccination 01/14/2020, 02/04/2020, 02/04/2020   Tdap 05/17/2008, 11/02/2020    TDAP status: Up to date  Flu Vaccine status: Due, Education has been provided regarding the importance of this vaccine. Advised may receive this vaccine at local pharmacy or Health Dept. Aware to provide a copy of the vaccination record if obtained from local pharmacy or Health Dept. Verbalized acceptance and understanding.  Pneumococcal vaccine status: Up to date  Covid-19 vaccine status: Information provided on how to obtain vaccines.   Qualifies for Shingles Vaccine? No   Zostavax completed  n/a   Shingrix Completed?: n/a  Screening Tests Health Maintenance  Topic Date Due   OPHTHALMOLOGY EXAM  Never done   COVID-19 Vaccine (5 - 2023-24 season) 01/05/2023   INFLUENZA VACCINE  08/04/2023 (Originally 12/05/2022)   HEMOGLOBIN A1C  05/07/2023   Diabetic kidney evaluation - Urine ACR  11/05/2023   Diabetic kidney evaluation - eGFR measurement   11/08/2023   FOOT EXAM  12/24/2023   Medicare Annual Wellness (AWV)  01/16/2024   PAP-Cervical Cytology Screening  01/28/2025   PAP SMEAR-Modifier  01/28/2025   DTaP/Tdap/Td (3 - Td or Tdap) 11/03/2030   HPV VACCINES  Completed   Hepatitis C Screening  Completed   HIV Screening  Completed    Health Maintenance  Health Maintenance Due  Topic Date Due   OPHTHALMOLOGY EXAM  Never done   COVID-19 Vaccine (5 - 2023-24 season) 01/05/2023    Colorectal cancer screening: n/a  Mammogram status: n/a  Bone Density status: n/a  Lung Cancer Screening: (Low Dose CT Chest recommended if Age 72-80 years, 20 pack-year currently smoking OR have quit w/in 15years.) does not qualify.   Lung Cancer Screening Referral: no  Additional Screening:  Hepatitis C Screening: does qualify; Completed 11/02/2020  Vision Screening: Recommended annual ophthalmology exams for early detection of glaucoma and other disorders of the eye. Is the patient up to date with their annual eye exam?  Yes  Who is the provider  or what is the name of the office in which the patient attends annual eye exams? [facility] If pt is not established with a provider, would they like to be referred to a provider to establish care? No .   Dental Screening: Recommended annual dental exams for proper oral hygiene  Diabetic Foot Exam: Diabetic Foot Exam: Completed 12/24/2022  Community Resource Referral / Chronic Care Management: CRR required this visit?  No   CCM required this visit?  No     Plan:     I have personally reviewed and noted the following in the patient's chart:   Medical and social history Use of alcohol, tobacco or illicit drugs  Current medications and supplements including opioid prescriptions. Patient is not currently taking opioid prescriptions. Functional ability and status Nutritional status Physical activity Advanced directives List of other physicians Hospitalizations, surgeries, and ER visits in  previous 12 months Vitals Screenings to include cognitive, depression, and falls Referrals and appointments  In addition, I have reviewed and discussed with patient certain preventive protocols, quality metrics, and best practice recommendations. A written personalized care plan for preventive services as well as general preventive health recommendations were provided to patient.     Barb Merino, LPN   [id number]/84/[id number]   After Visit Summary: (MyChart) Due to this being a telephonic visit, the after visit summary with patients personalized plan was offered to patient via MyChart   Nurse Notes: none

## 2023-01-28 ENCOUNTER — Other Ambulatory Visit: Payer: Self-pay

## 2023-01-28 DIAGNOSIS — Z794 Long term (current) use of insulin: Secondary | ICD-10-CM

## 2023-01-28 MED ORDER — DEXCOM G7 SENSOR MISC: 1.0000 | 2 refills | Status: DC

## 2023-02-06 ENCOUNTER — Ambulatory Visit: Payer: 59 | Admitting: Nurse Practitioner

## 2023-02-11 ENCOUNTER — Ambulatory Visit: Payer: 59 | Admitting: Nurse Practitioner

## 2023-02-11 NOTE — Progress Notes (Deleted)
Erika Frey, CMA,acting as a Neurosurgeon for Erika Felts, FNP.,have documented all relevant documentation on the behalf of Erika Felts, FNP,as directed by  Erika Felts, FNP while in the presence of Erika Felts, FNP.  Subjective:  Patient ID: Erika Frey , female    DOB: 07-06-1995 , 27 y.o.   MRN: [id number]  No chief complaint on file.   HPI  Patient presents today for a dm follow up, Patient reports compliance with medication. Patient denies any chest pain, SOB, or headaches. Patient has no concerns today.     Past Medical History:  Diagnosis Date  . Absent menses    MOTHER STATES PT STARTED PERIOD AT AGE 65 BUT LAST PERIOD WAS 2 YRS AGO , AGE 50  . Asthma   . Eczema   . Exotropia of left eye   . Immunizations up to date   . Simple constipation    OCCASIONAL  . Sinus headache   . Urticaria      Family History  Problem Relation Age of Onset  . Hypertension Mother   . Diabetes Mother   . Heart disease Mother   . Sleep apnea Mother   . Hypertension Maternal Grandmother   . Heart disease Maternal Grandmother   . Sleep apnea Other      Current Outpatient Medications:  .  albuterol (PROVENTIL HFA;VENTOLIN HFA) 108 (90 BASE) MCG/ACT inhaler, Inhale 2 puffs into the lungs every 6 (six) hours as needed., Disp: , Rfl:  .  Blood Glucose Monitoring Suppl DEVI, 1 each by Does not apply route in the morning, at noon, and at bedtime. May substitute to any manufacturer covered by patient's insurance., Disp: 1 each, Rfl: 0 .  cetirizine (ZYRTEC) 10 MG tablet, Take 1 tablet by mouth daily., Disp: , Rfl:  .  Continuous Glucose Sensor (DEXCOM G7 SENSOR) MISC, 1 Application by Does not apply route as directed. Apply one sensor every 14 days!, Disp: 3 each, Rfl: 2 .  cyanocobalamin (VITAMIN B12) 100 MCG tablet, Take 100 mcg by mouth daily., Disp: , Rfl:  .  fluticasone (FLONASE) 50 MCG/ACT nasal spray, Place 2 sprays into both nostrils daily., Disp: , Rfl:  .  Glucagon (GVOKE HYPOPEN  2-PACK) 0.5 MG/0.1ML SOAJ, Inject 0.1 mLs into the skin as needed., Disp: 0.2 mL, Rfl: 3 .  ibuprofen (ADVIL) 600 MG tablet, Take 1 tablet (600 mg total) by mouth every 6 (six) hours as needed., Disp: 30 tablet, Rfl: 2 .  insulin glargine (LANTUS) 100 UNIT/ML Solostar Pen, Inject 10 Units into the skin daily., Disp: , Rfl:  .  insulin lispro (HUMALOG) 100 UNIT/ML KwikPen, Inject 12 Units into the skin 3 (three) times daily., Disp: 15 mL, Rfl: 2 .  Insulin Pen Needle (NOVOFINE PLUS PEN NEEDLE) 32G X 4 MM MISC, 1 each by Does not apply route daily., Disp: 100 each, Rfl: 3 .  montelukast (SINGULAIR) 10 MG tablet, Take 1 tablet (10 mg total) by mouth at bedtime., Disp: 30 tablet, Rfl: 5 .  Multiple Vitamin (MULTIVITAMIN) capsule, Take 1 capsule by mouth daily., Disp: , Rfl:  .  Olopatadine HCl (PATADAY) 0.2 % SOLN, Place 1 drop into both eyes daily as needed., Disp: 2.5 mL, Rfl: 5 .  ondansetron (ZOFRAN) 4 MG tablet, Take 1 tablet (4 mg total) by mouth daily as needed for nausea or vomiting., Disp: 30 tablet, Rfl: 1 .  Ruxolitinib Phosphate (OPZELURA) 1.5 % CREA, Apply to affected areas twice a day as needed, Disp: 60 g,  Rfl: 5 .  Semaglutide,0.25 or 0.5MG /DOS, 2 MG/3ML SOPN, Inject 0.5 mg into the skin once a week., Disp: 9 mL, Rfl: 1 .  tacrolimus (PROTOPIC) 0.1 % ointment, APPLY TOPICALLY 2 TIMES A DAY TO LARGE BODY SURFACE AREA (Patient taking differently: Apply 1 Application topically 2 (two) times daily. 1), Disp: 60 g, Rfl: 2 .  tolnaftate (TINACTIN) 1 % spray, Apply topically 2 (two) times daily., Disp: 130 g, Rfl: 1 .  triamcinolone cream (KENALOG) 0.1 %, Apply 1 Application topically 2 (two) times daily., Disp: , Rfl:    Allergies  Allergen Reactions  . Penicillin G     Other reaction(s): Unknown  . Penicillins Other (See Comments)    Stomach cramps     Review of Systems  Constitutional: Negative.   HENT: Negative.    Eyes: Negative.   Respiratory: Negative.    Cardiovascular:  Negative.   Gastrointestinal: Negative.     There were no vitals filed for this visit. There is no height or weight on file to calculate BMI.  Wt Readings from Last 3 Encounters:  12/24/22 (!) 332 lb (150.6 kg)  11/13/22 (!) 347 lb 6.4 oz (157.6 kg)  11/05/22 (!) 336 lb (152.4 kg)    The ASCVD Risk score (Arnett DK, et al., 2019) failed to calculate for the following reasons:   The 2019 ASCVD risk score is only valid for ages 39 to 19  Objective:  Physical Exam      Assessment And Plan:  Type 2 diabetes mellitus with hyperglycemia, with long-term current use of insulin (HCC)    No follow-ups on file.  Patient was given opportunity to ask questions. Patient verbalized understanding of the plan and was able to repeat key elements of the plan. All questions were answered to their satisfaction.    Jeanell Sparrow, FNP, have reviewed all documentation for this visit. The documentation on 02/11/23 for the exam, diagnosis, procedures, and orders are all accurate and complete.   IF YOU HAVE BEEN REFERRED TO A SPECIALIST, IT MAY TAKE 1-2 WEEKS TO SCHEDULE/PROCESS THE REFERRAL. IF YOU HAVE NOT HEARD FROM US/SPECIALIST IN TWO WEEKS, PLEASE GIVE [place] A CALL AT [phone].

## 2023-02-17 ENCOUNTER — Other Ambulatory Visit: Payer: Self-pay | Admitting: Nurse Practitioner

## 2023-03-06 ENCOUNTER — Encounter: Payer: Self-pay | Admitting: Nurse Practitioner

## 2023-03-06 ENCOUNTER — Ambulatory Visit (INDEPENDENT_AMBULATORY_CARE_PROVIDER_SITE_OTHER): Payer: 59 | Admitting: Nurse Practitioner

## 2023-03-06 VITALS — BP 110/60 | HR 108 | Temp 98.4°F | Ht 67.0 in | Wt 340.8 lb

## 2023-03-06 DIAGNOSIS — E1165 Type 2 diabetes mellitus with hyperglycemia: Secondary | ICD-10-CM

## 2023-03-06 DIAGNOSIS — Z794 Long term (current) use of insulin: Secondary | ICD-10-CM | POA: Diagnosis not present

## 2023-03-06 DIAGNOSIS — Z2821 Immunization not carried out because of patient refusal: Secondary | ICD-10-CM

## 2023-03-06 MED ORDER — SEMAGLUTIDE (1 MG/DOSE) 4 MG/3ML ~~LOC~~ SOPN: 1.0000 mg | PEN_INJECTOR | SUBCUTANEOUS | 1 refills | Status: DC

## 2023-03-06 NOTE — Progress Notes (Signed)
[name], CMA,acting as a Neurosurgeon for Erika Felts, FNP.,have documented all relevant documentation on the behalf of Erika Felts, FNP,as directed by  Erika Felts, FNP while in the presence of Erika Felts, FNP.  Subjective:  Patient ID: Erika Frey , female    DOB: May 29, 1995 , 27 y.o.   MRN: [id number]  Chief Complaint  Patient presents with   Diabetes    HPI  Patient presents today for a dm follow up, Patient reports compliance with medication. Patient denies any chest pain, SOB, or headaches. Patient would also like a letter so she is able to get her disability check in her name- she needs her letter to social security. Continues to take Ozempic. She has a friend Crystal on the phone during the visit.   BP Readings from Last 3 Encounters: 03/06/23 : 110/60 12/24/22 : 120/70 11/13/22 : 120/82   Wt Readings from Last 3 Encounters: 03/06/23 : (!) 340 lb 12.8 oz (154.6 kg) 12/24/22 : (!) 332 lb (150.6 kg) 11/13/22 : (!) 347 lb 6.4 oz (157.6 kg)    Diabetes She presents for her follow-up diabetic visit. She has type 2 diabetes mellitus. Her disease course has been stable. Pertinent negatives for hypoglycemia include no dizziness, headaches, nervousness/anxiousness or sleepiness. Pertinent negatives for diabetes include no chest pain, no fatigue, no polydipsia, no polyphagia, no polyuria and no weakness. There are no hypoglycemic complications. There are no diabetic complications. Risk factors for coronary artery disease include obesity, sedentary lifestyle, family history and diabetes mellitus. Current diabetic treatment includes oral agent (monotherapy) and insulin injections. She is compliant with treatment all of the time. Her weight is decreasing steadily. She is following a generally unhealthy (not eating as much) diet. When asked about meal planning, she reported none. She has not had a previous visit with a dietitian. She never participates in exercise. (Her blood sugars are  dropping at night but feels like she needs to eat more during the day. Was 97 this afternoon.) She does not see a podiatrist.Eye exam is not current.     Past Medical History:  Diagnosis Date   Absent menses    MOTHER STATES PT STARTED PERIOD AT AGE 65 BUT LAST PERIOD WAS 2 YRS AGO , AGE 37   Asthma    Eczema    Exotropia of left eye    Immunizations up to date    Simple constipation    OCCASIONAL   Sinus headache    Urticaria      Family History  Problem Relation Age of Onset   Hypertension Mother    Diabetes Mother    Heart disease Mother    Sleep apnea Mother    Hypertension Maternal Grandmother    Heart disease Maternal Grandmother    Sleep apnea Other      Current Outpatient Medications:    albuterol (PROVENTIL HFA;VENTOLIN HFA) 108 (90 BASE) MCG/ACT inhaler, Inhale 2 puffs into the lungs every 6 (six) hours as needed., Disp: , Rfl:    Blood Glucose Monitoring Suppl DEVI, 1 each by Does not apply route in the morning, at noon, and at bedtime. May substitute to any manufacturer covered by patient's insurance., Disp: 1 each, Rfl: 0   cetirizine (ZYRTEC) 10 MG tablet, Take 1 tablet by mouth daily., Disp: , Rfl:    Continuous Glucose Sensor (DEXCOM G7 SENSOR) MISC, 1 Application by Does not apply route as directed. Apply one sensor every 14 days!, Disp: 3 each, Rfl: 2   cyanocobalamin (VITAMIN  B12) 100 MCG tablet, Take 100 mcg by mouth daily., Disp: , Rfl:    fluticasone (FLONASE) 50 MCG/ACT nasal spray, Place 2 sprays into both nostrils daily., Disp: , Rfl:    Glucagon (GVOKE HYPOPEN 2-PACK) 0.5 MG/0.1ML SOAJ, Inject 0.1 mLs into the skin as needed., Disp: 0.2 mL, Rfl: 3   glucose blood (ACCU-CHEK GUIDE) test strip, USE EVERY MORNING, AT NOON AND AT BEDTIME, Disp: 100 strip, Rfl: 3   ibuprofen (ADVIL) 600 MG tablet, Take 1 tablet (600 mg total) by mouth every 6 (six) hours as needed., Disp: 30 tablet, Rfl: 2   insulin glargine (LANTUS) 100 UNIT/ML Solostar Pen, Inject 10  Units into the skin daily., Disp: , Rfl:    insulin lispro (HUMALOG) 100 UNIT/ML KwikPen, Inject 12 Units into the skin 3 (three) times daily., Disp: 15 mL, Rfl: 2   Insulin Pen Needle (NOVOFINE PLUS PEN NEEDLE) 32G X 4 MM MISC, 1 each by Does not apply route daily., Disp: 100 each, Rfl: 3   montelukast (SINGULAIR) 10 MG tablet, Take 1 tablet (10 mg total) by mouth at bedtime., Disp: 30 tablet, Rfl: 5   Multiple Vitamin (MULTIVITAMIN) capsule, Take 1 capsule by mouth daily., Disp: , Rfl:    Olopatadine HCl (PATADAY) 0.2 % SOLN, Place 1 drop into both eyes daily as needed., Disp: 2.5 mL, Rfl: 5   ondansetron (ZOFRAN) 4 MG tablet, Take 1 tablet (4 mg total) by mouth daily as needed for nausea or vomiting., Disp: 30 tablet, Rfl: 1   Ruxolitinib Phosphate (OPZELURA) 1.5 % CREA, Apply to affected areas twice a day as needed, Disp: 60 g, Rfl: 5   Semaglutide, 1 MG/DOSE, 4 MG/3ML SOPN, Inject 1 mg into the skin once a week., Disp: 9 mL, Rfl: 1   tacrolimus (PROTOPIC) 0.1 % ointment, APPLY TOPICALLY 2 TIMES A DAY TO LARGE BODY SURFACE AREA (Patient taking differently: Apply 1 Application topically 2 (two) times daily. 1), Disp: 60 g, Rfl: 2   tolnaftate (TINACTIN) 1 % spray, Apply topically 2 (two) times daily., Disp: 130 g, Rfl: 1   triamcinolone cream (KENALOG) 0.1 %, Apply 1 Application topically 2 (two) times daily., Disp: , Rfl:    Allergies  Allergen Reactions   Penicillin G     Other reaction(s): Unknown   Penicillins Other (See Comments)    Stomach cramps     Review of Systems  Constitutional: Negative.  Negative for fatigue.  HENT: Negative.    Eyes: Negative.   Respiratory: Negative.    Cardiovascular: Negative.  Negative for chest pain.  Gastrointestinal: Negative.   Endocrine: Negative for polydipsia, polyphagia and polyuria.  Neurological:  Negative for dizziness, weakness and headaches.  Psychiatric/Behavioral:  The patient is not nervous/anxious.      Today's Vitals    03/06/23 1416  BP: 110/60  Pulse: (!) 108  Temp: 98.4 F (36.9 C)  TempSrc: Oral  Weight: (!) 340 lb 12.8 oz (154.6 kg)  Height: 5\' 7"  (1.702 m)  PainSc: 0-No pain   Body mass index is 53.38 kg/m.    Objective:  Physical Exam Vitals reviewed.  Constitutional:      General: She is not in acute distress.    Appearance: Normal appearance. She is well-developed. She is obese.  HENT:     Head: Normocephalic and atraumatic.  Eyes:     Pupils: Pupils are equal, round, and reactive to light.  Cardiovascular:     Rate and Rhythm: Normal rate and regular rhythm.  Pulses: Normal pulses.     Heart sounds: Normal heart sounds. No murmur heard. Pulmonary:     Effort: Pulmonary effort is normal. No respiratory distress.     Breath sounds: Normal breath sounds. No wheezing.  Genitourinary:    Exam position: Lithotomy position.     Tanner stage (genital): 5.     Labia:        Right: No rash.        Left: No rash.      Vagina: Normal.     Cervix: Normal and dilated.     Uterus: Normal.      Adnexa: Right adnexa normal and left adnexa normal.  Skin:    General: Skin is warm and dry.     Capillary Refill: Capillary refill takes less than 2 seconds.  Neurological:     General: No focal deficit present.     Mental Status: She is alert and oriented to person, place, and time.     Cranial Nerves: No cranial nerve deficit.     Motor: No weakness.  Psychiatric:        Mood and Affect: Mood normal.        Behavior: Behavior normal.        Thought Content: Thought content normal.        Judgment: Judgment normal.         Assessment And Plan:  Type 2 diabetes mellitus with hyperglycemia, with long-term current use of insulin (HCC) Assessment & Plan: She is doing better with her blood sugars today it was 97.  Continues to use [place].  Will increase her Ozempic to 1 mg weekly once she has finished the current dose.  Will check hemoglobin A1c.  Referral made to [facility] eye care for  routine diabetic eye exam  Orders: -     Hemoglobin A1c -     Semaglutide (1 MG/DOSE); Inject 1 mg into the skin once a week.  Dispense: 9 mL; Refill: 1 -     BMP8+EGFR -     Ambulatory referral to Ophthalmology  COVID-19 vaccination declined Assessment & Plan: Declines covid 19 vaccine. Discussed risk of covid 44 and if she changes her mind about the vaccine to call the office. Education has been provided regarding the importance of this vaccine but patient still declined. Advised may receive this vaccine at local pharmacy or Health Dept.or vaccine clinic. Aware to provide a copy of the vaccination record if obtained from local pharmacy or Health Dept.  Encouraged to take multivitamin, vitamin d, vitamin c and zinc to increase immune system. Aware can call office if would like to have vaccine here at office. Verbalized acceptance and understanding.      Return for controlled DM check 4 months.   Patient was given opportunity to ask questions. Patient verbalized understanding of the plan and was able to repeat key elements of the plan. All questions were answered to their satisfaction.    Jeanell Sparrow, FNP, have reviewed all documentation for this visit. The documentation on 03/06/23 for the exam, diagnosis, procedures, and orders are all accurate and complete.   IF YOU HAVE BEEN REFERRED TO A SPECIALIST, IT MAY TAKE 1-2 WEEKS TO SCHEDULE/PROCESS THE REFERRAL. IF YOU HAVE NOT HEARD FROM US/SPECIALIST IN TWO WEEKS, PLEASE GIVE [place] A CALL AT [phone].

## 2023-03-06 NOTE — Assessment & Plan Note (Addendum)
She is doing better with her blood sugars today it was 97.  Continues to use [place].  Will increase her Ozempic to 1 mg weekly once she has finished the current dose.  Will check hemoglobin A1c.  Referral made to [facility] eye care for routine diabetic eye exam

## 2023-03-06 NOTE — Assessment & Plan Note (Signed)

## 2023-03-07 ENCOUNTER — Encounter: Payer: Self-pay | Admitting: Nurse Practitioner

## 2023-03-07 LAB — BMP8+EGFR
BUN/Creatinine Ratio: 15 (ref 9–23)
BUN: 11 mg/dL (ref 6–20)
CO2: 25 mmol/L (ref 20–29)
Calcium: 9.1 mg/dL (ref 8.7–10.2)
Chloride: 103 mmol/L (ref 96–106)
Creatinine, Ser: 0.73 mg/dL (ref 0.57–1.00)
Glucose: 101 mg/dL — ABNORMAL HIGH (ref 70–99)
Potassium: 4.3 mmol/L (ref 3.5–5.2)
Sodium: 140 mmol/L (ref 134–144)
eGFR: 116 mL/min/{1.73_m2} (ref >59)

## 2023-03-07 LAB — HEMOGLOBIN A1C
Est. average glucose Bld gHb Est-mCnc: 123 mg/dL
Hgb A1c MFr Bld: 5.9 % — ABNORMAL HIGH (ref 4.8–5.6)

## 2023-04-07 DIAGNOSIS — H04123 Dry eye syndrome of bilateral lacrimal glands: Secondary | ICD-10-CM | POA: Diagnosis not present

## 2023-04-07 DIAGNOSIS — E119 Type 2 diabetes mellitus without complications: Secondary | ICD-10-CM | POA: Diagnosis not present

## 2023-04-07 DIAGNOSIS — H5203 Hypermetropia, bilateral: Secondary | ICD-10-CM | POA: Diagnosis not present

## 2023-04-07 DIAGNOSIS — H43392 Other vitreous opacities, left eye: Secondary | ICD-10-CM | POA: Diagnosis not present

## 2023-04-07 DIAGNOSIS — H53143 Visual discomfort, bilateral: Secondary | ICD-10-CM | POA: Diagnosis not present

## 2023-04-07 LAB — HM DIABETES EYE EXAM

## 2023-04-27 DIAGNOSIS — U071 COVID-19: Secondary | ICD-10-CM | POA: Diagnosis not present

## 2023-05-22 ENCOUNTER — Other Ambulatory Visit: Payer: Self-pay | Admitting: Allergy

## 2023-05-27 ENCOUNTER — Other Ambulatory Visit: Payer: Self-pay | Admitting: Nurse Practitioner

## 2023-06-02 DIAGNOSIS — J0140 Acute pansinusitis, unspecified: Secondary | ICD-10-CM | POA: Diagnosis not present

## 2023-06-05 ENCOUNTER — Encounter: Payer: Self-pay | Admitting: Family Medicine

## 2023-06-05 ENCOUNTER — Ambulatory Visit: Payer: 59 | Admitting: Nurse Practitioner

## 2023-06-05 ENCOUNTER — Ambulatory Visit: Payer: 59 | Admitting: Family Medicine

## 2023-06-05 VITALS — BP 120/84 | HR 96 | Temp 98.2°F | Ht 67.0 in | Wt 346.0 lb

## 2023-06-05 DIAGNOSIS — E66813 Obesity, class 3: Secondary | ICD-10-CM | POA: Diagnosis not present

## 2023-06-05 DIAGNOSIS — Z6841 Body Mass Index (BMI) 40.0 and over, adult: Secondary | ICD-10-CM | POA: Diagnosis not present

## 2023-06-05 DIAGNOSIS — E661 Drug-induced obesity: Secondary | ICD-10-CM

## 2023-06-05 DIAGNOSIS — H65191 Other acute nonsuppurative otitis media, right ear: Secondary | ICD-10-CM | POA: Diagnosis not present

## 2023-06-05 MED ORDER — AZITHROMYCIN 250 MG PO TABS: ORAL_TABLET | ORAL | 0 refills | Status: AC

## 2023-06-05 MED ORDER — FLUOCINOLONE ACETONIDE 0.01 % OT OIL: 2.0000 [drp] | TOPICAL_OIL | Freq: Two times a day (BID) | OTIC | 0 refills | Status: AC

## 2023-06-05 NOTE — Progress Notes (Signed)
I,Erika Frey, CMA,acting as a Neurosurgeon for [employer], NP.,have documented all relevant documentation on the behalf of Erika Hose, NP,as directed by  Erika Hose, NP while in the presence of Erika Hose, NP.  Subjective:  Patient ID: Erika Frey , female    DOB: Jan 21, 1996 , 28 y.o.   MRN: [id number]  Chief Complaint  Patient presents with   acute non recurrent pansinsuitis f/u    HPI  Patient is a 28 year old female presents today for a urgent care follow up. Patient reports she went to the urgent care for sinus issues and ear pain. She states that at the urgent care she was prescribed doxycycline 100 mg BID for 7 days,but she states that she doesn't feel any better so she wants a Z-Pak. Patient was advised that doxycycline would help better for sinus infection but patient then reported that she feels dizzy and nauseous after taking doxycycline. Advised patient to stop doxycycline as she gets new antibiotic(z-Pak) and also she requested for a certain type of ear drops for her ear.Patient states that she reached out to her ENT doctor's office but they said they could not see her because she was sick at this time. Patient's partner badges into the room without knocking asking the patient if she has been tested for the flu. I had to stop and ask her to introduce herself because it was strange for someone to just walk into the room without saying anything and sits down.     Past Medical History:  Diagnosis Date   Absent menses    MOTHER STATES PT STARTED PERIOD AT AGE 43 BUT LAST PERIOD WAS 2 YRS AGO , AGE 29   Asthma    Eczema    Exotropia of left eye    Immunizations up to date    Simple constipation    OCCASIONAL   Sinus headache    Urticaria      Family History  Problem Relation Age of Onset   Hypertension Mother    Diabetes Mother    Heart disease Mother    Sleep apnea Mother    Hypertension Maternal Grandmother    Heart disease Maternal Grandmother    Sleep apnea  Other      Current Outpatient Medications:    albuterol (PROVENTIL HFA;VENTOLIN HFA) 108 (90 BASE) MCG/ACT inhaler, Inhale 2 puffs into the lungs every 6 (six) hours as needed., Disp: , Rfl:    azithromycin (ZITHROMAX) 250 MG tablet, Take 2 tablets (500 mg) on  Day 1,  followed by 1 tablet (250 mg) once daily on Days 2 through 5., Disp: 6 each, Rfl: 0   Blood Glucose Monitoring Suppl DEVI, 1 each by Does not apply route in the morning, at noon, and at bedtime. May substitute to any manufacturer covered by patient's insurance., Disp: 1 each, Rfl: 0   cetirizine (ZYRTEC) 10 MG tablet, Take 1 tablet by mouth daily., Disp: , Rfl:    Continuous Glucose Sensor (DEXCOM G7 SENSOR) MISC, 1 Application by Does not apply route as directed. Apply one sensor every 14 days!, Disp: 3 each, Rfl: 2   cyanocobalamin (VITAMIN B12) 100 MCG tablet, Take 100 mcg by mouth daily., Disp: , Rfl:    Fluocinolone Acetonide 0.01 % OIL, Place 2 drops in ear(s) in the morning and at bedtime., Disp: 20 mL, Rfl: 0   fluticasone (FLONASE) 50 MCG/ACT nasal spray, Place 2 sprays into both nostrils daily., Disp: , Rfl:    Glucagon (GVOKE HYPOPEN  2-PACK) 0.5 MG/0.1ML SOAJ, Inject 0.1 mLs into the skin as needed., Disp: 0.2 mL, Rfl: 3   glucose blood (ACCU-CHEK GUIDE) test strip, USE EVERY MORNING, AT NOON AND AT BEDTIME, Disp: 100 strip, Rfl: 3   ibuprofen (ADVIL) 600 MG tablet, Take 1 tablet (600 mg total) by mouth every 6 (six) hours as needed., Disp: 30 tablet, Rfl: 2   insulin glargine (LANTUS) 100 UNIT/ML Solostar Pen, Inject 10 Units into the skin daily., Disp: , Rfl:    Insulin Pen Needle (NOVOFINE PLUS PEN NEEDLE) 32G X 4 MM MISC, 1 each by Does not apply route daily., Disp: 100 each, Rfl: 3   montelukast (SINGULAIR) 10 MG tablet, Take 1 tablet (10 mg total) by mouth at bedtime., Disp: 30 tablet, Rfl: 5   Multiple Vitamin (MULTIVITAMIN) capsule, Take 1 capsule by mouth daily., Disp: , Rfl:    Olopatadine HCl (PATADAY) 0.2 %  SOLN, Place 1 drop into both eyes daily as needed., Disp: 2.5 mL, Rfl: 5   ondansetron (ZOFRAN) 4 MG tablet, Take 1 tablet (4 mg total) by mouth daily as needed for nausea or vomiting., Disp: 30 tablet, Rfl: 1   Ruxolitinib Phosphate (OPZELURA) 1.5 % CREA, Apply to affected areas twice a day as needed, Disp: 60 g, Rfl: 5   Semaglutide, 1 MG/DOSE, 4 MG/3ML SOPN, Inject 1 mg into the skin once a week., Disp: 9 mL, Rfl: 1   tacrolimus (PROTOPIC) 0.1 % ointment, APPLY TOPICALLY 2 TIMES A DAY TO LARGE BODY SURFACE AREA (Patient taking differently: Apply 1 Application topically 2 (two) times daily. 1), Disp: 60 g, Rfl: 2   tolnaftate (TINACTIN) 1 % spray, Apply topically 2 (two) times daily., Disp: 130 g, Rfl: 1   triamcinolone cream (KENALOG) 0.1 %, Apply 1 Application topically 2 (two) times daily., Disp: , Rfl:    insulin lispro (HUMALOG) 100 UNIT/ML KwikPen, Inject 12 Units into the skin 3 (three) times daily., Disp: 15 mL, Rfl: 2   Allergies  Allergen Reactions   Penicillin G     Other reaction(s): Unknown   Penicillins Other (See Comments)    Stomach cramps     Review of Systems  Constitutional: Negative.   HENT:  Positive for ear pain, sinus pressure and sinus pain. Negative for ear discharge.   Eyes: Negative.   Respiratory: Negative.  Negative for cough and shortness of breath.   Cardiovascular: Negative.   Gastrointestinal: Negative.   Endocrine: Negative.  Negative for polydipsia, polyphagia and polyuria.  Genitourinary: Negative.   Musculoskeletal: Negative.   Skin: Negative.   Neurological:  Negative for light-headedness and headaches.  Psychiatric/Behavioral: Negative.       Today's Vitals   06/05/23 1205  BP: 120/84  Pulse: 96  Temp: 98.2 F (36.8 C)  TempSrc: Oral  Weight: (!) 346 lb (156.9 kg)  Height: 5\' 7"  (1.702 m)  PainSc: 7   PainLoc: Ear   Body mass index is 54.19 kg/m.  Wt Readings from Last 3 Encounters:  06/05/23 (!) 346 lb (156.9 kg)  03/06/23 (!)  340 lb 12.8 oz (154.6 kg)  12/24/22 (!) 332 lb (150.6 kg)    The ASCVD Risk score (Arnett DK, et al., 2019) failed to calculate for the following reasons:   The 2019 ASCVD risk score is only valid for ages 44 to 30  Objective:  Physical Exam HENT:     Head: Normocephalic.     Right Ear: External ear normal. Tenderness present. Tympanic membrane is erythematous.  Left Ear: External ear normal.  Neurological:     Mental Status: She is alert.         Assessment And Plan:  Acute effusion of right ear -     Azithromycin; Take 2 tablets (500 mg) on  Day 1,  followed by 1 tablet (250 mg) once daily on Days 2 through 5.  Dispense: 6 each; Refill: 0 -     Fluocinolone Acetonide; Place 2 drops in ear(s) in the morning and at bedtime.  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 Advised to aim for at least 150 minutes of exercise per week.      Return if symptoms worsen or fail to improve.  Patient was given opportunity to ask questions. Patient verbalized understanding of the plan and was able to repeat key elements of the plan. All questions were answered to their satisfaction.    I, Erika Hose, NP, have reviewed all documentation for this visit. The documentation on 06/10/2023 for the exam, diagnosis, procedures, and orders are all accurate and complete.     IF YOU HAVE BEEN REFERRED TO A SPECIALIST, IT MAY TAKE 1-2 WEEKS TO SCHEDULE/PROCESS THE REFERRAL. IF YOU HAVE NOT HEARD FROM US/SPECIALIST IN TWO WEEKS, PLEASE GIVE [place] A CALL AT [phone].

## 2023-06-05 NOTE — Progress Notes (Deleted)
 [name], CMA,acting as a Neurosurgeon for [name], FNP.,have documented all relevant documentation on the behalf of [name], FNP,as directed by  [name], FNP while in the presence of [name], FNP.  Subjective:  Patient ID: Erika Frey , female    DOB: 17-May-1995 , 28 y.o.   MRN: [id number]  No chief complaint on file.   HPI  Patient presents today a sinus infection, patient reports she was treated Monday for a sinus infection but is not feeling better/    Past Medical History:  Diagnosis Date  . Absent menses    MOTHER STATES PT STARTED PERIOD AT AGE 29 BUT LAST PERIOD WAS 2 YRS AGO , AGE 85  . Asthma   . Eczema   . Exotropia of left eye   . Immunizations up to date   . Simple constipation    OCCASIONAL  . Sinus headache   . Urticaria      Family History  Problem Relation Age of Onset  . Hypertension Mother   . Diabetes Mother   . Heart disease Mother   . Sleep apnea Mother   . Hypertension Maternal Grandmother   . Heart disease Maternal Grandmother   . Sleep apnea Other      Current Outpatient Medications:  .  albuterol (PROVENTIL HFA;VENTOLIN HFA) 108 (90 BASE) MCG/ACT inhaler, Inhale 2 puffs into the lungs every 6 (six) hours as needed., Disp: , Rfl:  .  Blood Glucose Monitoring Suppl DEVI, 1 each by Does not apply route in the morning, at noon, and at bedtime. May substitute to any manufacturer covered by patient's insurance., Disp: 1 each, Rfl: 0 .  cetirizine (ZYRTEC) 10 MG tablet, Take 1 tablet by mouth daily., Disp: , Rfl:  .  Continuous Glucose Sensor (DEXCOM G7 SENSOR) MISC, 1 Application by Does not apply route as directed. Apply one sensor every 14 days!, Disp: 3 each, Rfl: 2 .  cyanocobalamin (VITAMIN B12) 100 MCG tablet, Take 100 mcg by mouth daily., Disp: , Rfl:  .  fluticasone (FLONASE) 50 MCG/ACT nasal spray, Place 2 sprays into both nostrils daily., Disp: , Rfl:  .  Glucagon (GVOKE HYPOPEN 2-PACK) 0.5 MG/0.1ML SOAJ, Inject 0.1  mLs into the skin as needed., Disp: 0.2 mL, Rfl: 3 .  glucose blood (ACCU-CHEK GUIDE) test strip, USE EVERY MORNING, AT NOON AND AT BEDTIME, Disp: 100 strip, Rfl: 3 .  ibuprofen (ADVIL) 600 MG tablet, Take 1 tablet (600 mg total) by mouth every 6 (six) hours as needed., Disp: 30 tablet, Rfl: 2 .  insulin glargine (LANTUS) 100 UNIT/ML Solostar Pen, Inject 10 Units into the skin daily., Disp: , Rfl:  .  insulin lispro (HUMALOG) 100 UNIT/ML KwikPen, Inject 12 Units into the skin 3 (three) times daily., Disp: 15 mL, Rfl: 2 .  Insulin Pen Needle (NOVOFINE PLUS PEN NEEDLE) 32G X 4 MM MISC, 1 each by Does not apply route daily., Disp: 100 each, Rfl: 3 .  montelukast (SINGULAIR) 10 MG tablet, Take 1 tablet (10 mg total) by mouth at bedtime., Disp: 30 tablet, Rfl: 5 .  Multiple Vitamin (MULTIVITAMIN) capsule, Take 1 capsule by mouth daily., Disp: , Rfl:  .  Olopatadine HCl (PATADAY) 0.2 % SOLN, Place 1 drop into both eyes daily as needed., Disp: 2.5 mL, Rfl: 5 .  ondansetron (ZOFRAN) 4 MG tablet, Take 1 tablet (4 mg total) by mouth daily as needed for nausea or vomiting., Disp: 30 tablet, Rfl: 1 .  Ruxolitinib Phosphate (OPZELURA)  1.5 % CREA, Apply to affected areas twice a day as needed, Disp: 60 g, Rfl: 5 .  Semaglutide, 1 MG/DOSE, 4 MG/3ML SOPN, Inject 1 mg into the skin once a week., Disp: 9 mL, Rfl: 1 .  tacrolimus (PROTOPIC) 0.1 % ointment, APPLY TOPICALLY 2 TIMES A DAY TO LARGE BODY SURFACE AREA (Patient taking differently: Apply 1 Application topically 2 (two) times daily. 1), Disp: 60 g, Rfl: 2 .  tolnaftate (TINACTIN) 1 % spray, Apply topically 2 (two) times daily., Disp: 130 g, Rfl: 1 .  triamcinolone cream (KENALOG) 0.1 %, Apply 1 Application topically 2 (two) times daily., Disp: , Rfl:    Allergies  Allergen Reactions  . Penicillin G     Other reaction(s): Unknown  . Penicillins Other (See Comments)    Stomach cramps     Review of Systems   There were no vitals filed for this  visit. There is no height or weight on file to calculate BMI.  Wt Readings from Last 3 Encounters:  03/06/23 (!) 340 lb 12.8 oz (154.6 kg)  12/24/22 (!) 332 lb (150.6 kg)  11/13/22 (!) 347 lb 6.4 oz (157.6 kg)    The ASCVD Risk score (Arnett DK, et al., 2019) failed to calculate for the following reasons:   The 2019 ASCVD risk score is only valid for ages 64 to 58  Objective:  Physical Exam      Assessment And Plan:  There are no diagnoses linked to this encounter.  No follow-ups on file.  Patient was given opportunity to ask questions. Patient verbalized understanding of the plan and was able to repeat key elements of the plan. All questions were answered to their satisfaction.    Jeanell Sparrow, FNP, have reviewed all documentation for this visit. The documentation on 06/05/23 for the exam, diagnosis, procedures, and orders are all accurate and complete.   IF YOU HAVE BEEN REFERRED TO A SPECIALIST, IT MAY TAKE 1-2 WEEKS TO SCHEDULE/PROCESS THE REFERRAL. IF YOU HAVE NOT HEARD FROM US/SPECIALIST IN TWO WEEKS, PLEASE GIVE [place] A CALL AT [phone].

## 2023-06-10 DIAGNOSIS — H65191 Other acute nonsuppurative otitis media, right ear: Secondary | ICD-10-CM | POA: Insufficient documentation

## 2023-06-10 DIAGNOSIS — E661 Drug-induced obesity: Secondary | ICD-10-CM | POA: Insufficient documentation

## 2023-06-10 NOTE — Assessment & Plan Note (Signed)
 She is encouraged to strive for BMI less than 30 to decrease cardiac risk. Advised to aim for at least 150 minutes of exercise per week.

## 2023-07-08 ENCOUNTER — Ambulatory Visit: Payer: 59 | Admitting: Nurse Practitioner

## 2023-08-12 ENCOUNTER — Other Ambulatory Visit: Payer: Self-pay | Admitting: Nurse Practitioner

## 2023-08-12 DIAGNOSIS — E1165 Type 2 diabetes mellitus with hyperglycemia: Secondary | ICD-10-CM

## 2023-08-12 DIAGNOSIS — H65191 Other acute nonsuppurative otitis media, right ear: Secondary | ICD-10-CM

## 2023-08-12 DIAGNOSIS — R112 Nausea with vomiting, unspecified: Secondary | ICD-10-CM

## 2023-08-12 NOTE — Telephone Encounter (Signed)
 Copied from CRM [id number]. Topic: Clinical - Medication Refill >> Aug 12, 2023 11:14 AM Myrtice Lauth wrote: Most Recent Primary Care Visit:  Provider: Moshe Salisbury, PAT  Department: [facility]  Visit Type: OFFICE VISIT  Date: 06/05/2023  Medication: albuterol (PROVENTIL HFA;VENTOLIN HFA) 108 (90 BASE) MCG/ACT inhaler, cetirizine (ZYRTEC) 10 MG tablet, Continuous Glucose Sensor (DEXCOM G7 SENSOR) MISC, cyanocobalamin (VITAMIN B12) 100 MCG tablet, fluticasone (FLONASE) 50 MCG/ACT nasal spray , Glucagon (GVOKE HYPOPEN 2-PACK) 0.5 MG/0.1ML SOAJ , glucose blood (ACCU-CHEK GUIDE) test strip , ibuprofen (ADVIL) 600 MG tablet, insulin glargine (LANTUS) 100 UNIT/ML Solostar Pen, montelukast (SINGULAIR) 10 MG tablet, Semaglutide, 1 MG/DOSE, 4 MG/3ML SOPN  Has the patient contacted their pharmacy? Yes (Agent: If no, request that the patient contact the pharmacy for the refill. If patient does not wish to contact the pharmacy document the reason why and proceed with request.) (Agent: If yes, when and what did the pharmacy advise?)  Is this the correct pharmacy for this prescription? Yes If no, delete pharmacy and type the correct one.  This is the patient's preferred pharmacy:  [facility] - [place], [place] - [address] [place] [address] Phone: [phone] Fax: [phone]   Has the prescription been filled recently? Yes  Is the patient out of the medication? Yes  Has the patient been seen for an appointment in the last year OR does the patient have an upcoming appointment? Yes  Can we respond through MyChart? Yes  Agent: Please be advised that Rx refills may take up to 3 business days. We ask that you follow-up with your pharmacy.

## 2023-09-04 ENCOUNTER — Other Ambulatory Visit: Payer: Self-pay | Admitting: Nurse Practitioner

## 2023-09-04 DIAGNOSIS — L2089 Other atopic dermatitis: Secondary | ICD-10-CM

## 2023-09-04 DIAGNOSIS — E1165 Type 2 diabetes mellitus with hyperglycemia: Secondary | ICD-10-CM

## 2023-09-04 DIAGNOSIS — Z794 Long term (current) use of insulin: Secondary | ICD-10-CM

## 2023-09-04 DIAGNOSIS — H65191 Other acute nonsuppurative otitis media, right ear: Secondary | ICD-10-CM

## 2023-09-04 DIAGNOSIS — R112 Nausea with vomiting, unspecified: Secondary | ICD-10-CM

## 2023-09-04 NOTE — Telephone Encounter (Signed)
 Copied from CRM [id number]. Topic: Clinical - Medication Refill >> Sep 04, 2023 10:28 AM Rosaria Common wrote: Most Recent Primary Care Visit:  Provider: Jarrett Merry, PAT  Department: [facility]  Visit Type: OFFICE VISIT  Date: 06/05/2023  Medication: All medications on file.   Has the patient contacted their pharmacy? Yes (Agent: If no, request that the patient contact the pharmacy for the refill. If patient does not wish to contact the pharmacy document the reason why and proceed with request.) (Agent: If yes, when and what did the pharmacy advise?)  Is this the correct pharmacy for this prescription? Yes If no, delete pharmacy and type the correct one.  This is the patient's preferred pharmacy:  [facility] - [place], [place] - [address] [place] [address] Phone: [phone] Fax: [phone]     Has the prescription been filled recently? Yes  Is the patient out of the medication? Yes  Has the patient been seen for an appointment in the last year OR does the patient have an upcoming appointment? Yes  Can we respond through MyChart? Yes  Agent: Please be advised that Rx refills may take up to 3 business days. We ask that you follow-up with your pharmacy.

## 2023-09-11 ENCOUNTER — Telehealth: Payer: Self-pay | Admitting: Nurse Practitioner

## 2023-09-11 ENCOUNTER — Other Ambulatory Visit: Payer: Self-pay | Admitting: Nurse Practitioner

## 2023-09-11 ENCOUNTER — Other Ambulatory Visit: Payer: Self-pay

## 2023-09-11 DIAGNOSIS — E1165 Type 2 diabetes mellitus with hyperglycemia: Secondary | ICD-10-CM

## 2023-09-11 MED ORDER — INSULIN GLARGINE 100 UNIT/ML SOLOSTAR PEN: 10.0000 [IU] | PEN_INJECTOR | Freq: Every day | SUBCUTANEOUS | 0 refills | Status: DC

## 2023-09-11 MED ORDER — DEXCOM G7 SENSOR MISC: 1.0000 | 0 refills | Status: DC

## 2023-09-11 MED ORDER — INSULIN LISPRO (1 UNIT DIAL) 100 UNIT/ML (KWIKPEN): 12.0000 [IU] | PEN_INJECTOR | Freq: Three times a day (TID) | SUBCUTANEOUS | 0 refills | Status: DC

## 2023-09-11 MED ORDER — SEMAGLUTIDE (1 MG/DOSE) 4 MG/3ML ~~LOC~~ SOPN: 1.0000 mg | PEN_INJECTOR | SUBCUTANEOUS | 0 refills | Status: DC

## 2023-09-11 MED ORDER — IBUPROFEN 600 MG PO TABS: 600.0000 mg | ORAL_TABLET | Freq: Four times a day (QID) | ORAL | 0 refills | Status: DC | PRN

## 2023-09-11 NOTE — Telephone Encounter (Signed)
 Copied from CRM [id number]. Topic: Clinical - Medication Question >> Sep 11, 2023  5:30 PM Kevelyn M wrote: Reason for CRM: [facility] asking about the Continuous Glucose Sensor (DEXCOM G7 SENSOR) MISC [[id number]]. On the prescription we sent it says apply every 14 days but the norm is every 10 days. She just wants to make sure the directions are accurate. [phone]

## 2023-09-11 NOTE — Telephone Encounter (Signed)
 Copied from CRM [id number]. Topic: Clinical - Medication Question >> Sep 11, 2023  9:51 AM Erika Frey T wrote: Reason for CRM: patient called and requested medication refill on all 19 of her meds on 5/1. Not showing that was done. Please advise.

## 2023-09-23 NOTE — Progress Notes (Deleted)
 [place] Favia, CMA,acting as a Neurosurgeon for Susanna Epley, FNP.,have documented all relevant documentation on the behalf of Susanna Epley, FNP,as directed by  Susanna Epley, FNP while in the presence of Susanna Epley, FNP.  Subjective:  Patient ID: Erika Frey , female    DOB: 12/20/1995 , 28 y.o.   MRN: [id number]  No chief complaint on file.   HPI  HPI   Past Medical History:  Diagnosis Date   Absent menses    MOTHER STATES PT STARTED PERIOD AT AGE 57 BUT LAST PERIOD WAS 2 YRS AGO , AGE 60   Asthma    Eczema    Exotropia of left eye    Immunizations up to date    Simple constipation    OCCASIONAL   Sinus headache    Urticaria      Family History  Problem Relation Age of Onset   Hypertension Mother    Diabetes Mother    Heart disease Mother    Sleep apnea Mother    Hypertension Maternal Grandmother    Heart disease Maternal Grandmother    Sleep apnea Other      Current Outpatient Medications:    albuterol (PROVENTIL HFA;VENTOLIN HFA) 108 (90 BASE) MCG/ACT inhaler, Inhale 2 puffs into the lungs every 6 (six) hours as needed., Disp: , Rfl:    Blood Glucose Monitoring Suppl DEVI, 1 each by Does not apply route in the morning, at noon, and at bedtime. May substitute to any manufacturer covered by patient's insurance., Disp: 1 each, Rfl: 0   cetirizine (ZYRTEC) 10 MG tablet, Take 1 tablet by mouth daily., Disp: , Rfl:    Continuous Glucose Sensor (DEXCOM G7 SENSOR) MISC, USE AS DIRECTED TO CHECK BLOOD SUGARS. APPLY NEW ONE AFTER 10 DAYS., Disp: 3 each, Rfl: 0   cyanocobalamin (VITAMIN B12) 100 MCG tablet, Take 100 mcg by mouth daily., Disp: , Rfl:    Fluocinolone  Acetonide 0.01 % OIL, Place 2 drops in ear(s) in the morning and at bedtime., Disp: 20 mL, Rfl: 0   fluticasone (FLONASE) 50 MCG/ACT nasal spray, Place 2 sprays into both nostrils daily., Disp: , Rfl:    Glucagon  (GVOKE HYPOPEN  2-PACK) 0.5 MG/0.1ML SOAJ, Inject 0.1 mLs into the skin as needed., Disp: 0.2 mL, Rfl: 3    glucose blood (ACCU-CHEK GUIDE) test strip, USE EVERY MORNING, AT NOON AND AT BEDTIME, Disp: 100 strip, Rfl: 3   ibuprofen  (ADVIL ) 600 MG tablet, Take 1 tablet (600 mg total) by mouth every 6 (six) hours as needed., Disp: 30 tablet, Rfl: 0   insulin  glargine (LANTUS ) 100 UNIT/ML Solostar Pen, Inject 10 Units into the skin daily., Disp: 15 mL, Rfl: 0   insulin  lispro (HUMALOG ) 100 UNIT/ML KwikPen, Inject 12 Units into the skin 3 (three) times daily., Disp: 15 mL, Rfl: 0   Insulin  Pen Needle (NOVOFINE PLUS PEN NEEDLE) 32G X 4 MM MISC, 1 each by Does not apply route daily., Disp: 100 each, Rfl: 3   montelukast  (SINGULAIR ) 10 MG tablet, Take 1 tablet (10 mg total) by mouth at bedtime., Disp: 30 tablet, Rfl: 5   Multiple Vitamin (MULTIVITAMIN) capsule, Take 1 capsule by mouth daily., Disp: , Rfl:    Olopatadine  HCl (PATADAY ) 0.2 % SOLN, Place 1 drop into both eyes daily as needed., Disp: 2.5 mL, Rfl: 5   ondansetron  (ZOFRAN ) 4 MG tablet, Take 1 tablet (4 mg total) by mouth daily as needed for nausea or vomiting., Disp: 30 tablet, Rfl: 1   Ruxolitinib Phosphate  (  OPZELURA ) 1.5 % CREA, Apply to affected areas twice a day as needed, Disp: 60 g, Rfl: 5   Semaglutide , 1 MG/DOSE, 4 MG/3ML SOPN, Inject 1 mg into the skin once a week., Disp: 9 mL, Rfl: 0   tacrolimus  (PROTOPIC ) 0.1 % ointment, APPLY TOPICALLY 2 TIMES A DAY TO LARGE BODY SURFACE AREA (Patient taking differently: Apply 1 Application topically 2 (two) times daily. 1), Disp: 60 g, Rfl: 2   tolnaftate  (TINACTIN) 1 % spray, Apply topically 2 (two) times daily., Disp: 130 g, Rfl: 1   triamcinolone  cream (KENALOG ) 0.1 %, Apply 1 Application topically 2 (two) times daily., Disp: , Rfl:    Allergies  Allergen Reactions   Penicillin G     Other reaction(s): Unknown   Penicillins Other (See Comments)    Stomach cramps     Review of Systems   There were no vitals filed for this visit. There is no height or weight on file to calculate BMI.  Wt  Readings from Last 3 Encounters:  06/05/23 (!) 346 lb (156.9 kg)  03/06/23 (!) 340 lb 12.8 oz (154.6 kg)  12/24/22 (!) 332 lb (150.6 kg)    The ASCVD Risk score (Arnett DK, et al., 2019) failed to calculate for the following reasons:   The 2019 ASCVD risk score is only valid for ages 63 to 97  Objective:  Physical Exam      Assessment And Plan:  Type 2 diabetes mellitus with hyperglycemia, with long-term current use of insulin  (HCC)    No follow-ups on file.  Patient was given opportunity to ask questions. Patient verbalized understanding of the plan and was able to repeat key elements of the plan. All questions were answered to their satisfaction.    Inge Mangle, FNP, have reviewed all documentation for this visit. The documentation on 09/23/23 for the exam, diagnosis, procedures, and orders are all accurate and complete.   IF YOU HAVE BEEN REFERRED TO A SPECIALIST, IT MAY TAKE 1-2 WEEKS TO SCHEDULE/PROCESS THE REFERRAL. IF YOU HAVE NOT HEARD FROM US /SPECIALIST IN TWO WEEKS, PLEASE GIVE US  A CALL AT [phone].

## 2023-09-24 ENCOUNTER — Ambulatory Visit: Payer: Self-pay | Admitting: Nurse Practitioner

## 2023-10-17 ENCOUNTER — Other Ambulatory Visit: Payer: Self-pay | Admitting: Nurse Practitioner

## 2023-10-17 DIAGNOSIS — E1165 Type 2 diabetes mellitus with hyperglycemia: Secondary | ICD-10-CM

## 2023-10-31 DIAGNOSIS — L03211 Cellulitis of face: Secondary | ICD-10-CM | POA: Diagnosis not present

## 2023-11-05 DIAGNOSIS — Z1159 Encounter for screening for other viral diseases: Secondary | ICD-10-CM | POA: Diagnosis not present

## 2023-11-05 DIAGNOSIS — M545 Low back pain, unspecified: Secondary | ICD-10-CM | POA: Diagnosis not present

## 2023-11-05 DIAGNOSIS — E785 Hyperlipidemia, unspecified: Secondary | ICD-10-CM | POA: Diagnosis not present

## 2023-11-05 DIAGNOSIS — G8929 Other chronic pain: Secondary | ICD-10-CM | POA: Diagnosis not present

## 2023-11-05 DIAGNOSIS — E1165 Type 2 diabetes mellitus with hyperglycemia: Secondary | ICD-10-CM | POA: Diagnosis not present

## 2023-11-19 ENCOUNTER — Encounter: Payer: Self-pay | Admitting: Podiatry

## 2023-11-19 ENCOUNTER — Ambulatory Visit: Admitting: Podiatry

## 2023-11-19 ENCOUNTER — Ambulatory Visit (INDEPENDENT_AMBULATORY_CARE_PROVIDER_SITE_OTHER): Admitting: Podiatry

## 2023-11-19 ENCOUNTER — Ambulatory Visit (INDEPENDENT_AMBULATORY_CARE_PROVIDER_SITE_OTHER)

## 2023-11-19 VITALS — Ht 67.0 in | Wt 346.0 lb

## 2023-11-19 DIAGNOSIS — M7662 Achilles tendinitis, left leg: Secondary | ICD-10-CM | POA: Diagnosis not present

## 2023-11-19 DIAGNOSIS — E782 Mixed hyperlipidemia: Secondary | ICD-10-CM | POA: Diagnosis not present

## 2023-11-19 DIAGNOSIS — E0843 Diabetes mellitus due to underlying condition with diabetic autonomic (poly)neuropathy: Secondary | ICD-10-CM | POA: Diagnosis not present

## 2023-11-19 DIAGNOSIS — M2142 Flat foot [pes planus] (acquired), left foot: Secondary | ICD-10-CM

## 2023-11-19 DIAGNOSIS — M7661 Achilles tendinitis, right leg: Secondary | ICD-10-CM | POA: Diagnosis not present

## 2023-11-19 DIAGNOSIS — D509 Iron deficiency anemia, unspecified: Secondary | ICD-10-CM | POA: Diagnosis not present

## 2023-11-19 DIAGNOSIS — M2141 Flat foot [pes planus] (acquired), right foot: Secondary | ICD-10-CM | POA: Diagnosis not present

## 2023-11-19 DIAGNOSIS — Z0000 Encounter for general adult medical examination without abnormal findings: Secondary | ICD-10-CM | POA: Diagnosis not present

## 2023-11-19 NOTE — Progress Notes (Unsigned)
 Chief Complaint  Patient presents with   Foot Pain    Pt is here due to bilateral foot pain she states that she has been seen here last year for the same issue, she has had several foot and leg surgeries when she was a child,states because of this her feet always hurts, pt has flat feet and is a diabetic and interested in diabetic shoes and referral for physical therapy,    HPI: 28 y.o. female PMHx recently diagnosed diabetes mellitus, A1c was 5.9 on 03/06/2023 presenting today for follow-up evaluation of chronic bilateral foot pain.  History of surgery as a child involving Achilles lengthening bilateral.  She continues to have pain and tenderness on a daily basis with ambulation.  Past Medical History:  Diagnosis Date   Absent menses    MOTHER STATES PT STARTED PERIOD AT AGE 65 BUT LAST PERIOD WAS 2 YRS AGO , AGE 66   Asthma    Eczema    Exotropia of left eye    Immunizations up to date    Simple constipation    OCCASIONAL   Sinus headache    Urticaria     Past Surgical History:  Procedure Laterality Date   FOOT SURGERY  AGE 54   MEDIAN RECTUS REPAIR Left 12/16/2012   Procedure: MEDIAN RECTUS RESECTION LEFT EYE;  Surgeon: Ozell DELENA Mirza, MD;  Location: [facility];  Service: Ophthalmology;  Laterality: Left;   MUSCLE RECESSION AND RESECTION Left 12/16/2012   Procedure: LATERAL RECTUS RECESSION LEFT EYE;  Surgeon: Ozell DELENA Mirza, MD;  Location: [facility];  Service: Ophthalmology;  Laterality: Left;   TONSILLECTOMY AND ADENOIDECTOMY  AGE 51    Allergies  Allergen Reactions   Penicillin G     Other reaction(s): Unknown   Penicillins Other (See Comments)    Stomach cramps     Physical Exam: General: The patient is alert and oriented x3 in no acute distress.  Dermatology: Skin is warm, dry and supple bilateral lower extremities.  Hyperkeratotic skin lesions noted to the bilateral forefoot consistent with patient's given history of eczema.   No open wounds  Vascular: Palpable pedal pulses bilaterally. Capillary refill within normal limits.  No appreciable edema.  No erythema.  Neurological: Light touch and protective threshold diminished  Musculoskeletal Exam: Diffuse generalized tenderness throughout palpation of the bilateral feet.  Collapse of the medial longitudinal arch noted with weightbearing.  There is also tenderness and pain with palpation along the Achilles tendon approximately 8 cm proximal to the insertion at the area of the tendo Achilles lengthening surgical sites  Radiographic Exam B/L feet 11/19/2023:  Mostly unchanged from prior x-rays.  Normal osseous mineralization. Joint spaces preserved.  No fractures or osseous irregularities noted.  There are some mild arch collapse with collapse of the medial longitudinal arch of the foot and a decreased calcaneal inclination angle.  Calcifications noted within the Achilles tendon bilateral approximately 8 cm proximal to the insertion of the calcaneus  Assessment/Plan of Care: 1.  Diabetes mellitus with peripheral polyneuropathy; uncontrolled 2.  Pes planus both 3.  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   X-rays reviewed -Comprehensive diabetic foot exam performed today.  -Continue close management with PCP for diabetes -Advise against going barefoot.  Patient admits to walking around barefoot or only in socks around the house.  Recommend good supportive shoes and sneakers - Referral placed to [facility] orthotics and prosthetics for diabetic  shoes and custom molded Plastizote insoles - Referral also placed to physical therapy at [facility] on [address],  -Return to clinic with me PRN     Thresa EMERSON Sar, DPM [facility]  Dr. Thresa EMERSON Sar, DPM    [address] [place], [place] [address]                Office [phone]  Fax [phone]

## 2024-02-11 DIAGNOSIS — L400 Psoriasis vulgaris: Secondary | ICD-10-CM | POA: Diagnosis not present

## 2024-02-11 DIAGNOSIS — E782 Mixed hyperlipidemia: Secondary | ICD-10-CM | POA: Diagnosis not present

## 2024-02-11 DIAGNOSIS — D509 Iron deficiency anemia, unspecified: Secondary | ICD-10-CM | POA: Diagnosis not present

## 2024-02-11 DIAGNOSIS — L219 Seborrheic dermatitis, unspecified: Secondary | ICD-10-CM | POA: Diagnosis not present

## 2024-02-11 DIAGNOSIS — L309 Dermatitis, unspecified: Secondary | ICD-10-CM | POA: Diagnosis not present

## 2024-02-11 DIAGNOSIS — R0683 Snoring: Secondary | ICD-10-CM | POA: Diagnosis not present

## 2024-02-11 DIAGNOSIS — E1165 Type 2 diabetes mellitus with hyperglycemia: Secondary | ICD-10-CM | POA: Diagnosis not present

## 2024-02-20 ENCOUNTER — Emergency Department (HOSPITAL_BASED_OUTPATIENT_CLINIC_OR_DEPARTMENT_OTHER)
Admission: EM | Admit: 2024-02-20 | Discharge: 2024-02-20 | Disposition: A | Discharge status: Home / Self Care | Attending: Emergency Medicine | Admitting: Emergency Medicine

## 2024-02-20 ENCOUNTER — Encounter (HOSPITAL_BASED_OUTPATIENT_CLINIC_OR_DEPARTMENT_OTHER): Payer: Self-pay

## 2024-02-20 ENCOUNTER — Other Ambulatory Visit: Payer: Self-pay

## 2024-02-20 DIAGNOSIS — J302 Other seasonal allergic rhinitis: Secondary | ICD-10-CM | POA: Diagnosis not present

## 2024-02-20 DIAGNOSIS — J301 Allergic rhinitis due to pollen: Secondary | ICD-10-CM | POA: Insufficient documentation

## 2024-02-20 DIAGNOSIS — J069 Acute upper respiratory infection, unspecified: Secondary | ICD-10-CM | POA: Diagnosis not present

## 2024-02-20 DIAGNOSIS — Z794 Long term (current) use of insulin: Secondary | ICD-10-CM | POA: Diagnosis not present

## 2024-02-20 DIAGNOSIS — R059 Cough, unspecified: Secondary | ICD-10-CM | POA: Insufficient documentation

## 2024-02-20 DIAGNOSIS — B9789 Other viral agents as the cause of diseases classified elsewhere: Secondary | ICD-10-CM | POA: Diagnosis not present

## 2024-02-20 DIAGNOSIS — J029 Acute pharyngitis, unspecified: Secondary | ICD-10-CM | POA: Diagnosis present

## 2024-02-20 LAB — RESP PANEL BY RT-PCR (RSV, FLU A&B, COVID)  RVPGX2
Influenza A by PCR: NEGATIVE
Influenza B by PCR: NEGATIVE
Resp Syncytial Virus by PCR: NEGATIVE
SARS Coronavirus 2 by RT PCR: NEGATIVE

## 2024-02-20 LAB — GROUP A STREP BY PCR: Group A Strep by PCR: NOT DETECTED

## 2024-02-20 NOTE — ED Notes (Signed)
 Pt d/c instructions, medications, and follow-up care reviewed with pt. Pt verbalized understanding and had no further questions at time of d/c. Pt CA&Ox4, ambulatory, and in NAD at time of d/c

## 2024-02-20 NOTE — ED Provider Notes (Signed)
 [facility] AT [facility] Provider Note   CSN: [id number] Arrival date & time: 02/20/24  9056     Patient presents with: Flu-Like Symptoms, Otalgia, Nasal Congestion, and Sore Throat   Erika Frey is a 28 y.o. female whose primary concern at the ED today is nasal congestion along with sore throat and bilateral otalgia.  She does have a history of eczematous otitis externa, previously had used eardrops but states that she no longer has this.  Has also been followed by otolaryngology for serous effusion of the bilateral ears.  Further, she states that she has significant nasal congestion, worsens with oral intake, is managed for seasonal allergic rhinitis with cetirizine and Singulair .  She has a mild intermittent cough but states that she does not have any significant dyspnea.  Concerned as a younger sibling at home has been recently ill, believes she may have contracted a viral infection.  Specifically asks if antibiotics would be indicated as she has had chronic sinusitis.    Otalgia Associated symptoms: congestion, cough and sore throat   Sore Throat Pertinent negatives include no shortness of breath.       Prior to Admission medications   Medication Sig Start Date End Date Taking? Authorizing Provider  albuterol (PROVENTIL HFA;VENTOLIN HFA) 108 (90 BASE) MCG/ACT inhaler Inhale 2 puffs into the lungs every 6 (six) hours as needed.    [provider]  Blood Glucose Monitoring Suppl DEVI 1 each by Does not apply route in the morning, at noon, and at bedtime. May substitute to any manufacturer covered by patient's insurance. 11/03/22   Keith Sor, PA-C  cetirizine (ZYRTEC) 10 MG tablet Take 1 tablet by mouth daily. 05/30/20   [provider]  Continuous Glucose Sensor (DEXCOM G7 SENSOR) MISC USE AS DIRECTED TO CHECK BLOOD SUGARS. APPLY NEW SENSOR AFTER 10 DAYS. 10/20/23   Moore, Janece, FNP  cyanocobalamin (VITAMIN B12) 100 MCG tablet Take  100 mcg by mouth daily.    [provider]  Fluocinolone  Acetonide 0.01 % OIL Place 2 drops in ear(s) in the morning and at bedtime. 06/05/23   Petrina Pries, NP  fluticasone (FLONASE) 50 MCG/ACT nasal spray Place 2 sprays into both nostrils daily. 12/14/20   [provider]  Glucagon  (GVOKE HYPOPEN  2-PACK) 0.5 MG/0.1ML SOAJ Inject 0.1 mLs into the skin as needed. 12/24/22   Georgina Speaks, FNP  glucose blood (ACCU-CHEK GUIDE) test strip USE EVERY MORNING, AT NOON AND AT BEDTIME 02/19/23   Moore, Janece, FNP  ibuprofen  (ADVIL ) 600 MG tablet TAKE 1 TABLET BY MOUTH EVERY 6 HOURS AS NEEDED 10/20/23   Georgina Speaks, FNP  insulin  lispro (HUMALOG ) 100 UNIT/ML KwikPen INJECT 12 UNITS into THE SKIN 3 TIMES DAILY 10/20/23   Georgina Speaks, FNP  Insulin  Pen Needle (NOVOFINE PLUS PEN NEEDLE) 32G X 4 MM MISC 1 each by Does not apply route daily. 11/05/22   Georgina Speaks, FNP  LANTUS  SOLOSTAR 100 UNIT/ML Solostar Pen inject 10 UNITS into THE SKIN DAILY 10/20/23   Georgina Speaks, FNP  montelukast  (SINGULAIR ) 10 MG tablet Take 1 tablet (10 mg total) by mouth at bedtime. 03/21/22   Jeneal Danita Macintosh, MD  Multiple Vitamin (MULTIVITAMIN) capsule Take 1 capsule by mouth daily.    [provider]  Olopatadine  HCl (PATADAY ) 0.2 % SOLN Place 1 drop into both eyes daily as needed. 03/21/22   Jeneal Danita Macintosh, MD  OZEMPIC , 1 MG/DOSE, 4 MG/3ML SOPN Inject 1 mg into the skin once a week.  10/20/23   Georgina Speaks, FNP  Ruxolitinib Phosphate  (OPZELURA ) 1.5 % CREA Apply to affected areas twice a day as needed 03/21/22   Jeneal Danita Macintosh, MD  tacrolimus  (PROTOPIC ) 0.1 % ointment APPLY TOPICALLY 2 TIMES A DAY TO LARGE BODY SURFACE AREA Patient taking differently: Apply 1 Application topically 2 (two) times daily. 1 09/03/21   Sheffield, Kelli R, PA-C  tolnaftate  (TINACTIN) 1 % spray Apply topically 2 (two) times daily. 12/24/22   Georgina Speaks, FNP  triamcinolone  cream (KENALOG ) 0.1 % Apply 1  Application topically 2 (two) times daily. 07/17/20   [provider]    Allergies: Penicillin g and Penicillins    Review of Systems  HENT:  Positive for congestion, ear pain, sinus pressure, sinus pain and sore throat.   Respiratory:  Positive for cough. Negative for shortness of breath.   All other systems reviewed and are negative.   Updated Vital Signs BP 115/71 (BP Location: Left Arm)   Pulse 87   Temp 98.3 F (36.8 C) (Oral)   Resp 20   SpO2 100%   Physical Exam Vitals and nursing note reviewed.  Constitutional:      General: She is not in acute distress.    Appearance: Normal appearance. She is well-developed. She is obese.  HENT:     Head: Normocephalic and atraumatic.     Right Ear: Hearing, tympanic membrane and external ear normal. No middle ear effusion.     Left Ear: Hearing, tympanic membrane and external ear normal.  No middle ear effusion.     Ears:     Comments: Bilateral external canals show significant erythema as well as scaling.    Mouth/Throat:     Mouth: Mucous membranes are moist.     Pharynx: Oropharynx is clear.  Eyes:     Extraocular Movements: Extraocular movements intact.     Conjunctiva/sclera: Conjunctivae normal.     Pupils: Pupils are equal, round, and reactive to light.  Cardiovascular:     Rate and Rhythm: Normal rate and regular rhythm.     Pulses: Normal pulses.     Heart sounds: Normal heart sounds. No murmur heard.    No friction rub. No gallop.  Pulmonary:     Effort: Pulmonary effort is normal.     Breath sounds: Normal breath sounds.  Abdominal:     General: Abdomen is flat. Bowel sounds are normal.     Palpations: Abdomen is soft.  Musculoskeletal:        General: Normal range of motion.     Cervical back: Normal range of motion and neck supple.     Right lower leg: No edema.     Left lower leg: No edema.  Skin:    General: Skin is warm and dry.     Capillary Refill: Capillary refill takes less than 2 seconds.   Neurological:     General: No focal deficit present.     Mental Status: She is alert. Mental status is at baseline.  Psychiatric:        Mood and Affect: Mood normal.     (all labs ordered are listed, but only abnormal results are displayed) Labs Reviewed  GROUP A STREP BY PCR  RESP PANEL BY RT-PCR (RSV, FLU A&B, COVID)  [id number]    EKG: None  Radiology: No results found.   Procedures   Medications Ordered in the ED - No data to display  Medical Decision Making  Medical Decision Making:   Erika Pierre and [place] Jarecki is a 28 y.o. female who presented to the ED today with significant congestion as well as sore throat detailed above.    Additional history discussed with patient's family/caregivers.  External chart has been reviewed including previous outpatient visit notes.. Complete initial physical exam performed, notably the patient  was alert and oriented in no apparent distress.  There is significant mucosal edema in the bilateral nares, left-sided anterior septal deviation is appreciated, there is also mild maxillofacial tenderness as well as paranasal tenderness.    Reviewed and confirmed nursing documentation for past medical history, family history, social history.    Initial Assessment:   With the patient's presentation of congestion and sore throat, most likely diagnosis is viral URI. Other diagnoses were considered including (but not limited to) influenza, chronic sinusitis, acute sinusitis. These are considered less likely due to history of present illness and physical exam findings.     Initial Plan:  Obtain oral swab to evaluate for streptococcal pharyngitis Obtain nasopharyngeal swab to assess for viral etiology, flu/COVID/RSV. Objective evaluation as below reviewed   Initial Study Results:   Laboratory  All laboratory results reviewed without evidence of clinically relevant pathology.   Exceptions include: None  Reassessment and Plan:     Physical exam as well as history of present illness, believe this is secondary to an unspecified viral URI has streptococcal pharyngitis as well as COVID/flu/RSV have been ruled out by lab assessment.  There is significant mucosal edema that may also be exacerbated by her seasonal allergic rhinitis, she continues to take cetirizine and montelukast  for same.  Plan at this time is for her to continue to use supportive measures at home for symptomatic care, continue to use her regular regimen for her allergic rhinitis, can also add oxymetazoline but had careful discussion with patient this do not exceed 3 to 5 days use to prevent rebound congestion.  Reassurance provided, careful return precautions given, as vital signs are stable within normal limits, and there is no significant physical exam findings that would signal concerning etiology, will discharge with outpatient management and supportive care.       Final diagnoses:  Viral URI  Seasonal allergic rhinitis due to pollen    ED Discharge Orders     None          Myriam Dorn BROCKS, PA 02/20/24 1127    Patsey Lot, MD 02/21/24 [phone]

## 2024-02-20 NOTE — Discharge Instructions (Signed)
 As discussed, please do not use Afrin spray for more than 3 to 5 days to prevent serious rebound congestion.  You can continue to use the saline rinses as needed, you can do this as on limited as it has no negative side effects.,  Continue to use your regular regimen of your allergy medications during this time, viral infections such as what we suspect is the cause of your symptoms today we will take about another week before you see significant improvement.  Follow-up with your primary care as needed.

## 2024-02-20 NOTE — ED Triage Notes (Signed)
 Patient reports nasal congestion, left otalgia, sore throat, some nausea for 2 days now. Reports has a younger sibling at home who brought home an illness from school.

## 2024-03-01 ENCOUNTER — Other Ambulatory Visit: Payer: Self-pay | Admitting: Nurse Practitioner

## 2024-03-01 DIAGNOSIS — Z794 Long term (current) use of insulin: Secondary | ICD-10-CM

## 2024-03-03 NOTE — Progress Notes (Signed)
 Eowyn Hoff                                          MRN: [id number]   03/03/2024   The [facility] Quality Team Specialist reviewed this patient medical record for the purposes of chart review for care gap closure. The following were reviewed: chart review for care gap closure-glycemic status assessment and kidney health evaluation for diabetes:eGFR  and uACR.    [facility]BCI Quality Team

## 2024-03-19 ENCOUNTER — Ambulatory Visit

## 2024-03-19 NOTE — Patient Instructions (Signed)

## 2024-04-28 ENCOUNTER — Ambulatory Visit: Payer: Self-pay

## 2024-04-28 DIAGNOSIS — Z0000 Encounter for general adult medical examination without abnormal findings: Secondary | ICD-10-CM

## 2024-04-28 NOTE — Progress Notes (Signed)
 "  No chief complaint on file.    Subjective:   Erika Frey is a 28 y.o. female who presents for a Medicare Annual Wellness Visit.  Fall Screening Patient Fall Risk Level: Low fall risk  Advance Directives (For Healthcare) Does Patient Have a Medical Advance Directive?: No    Allergies (verified) Penicillin g and Penicillins   Current Medications (verified) Outpatient Encounter Medications as of 04/28/2024  Medication Sig   albuterol (PROVENTIL HFA;VENTOLIN HFA) 108 (90 BASE) MCG/ACT inhaler Inhale 2 puffs into the lungs every 6 (six) hours as needed.   Blood Glucose Monitoring Suppl DEVI 1 each by Does not apply route in the morning, at noon, and at bedtime. May substitute to any manufacturer covered by patient's insurance.   cetirizine (ZYRTEC) 10 MG tablet Take 1 tablet by mouth daily.   Continuous Glucose Sensor (DEXCOM G7 SENSOR) MISC USE AS DIRECTED TO CHECK BLOOD SUGARS. APPLY NEW SENSOR AFTER 10 DAYS.   cyanocobalamin (VITAMIN B12) 100 MCG tablet Take 100 mcg by mouth daily.   Fluocinolone  Acetonide 0.01 % OIL Place 2 drops in ear(s) in the morning and at bedtime.   fluticasone (FLONASE) 50 MCG/ACT nasal spray Place 2 sprays into both nostrils daily.   Glucagon  (GVOKE HYPOPEN  2-PACK) 0.5 MG/0.1ML SOAJ Inject 0.1 mLs into the skin as needed.   glucose blood (ACCU-CHEK GUIDE) test strip USE EVERY MORNING, AT NOON AND AT BEDTIME   ibuprofen  (ADVIL ) 600 MG tablet TAKE 1 TABLET BY MOUTH EVERY 6 HOURS AS NEEDED   insulin  lispro (HUMALOG ) 100 UNIT/ML KwikPen INJECT 12 UNITS into THE SKIN 3 TIMES DAILY   Insulin  Pen Needle (NOVOFINE PLUS PEN NEEDLE) 32G X 4 MM MISC 1 each by Does not apply route daily.   LANTUS  SOLOSTAR 100 UNIT/ML Solostar Pen inject 10 UNITS into THE SKIN DAILY   montelukast  (SINGULAIR ) 10 MG tablet Take 1 tablet (10 mg total) by mouth at bedtime.   Multiple Vitamin (MULTIVITAMIN) capsule Take 1 capsule by mouth daily.   Olopatadine  HCl (PATADAY ) 0.2 % SOLN  Place 1 drop into both eyes daily as needed.   OZEMPIC , 1 MG/DOSE, 4 MG/3ML SOPN INJECT 1MG  INTO THE SKIN ONCE WEEKLY.   Ruxolitinib Phosphate  (OPZELURA ) 1.5 % CREA Apply to affected areas twice a day as needed   tacrolimus  (PROTOPIC ) 0.1 % ointment APPLY TOPICALLY 2 TIMES A DAY TO LARGE BODY SURFACE AREA (Patient taking differently: Apply 1 Application topically 2 (two) times daily. 1)   tolnaftate  (TINACTIN) 1 % spray Apply topically 2 (two) times daily.   triamcinolone  cream (KENALOG ) 0.1 % Apply 1 Application topically 2 (two) times daily.   No facility-administered encounter medications on file as of 04/28/2024.    History: Past Medical History:  Diagnosis Date   Absent menses    MOTHER STATES PT STARTED PERIOD AT AGE 55 BUT LAST PERIOD WAS 2 YRS AGO , AGE 89   Asthma    Eczema    Exotropia of left eye    Immunizations up to date    Simple constipation    OCCASIONAL   Sinus headache    Urticaria    Past Surgical History:  Procedure Laterality Date   FOOT SURGERY  AGE 76   MEDIAN RECTUS REPAIR Left 12/16/2012   Procedure: MEDIAN RECTUS RESECTION LEFT EYE;  Surgeon: Ozell DELENA Mirza, MD;  Location[facility];  Service: Ophthalmology;  Laterality: Left;   MUSCLE RECESSION AND RESECTION Left 12/16/2012   Procedure: LATERAL RECTUS RECESSION LEFT  EYE;  Surgeon: Ozell DELENA Mirza, MD;  Location: [facility];  Service: Ophthalmology;  Laterality: Left;   TONSILLECTOMY AND ADENOIDECTOMY  AGE 20   Family History  Problem Relation Age of Onset   Hypertension Mother    Diabetes Mother    Heart disease Mother    Sleep apnea Mother    Hypertension Maternal Grandmother    Heart disease Maternal Grandmother    Sleep apnea Other    Social History   Occupational History   Occupation: self   Tobacco Use   Smoking status: Never   Smokeless tobacco: Never   Tobacco comments:    NO SMOKER IN HOME  Vaping Use   Vaping status: Never Used  Substance and  Sexual Activity   Alcohol use: No    Alcohol/week: 0.0 standard drinks of alcohol   Drug use: No   Sexual activity: Yes    Birth control/protection: Pill   Tobacco Counseling Counseling given: Not Answered Tobacco comments: NO SMOKER IN HOME  SDOH Screenings   Food Insecurity: No Food Insecurity (01/16/2023)  Housing: Low Risk (01/16/2023)  Transportation Needs: No Transportation Needs (01/16/2023)  Utilities: Not At Risk (01/16/2023)  Alcohol Screen: Low Risk (01/16/2023)  Depression (PHQ2-9): Low Risk (01/16/2023)  Financial Resource Strain: Low Risk (01/16/2023)  Physical Activity: Inactive (01/16/2023)  Social Connections: Moderately Isolated (01/16/2023)  Stress: No Stress Concern Present (01/16/2023)  Tobacco Use: Low Risk (02/20/2024)  Health Literacy: Adequate Health Literacy (01/16/2023)   See flowsheets for full screening details  Depression Screen     Goals Addressed   None          Objective:    There were no vitals filed for this visit. There is no height or weight on file to calculate BMI.  Hearing/Vision screen No results found. Immunizations and Health Maintenance Health Maintenance  Topic Date Due   Pneumococcal Vaccine (1 of 2 - PCV) Never done   Hepatitis B Vaccines 19-59 Average Risk (1 of 3 - 19+ 3-dose series) Never done   HEMOGLOBIN A1C  09/03/2023   Diabetic kidney evaluation - Urine ACR  11/05/2023   Influenza Vaccine  Never done   FOOT EXAM  12/24/2023   COVID-19 Vaccine (5 - 2025-26 season) 01/05/2024   Diabetic kidney evaluation - eGFR measurement  03/05/2024   OPHTHALMOLOGY EXAM  04/06/2024   Cervical Cancer Screening (Pap smear)  01/28/2025   Medicare Annual Wellness (AWV)  04/28/2025   DTaP/Tdap/Td (3 - Td or Tdap) 11/03/2030   HPV VACCINES  Completed   Hepatitis C Screening  Completed   HIV Screening  Completed   Meningococcal B Vaccine  Aged Out        Assessment/Plan:  This is a routine wellness examination for  Erika Frey.  Patient Care Team: Porter Andrez SAUNDERS, PA-C (Inactive) as Physician Assistant (Dermatology)  I have personally reviewed and noted the following in the patients chart:   Medical and social history Use of alcohol, tobacco or illicit drugs  Current medications and supplements including opioid prescriptions. Functional ability and status Nutritional status Physical activity Advanced directives List of other physicians Hospitalizations, surgeries, and ER visits in previous 12 months Vitals Screenings to include cognitive, depression, and falls Referrals and appointments  No orders of the defined types were placed in this encounter.  In addition, I have reviewed and discussed with patient certain preventive protocols, quality metrics, and best practice recommendations. A written personalized care plan for preventive services as well as general preventive health  recommendations were provided to patient.   Shona List   04/28/2024   Return in 1 year (on 04/28/2025).  After Visit Summary: (Declined) Due to this being a telephonic visit, with patients personalized plan was offered to patient but patient Declined AVS at this time   Nurse Notes:  "

## 2024-05-18 ENCOUNTER — Encounter: Admitting: Obstetrics and Gynecology

## 2024-05-21 NOTE — Patient Instructions (Signed)
 I connected with  Erika Frey on 05/21/24 by a audio enabled telemedicine application and verified that I am speaking with the correct person using two identifiers.  Patient Location: Home  Provider Location: Office/Clinic  Persons Participating in Visit: Patient.  I discussed the limitations of evaluation and management by telemedicine. The patient expressed understanding and agreed to proceed.   Vital Signs: Because this visit was a virtual/telehealth visit, some criteria may be missing or patient reported. Any vitals not documented were not able to be obtained and vitals that have been documented are patient reported.

## 2024-06-09 NOTE — Progress Notes (Cosign Needed Addendum)
 "  Chief Complaint  Patient presents with   Medicare Wellness     Subjective:   Erika Frey is a 29 y.o. female who presents for a Medicare Annual Wellness Visit.  Visit info / Clinical Intake: Medicare Wellness Visit Type:: Subsequent Annual Wellness Visit Persons participating in visit and providing information:: patient Medicare Wellness Visit Mode:: Telephone If telephone:: video declined Since this visit was completed virtually, some vitals may be partially provided or unavailable. Missing vitals are due to the limitations of the virtual format.: Unable to obtain vitals - no equipment If Telephone or Video please confirm:: I connected with patient using audio/video enable telemedicine. I verified patient identity with two identifiers, discussed telehealth limitations, and patient agreed to proceed. Patient Location:: home Provider Location:: office Interpreter Needed?: No Pre-visit prep was completed: no AWV questionnaire completed by patient prior to visit?: no Living arrangements:: lives with spouse/significant other Patient's Overall Health Status Rating: (!) fair Typical amount of pain: some Does pain affect daily life?: (!) yes Are you currently prescribed opioids?: no  Dietary Habits and Nutritional Risks How many meals a day?: 3 Eats fruit and vegetables daily?: yes Most meals are obtained by: preparing own meals In the last 2 weeks, have you had any of the following?: none Diabetic:: (!) yes Any non-healing wounds?: no How often do you check your BS?: continuous glucose monitor Would you like to be referred to a Nutritionist or for Diabetic Management? : no  Functional Status Activities of Daily Living (to include ambulation/medication): Independent Ambulation: Independent Medication Administration: Independent Home Management (perform basic housework or laundry): Independent Manage your own finances?: yes Primary transportation is: driving Concerns about  vision?: no *vision screening is required for WTM* Concerns about hearing?: no  Fall Screening Falls in the past year?: 0 Number of falls in past year: 0 Was there an injury with Fall?: 0 Fall Risk Category Calculator: 0 Patient Fall Risk Level: Low Fall Risk  Fall Risk Patient at Risk for Falls Due to: No Fall Risks Fall risk Follow up: Falls evaluation completed  Home and Transportation Safety: All rugs have non-skid backing?: (!) no All stairs or steps have railings?: N/A, no stairs Grab bars in the bathtub or shower?: yes Have non-skid surface in bathtub or shower?: yes Good home lighting?: yes Hospital stays in the last year:: no  Cognitive Assessment Difficulty concentrating, remembering, or making decisions? : no Will 6CIT or Mini Cog be Completed: yes What year is it?: 0 points What month is it?: 0 points Give patient an address phrase to remember (5 components): [address] [place] About what time is it?: 3 points Count backwards from 20 to 1: 0 points Say the months of the year in reverse: 0 points Repeat the address phrase from earlier: 0 points 6 CIT Score: 3 points  Advance Directives (For Healthcare) Does Patient Have a Medical Advance Directive?: No Would patient like information on creating a medical advance directive?: No - Patient declined  Reviewed/Updated  Reviewed/Updated: Reviewed All (Medical, Surgical, Family, Medications, Allergies, Care Teams, Patient Goals)    Allergies (verified) Penicillin g and Penicillins   Current Medications (verified) Outpatient Encounter Medications as of 04/28/2024  Medication Sig   albuterol (PROVENTIL HFA;VENTOLIN HFA) 108 (90 BASE) MCG/ACT inhaler Inhale 2 puffs into the lungs every 6 (six) hours as needed.   Blood Glucose Monitoring Suppl DEVI 1 each by Does not apply route in the morning, at noon, and at bedtime. May substitute to any manufacturer  covered by patient's insurance.   cetirizine (ZYRTEC) 10  MG tablet Take 1 tablet by mouth daily.   Continuous Glucose Sensor (DEXCOM G7 SENSOR) MISC USE AS DIRECTED TO CHECK BLOOD SUGARS. APPLY NEW SENSOR AFTER 10 DAYS.   cyanocobalamin (VITAMIN B12) 100 MCG tablet Take 100 mcg by mouth daily.   Fluocinolone  Acetonide 0.01 % OIL Place 2 drops in ear(s) in the morning and at bedtime.   fluticasone (FLONASE) 50 MCG/ACT nasal spray Place 2 sprays into both nostrils daily.   Glucagon  (GVOKE HYPOPEN  2-PACK) 0.5 MG/0.1ML SOAJ Inject 0.1 mLs into the skin as needed.   glucose blood (ACCU-CHEK GUIDE) test strip USE EVERY MORNING, AT NOON AND AT BEDTIME   ibuprofen  (ADVIL ) 600 MG tablet TAKE 1 TABLET BY MOUTH EVERY 6 HOURS AS NEEDED   insulin  lispro (HUMALOG ) 100 UNIT/ML KwikPen INJECT 12 UNITS into THE SKIN 3 TIMES DAILY   Insulin  Pen Needle (NOVOFINE PLUS PEN NEEDLE) 32G X 4 MM MISC 1 each by Does not apply route daily.   LANTUS  SOLOSTAR 100 UNIT/ML Solostar Pen inject 10 UNITS into THE SKIN DAILY   montelukast  (SINGULAIR ) 10 MG tablet Take 1 tablet (10 mg total) by mouth at bedtime.   Multiple Vitamin (MULTIVITAMIN) capsule Take 1 capsule by mouth daily.   Olopatadine  HCl (PATADAY ) 0.2 % SOLN Place 1 drop into both eyes daily as needed.   OZEMPIC , 1 MG/DOSE, 4 MG/3ML SOPN INJECT 1MG  INTO THE SKIN ONCE WEEKLY.   Ruxolitinib Phosphate  (OPZELURA ) 1.5 % CREA Apply to affected areas twice a day as needed   tacrolimus  (PROTOPIC ) 0.1 % ointment APPLY TOPICALLY 2 TIMES A DAY TO LARGE BODY SURFACE AREA (Patient taking differently: Apply 1 Application topically 2 (two) times daily. 1)   tolnaftate  (TINACTIN) 1 % spray Apply topically 2 (two) times daily.   triamcinolone  cream (KENALOG ) 0.1 % Apply 1 Application topically 2 (two) times daily.   No facility-administered encounter medications on file as of 04/28/2024.    History: Past Medical History:  Diagnosis Date   Absent menses    MOTHER STATES PT STARTED PERIOD AT AGE 63 BUT LAST PERIOD WAS 2 YRS AGO , AGE  26   Asthma    Eczema    Exotropia of left eye    Immunizations up to date    Simple constipation    OCCASIONAL   Sinus headache    Urticaria    Past Surgical History:  Procedure Laterality Date   FOOT SURGERY  AGE 46   MEDIAN RECTUS REPAIR Left 12/16/2012   Procedure: MEDIAN RECTUS RESECTION LEFT EYE;  Surgeon: Ozell DELENA Mirza, MD;  [facility];  Service: Ophthalmology;  Laterality: Left;   MUSCLE RECESSION AND RESECTION Left 12/16/2012   Procedure: LATERAL RECTUS RECESSION LEFT EYE;  Surgeon: Ozell DELENA Mirza, MD;  Location: [facility];  Service: Ophthalmology;  Laterality: Left;   TONSILLECTOMY AND ADENOIDECTOMY  AGE 6   Family History  Problem Relation Age of Onset   Hypertension Mother    Diabetes Mother    Heart disease Mother    Sleep apnea Mother    Hypertension Maternal Grandmother    Heart disease Maternal Grandmother    Sleep apnea Other    Social History   Occupational History   Occupation: self   Tobacco Use   Smoking status: Never   Smokeless tobacco: Never   Tobacco comments:    NO SMOKER IN HOME  Vaping Use   Vaping status: Never  Used  Substance and Sexual Activity   Alcohol use: No    Alcohol/week: 0.0 standard drinks of alcohol   Drug use: No   Sexual activity: Yes    Birth control/protection: Pill   Tobacco Counseling Counseling given: Not Answered Tobacco comments: NO SMOKER IN HOME  SDOH Screenings   Food Insecurity: No Food Insecurity (01/16/2023)  Housing: Low Risk (01/16/2023)  Transportation Needs: No Transportation Needs (01/16/2023)  Utilities: Not At Risk (01/16/2023)  Alcohol Screen: Low Risk (01/16/2023)  Depression (PHQ2-9): Low Risk (04/28/2024)  Financial Resource Strain: Low Risk (01/16/2023)  Physical Activity: Inactive (04/28/2024)  Social Connections: Moderately Isolated (04/28/2024)  Stress: No Stress Concern Present (04/28/2024)  Tobacco Use: Low Risk (04/28/2024)  Health  Literacy: Adequate Health Literacy (01/16/2023)   See flowsheets for full screening details  Depression Screen PHQ 2 & 9 Depression Scale- Over the past 2 weeks, how often have you been bothered by any of the following problems? Little interest or pleasure in doing things: 0 Feeling down, depressed, or hopeless (PHQ Adolescent also includes...irritable): 0 PHQ-2 Total Score: 0     Goals Addressed             This Visit's Progress    working on her business.               Objective:    Today's Vitals   There is no height or weight on file to calculate BMI.  Hearing/Vision screen No results found. Immunizations and Health Maintenance Health Maintenance  Topic Date Due   Pneumococcal Vaccine (1 of 2 - PCV) Never done   Hepatitis B Vaccines 19-59 Average Risk (1 of 3 - 19+ 3-dose series) Never done   HEMOGLOBIN A1C  09/03/2023   Diabetic kidney evaluation - Urine ACR  11/05/2023   Influenza Vaccine  Never done   FOOT EXAM  12/24/2023   COVID-19 Vaccine (5 - 2025-26 season) 01/05/2024   Diabetic kidney evaluation - eGFR measurement  03/05/2024   OPHTHALMOLOGY EXAM  04/06/2024   Cervical Cancer Screening (Pap smear)  01/28/2025   Medicare Annual Wellness (AWV)  04/28/2025   DTaP/Tdap/Td (3 - Td or Tdap) 11/03/2030   HPV VACCINES  Completed   Hepatitis C Screening  Completed   HIV Screening  Completed   Meningococcal B Vaccine  Aged Out        Assessment/Plan:  This is a routine wellness examination for Nasiya.  Patient Care Team: Porter Andrez SAUNDERS, PA-C (Inactive) as Physician Assistant (Dermatology)  I have personally reviewed and noted the following in the patients chart:   Medical and social history Use of alcohol, tobacco or illicit drugs  Current medications and supplements including opioid prescriptions. Functional ability and status Nutritional status Physical activity Advanced directives List of other physicians Hospitalizations, surgeries,  and ER visits in previous 12 months Vitals Screenings to include cognitive, depression, and falls Referrals and appointments  No orders of the defined types were placed in this encounter.  In addition, I have reviewed and discussed with patient certain preventive protocols, quality metrics, and best practice recommendations. A written personalized care plan for preventive services as well as general preventive health recommendations were provided to patient.   Kristeen JINNY Lunger, CMA  04/28/2024 Return in 1 year (on 04/28/2025).  After Visit Summary: (MyChart) Due to this being a telephonic visit, the after visit summary with patients personalized plan was offered to patient via MyChart   Nurse Notes:  "

## 2024-06-09 NOTE — Addendum Note (Signed)
 Addended by: GLADIS KRISTEEN PARAS on: 06/09/2024 01:35 PM   Modules accepted: Orders

## 2024-06-10 ENCOUNTER — Encounter (HOSPITAL_BASED_OUTPATIENT_CLINIC_OR_DEPARTMENT_OTHER): Payer: Self-pay | Admitting: Pulmonary Disease

## 2024-06-10 DIAGNOSIS — G4700 Insomnia, unspecified: Secondary | ICD-10-CM

## 2024-06-10 DIAGNOSIS — R0683 Snoring: Secondary | ICD-10-CM

## 2024-06-10 DIAGNOSIS — G4710 Hypersomnia, unspecified: Secondary | ICD-10-CM

## 2024-06-10 DIAGNOSIS — R5383 Other fatigue: Secondary | ICD-10-CM

## 2024-06-17 ENCOUNTER — Encounter: Admitting: Obstetrics and Gynecology
